# Patient Record
Sex: Female | Born: 1950
Health system: Southern US, Community
[De-identification: ages and names within clinical notes are randomized; demographics above are authoritative.]

## PROBLEM LIST (undated history)

## (undated) DIAGNOSIS — I509 Heart failure, unspecified: Secondary | ICD-10-CM

## (undated) DIAGNOSIS — R011 Cardiac murmur, unspecified: Secondary | ICD-10-CM

## (undated) DIAGNOSIS — R519 Headache, unspecified: Secondary | ICD-10-CM

## (undated) DIAGNOSIS — E1140 Type 2 diabetes mellitus with diabetic neuropathy, unspecified: Secondary | ICD-10-CM

## (undated) DIAGNOSIS — R51 Headache: Secondary | ICD-10-CM

## (undated) DIAGNOSIS — I739 Peripheral vascular disease, unspecified: Secondary | ICD-10-CM

## (undated) DIAGNOSIS — M797 Fibromyalgia: Secondary | ICD-10-CM

## (undated) DIAGNOSIS — IMO0001 Reserved for inherently not codable concepts without codable children: Secondary | ICD-10-CM

## (undated) DIAGNOSIS — K219 Gastro-esophageal reflux disease without esophagitis: Secondary | ICD-10-CM

## (undated) DIAGNOSIS — M6740 Ganglion, unspecified site: Secondary | ICD-10-CM

## (undated) DIAGNOSIS — J449 Chronic obstructive pulmonary disease, unspecified: Secondary | ICD-10-CM

## (undated) DIAGNOSIS — Z9641 Presence of insulin pump (external) (internal): Secondary | ICD-10-CM

## (undated) DIAGNOSIS — J9690 Respiratory failure, unspecified, unspecified whether with hypoxia or hypercapnia: Secondary | ICD-10-CM

## (undated) DIAGNOSIS — I10 Essential (primary) hypertension: Secondary | ICD-10-CM

## (undated) DIAGNOSIS — E119 Type 2 diabetes mellitus without complications: Secondary | ICD-10-CM

## (undated) HISTORY — PX: MULTIPLE TOOTH EXTRACTIONS: SHX2053

## (undated) HISTORY — PX: CARPAL TUNNEL RELEASE: SHX101

## (undated) HISTORY — PX: FEMORAL-FEMORAL BYPASS GRAFT: SHX936

---

## 2000-11-21 ENCOUNTER — Encounter: Admission: RE | Admit: 2000-11-21 | Discharge: 2000-11-21 | Payer: Self-pay | Admitting: Obstetrics & Gynecology

## 2001-04-05 ENCOUNTER — Encounter (INDEPENDENT_AMBULATORY_CARE_PROVIDER_SITE_OTHER): Payer: Self-pay | Admitting: *Deleted

## 2001-04-05 ENCOUNTER — Encounter: Admission: RE | Admit: 2001-04-05 | Discharge: 2001-04-05 | Payer: Self-pay | Admitting: *Deleted

## 2001-04-05 ENCOUNTER — Other Ambulatory Visit: Admission: RE | Admit: 2001-04-05 | Discharge: 2001-04-05 | Payer: Self-pay | Admitting: *Deleted

## 2001-04-06 ENCOUNTER — Encounter: Admission: RE | Admit: 2001-04-06 | Discharge: 2001-04-06 | Payer: Self-pay | Admitting: Internal Medicine

## 2001-04-16 ENCOUNTER — Ambulatory Visit (HOSPITAL_COMMUNITY): Admission: RE | Admit: 2001-04-16 | Discharge: 2001-04-16 | Payer: Self-pay | Admitting: *Deleted

## 2001-05-08 ENCOUNTER — Encounter: Admission: RE | Admit: 2001-05-08 | Discharge: 2001-05-08 | Payer: Self-pay | Admitting: *Deleted

## 2001-06-22 ENCOUNTER — Encounter: Payer: Self-pay | Admitting: Internal Medicine

## 2001-06-22 ENCOUNTER — Encounter: Admission: RE | Admit: 2001-06-22 | Discharge: 2001-06-22 | Payer: Self-pay | Admitting: Internal Medicine

## 2001-06-22 ENCOUNTER — Ambulatory Visit (HOSPITAL_COMMUNITY): Admission: RE | Admit: 2001-06-22 | Discharge: 2001-06-22 | Payer: Self-pay | Admitting: Internal Medicine

## 2001-08-07 ENCOUNTER — Encounter (INDEPENDENT_AMBULATORY_CARE_PROVIDER_SITE_OTHER): Payer: Self-pay | Admitting: *Deleted

## 2001-08-07 ENCOUNTER — Encounter (INDEPENDENT_AMBULATORY_CARE_PROVIDER_SITE_OTHER): Payer: Self-pay | Admitting: Internal Medicine

## 2001-08-07 ENCOUNTER — Encounter: Admission: RE | Admit: 2001-08-07 | Discharge: 2001-08-07 | Payer: Self-pay | Admitting: *Deleted

## 2001-09-05 ENCOUNTER — Encounter: Admission: RE | Admit: 2001-09-05 | Discharge: 2001-09-05 | Payer: Self-pay | Admitting: Internal Medicine

## 2001-10-03 ENCOUNTER — Inpatient Hospital Stay (HOSPITAL_COMMUNITY): Admission: RE | Admit: 2001-10-03 | Discharge: 2001-10-08 | Payer: Self-pay | Admitting: *Deleted

## 2001-10-03 ENCOUNTER — Encounter (INDEPENDENT_AMBULATORY_CARE_PROVIDER_SITE_OTHER): Payer: Self-pay

## 2001-10-12 ENCOUNTER — Inpatient Hospital Stay (HOSPITAL_COMMUNITY): Admission: AD | Admit: 2001-10-12 | Discharge: 2001-10-12 | Payer: Self-pay | Admitting: *Deleted

## 2001-10-18 ENCOUNTER — Encounter: Admission: RE | Admit: 2001-10-18 | Discharge: 2001-10-18 | Payer: Self-pay | Admitting: Internal Medicine

## 2001-10-22 ENCOUNTER — Inpatient Hospital Stay (HOSPITAL_COMMUNITY): Admission: AD | Admit: 2001-10-22 | Discharge: 2001-10-22 | Payer: Self-pay | Admitting: Obstetrics and Gynecology

## 2001-12-06 ENCOUNTER — Encounter: Admission: RE | Admit: 2001-12-06 | Discharge: 2002-03-06 | Payer: Self-pay

## 2002-01-18 ENCOUNTER — Encounter: Admission: RE | Admit: 2002-01-18 | Discharge: 2002-01-18 | Payer: Self-pay | Admitting: *Deleted

## 2002-04-10 ENCOUNTER — Encounter: Admission: RE | Admit: 2002-04-10 | Discharge: 2002-07-09 | Payer: Self-pay

## 2002-05-02 ENCOUNTER — Encounter: Admission: RE | Admit: 2002-05-02 | Discharge: 2002-05-02 | Payer: Self-pay | Admitting: Internal Medicine

## 2002-05-06 ENCOUNTER — Ambulatory Visit (HOSPITAL_COMMUNITY): Admission: RE | Admit: 2002-05-06 | Discharge: 2002-05-06 | Payer: Self-pay | Admitting: Internal Medicine

## 2002-05-09 ENCOUNTER — Ambulatory Visit (HOSPITAL_COMMUNITY): Admission: RE | Admit: 2002-05-09 | Discharge: 2002-05-09 | Payer: Self-pay | Admitting: Internal Medicine

## 2002-05-23 ENCOUNTER — Encounter: Admission: RE | Admit: 2002-05-23 | Discharge: 2002-05-23 | Payer: Self-pay | Admitting: Internal Medicine

## 2002-08-30 ENCOUNTER — Encounter: Admission: RE | Admit: 2002-08-30 | Discharge: 2002-08-30 | Payer: Self-pay | Admitting: Internal Medicine

## 2002-10-16 ENCOUNTER — Encounter: Admission: RE | Admit: 2002-10-16 | Discharge: 2003-01-14 | Payer: Self-pay

## 2002-11-07 ENCOUNTER — Encounter: Admission: RE | Admit: 2002-11-07 | Discharge: 2002-11-07 | Payer: Self-pay | Admitting: Internal Medicine

## 2002-11-26 ENCOUNTER — Encounter: Admission: RE | Admit: 2002-11-26 | Discharge: 2002-11-26 | Payer: Self-pay | Admitting: Internal Medicine

## 2002-11-26 ENCOUNTER — Encounter (INDEPENDENT_AMBULATORY_CARE_PROVIDER_SITE_OTHER): Payer: Self-pay | Admitting: Internal Medicine

## 2003-03-23 ENCOUNTER — Emergency Department (HOSPITAL_COMMUNITY): Admission: EM | Admit: 2003-03-23 | Discharge: 2003-03-23 | Payer: Self-pay | Admitting: Emergency Medicine

## 2003-05-05 ENCOUNTER — Encounter: Admission: RE | Admit: 2003-05-05 | Discharge: 2003-08-03 | Payer: Self-pay

## 2003-05-08 ENCOUNTER — Encounter: Admission: RE | Admit: 2003-05-08 | Discharge: 2003-05-08 | Payer: Self-pay | Admitting: Internal Medicine

## 2004-05-05 ENCOUNTER — Ambulatory Visit: Payer: Self-pay | Admitting: Internal Medicine

## 2004-05-21 ENCOUNTER — Ambulatory Visit: Payer: Self-pay | Admitting: Internal Medicine

## 2004-06-04 ENCOUNTER — Ambulatory Visit: Payer: Self-pay | Admitting: Internal Medicine

## 2004-06-08 ENCOUNTER — Encounter: Admission: RE | Admit: 2004-06-08 | Discharge: 2004-09-06 | Payer: Self-pay | Admitting: Internal Medicine

## 2004-07-29 ENCOUNTER — Inpatient Hospital Stay (HOSPITAL_COMMUNITY): Admission: EM | Admit: 2004-07-29 | Discharge: 2004-07-30 | Payer: Self-pay | Admitting: Emergency Medicine

## 2004-07-29 ENCOUNTER — Encounter: Payer: Self-pay | Admitting: Emergency Medicine

## 2004-08-02 ENCOUNTER — Inpatient Hospital Stay (HOSPITAL_COMMUNITY): Admission: AD | Admit: 2004-08-02 | Discharge: 2004-08-05 | Payer: Self-pay | Admitting: Vascular Surgery

## 2004-09-03 ENCOUNTER — Ambulatory Visit: Payer: Self-pay | Admitting: Internal Medicine

## 2005-04-28 ENCOUNTER — Encounter (INDEPENDENT_AMBULATORY_CARE_PROVIDER_SITE_OTHER): Payer: Self-pay | Admitting: Internal Medicine

## 2005-04-28 ENCOUNTER — Ambulatory Visit (HOSPITAL_COMMUNITY): Admission: RE | Admit: 2005-04-28 | Discharge: 2005-04-28 | Payer: Self-pay | Admitting: Internal Medicine

## 2005-04-28 ENCOUNTER — Ambulatory Visit: Payer: Self-pay | Admitting: Internal Medicine

## 2005-05-12 ENCOUNTER — Ambulatory Visit: Payer: Self-pay | Admitting: Internal Medicine

## 2005-05-17 ENCOUNTER — Ambulatory Visit (HOSPITAL_COMMUNITY): Admission: RE | Admit: 2005-05-17 | Discharge: 2005-05-17 | Payer: Self-pay | Admitting: Internal Medicine

## 2005-05-17 ENCOUNTER — Encounter (INDEPENDENT_AMBULATORY_CARE_PROVIDER_SITE_OTHER): Payer: Self-pay | Admitting: Internal Medicine

## 2005-05-25 ENCOUNTER — Ambulatory Visit: Payer: Self-pay

## 2005-05-26 ENCOUNTER — Ambulatory Visit: Payer: Self-pay | Admitting: Internal Medicine

## 2005-06-16 ENCOUNTER — Ambulatory Visit: Payer: Self-pay | Admitting: Internal Medicine

## 2005-06-28 ENCOUNTER — Ambulatory Visit: Payer: Self-pay | Admitting: Internal Medicine

## 2005-07-19 ENCOUNTER — Encounter (INDEPENDENT_AMBULATORY_CARE_PROVIDER_SITE_OTHER): Payer: Self-pay | Admitting: Specialist

## 2005-07-19 ENCOUNTER — Ambulatory Visit: Payer: Self-pay | Admitting: Internal Medicine

## 2005-10-03 ENCOUNTER — Ambulatory Visit: Payer: Self-pay | Admitting: Hospitalist

## 2005-10-03 ENCOUNTER — Ambulatory Visit (HOSPITAL_COMMUNITY): Admission: RE | Admit: 2005-10-03 | Discharge: 2005-10-03 | Payer: Self-pay | Admitting: Hospitalist

## 2005-10-04 ENCOUNTER — Ambulatory Visit: Payer: Self-pay | Admitting: Internal Medicine

## 2005-10-12 ENCOUNTER — Ambulatory Visit: Payer: Self-pay | Admitting: Internal Medicine

## 2005-11-25 ENCOUNTER — Encounter (INDEPENDENT_AMBULATORY_CARE_PROVIDER_SITE_OTHER): Payer: Self-pay | Admitting: Internal Medicine

## 2005-11-25 ENCOUNTER — Ambulatory Visit: Payer: Self-pay | Admitting: Internal Medicine

## 2005-11-25 LAB — CONVERTED CEMR LAB
Calcium: 10.6 mg/dL — ABNORMAL HIGH (ref 8.4–10.5)
Sodium: 139 meq/L (ref 135–145)

## 2005-11-30 ENCOUNTER — Encounter (INDEPENDENT_AMBULATORY_CARE_PROVIDER_SITE_OTHER): Payer: Self-pay | Admitting: Internal Medicine

## 2005-11-30 ENCOUNTER — Ambulatory Visit: Payer: Self-pay | Admitting: Internal Medicine

## 2005-11-30 LAB — CONVERTED CEMR LAB
Calcium: 10 mg/dL (ref 8.4–10.5)
Glucose, Bld: 126 mg/dL — ABNORMAL HIGH (ref 70–99)
Potassium: 3.3 meq/L — ABNORMAL LOW (ref 3.5–5.3)
Sodium: 138 meq/L (ref 135–145)

## 2005-12-21 DIAGNOSIS — M25559 Pain in unspecified hip: Secondary | ICD-10-CM

## 2005-12-21 DIAGNOSIS — E119 Type 2 diabetes mellitus without complications: Secondary | ICD-10-CM

## 2005-12-21 DIAGNOSIS — F17200 Nicotine dependence, unspecified, uncomplicated: Secondary | ICD-10-CM

## 2005-12-21 DIAGNOSIS — G2581 Restless legs syndrome: Secondary | ICD-10-CM | POA: Insufficient documentation

## 2005-12-21 DIAGNOSIS — K219 Gastro-esophageal reflux disease without esophagitis: Secondary | ICD-10-CM | POA: Insufficient documentation

## 2005-12-21 DIAGNOSIS — G5600 Carpal tunnel syndrome, unspecified upper limb: Secondary | ICD-10-CM

## 2005-12-21 DIAGNOSIS — I739 Peripheral vascular disease, unspecified: Secondary | ICD-10-CM

## 2005-12-21 DIAGNOSIS — M1990 Unspecified osteoarthritis, unspecified site: Secondary | ICD-10-CM | POA: Insufficient documentation

## 2005-12-21 DIAGNOSIS — E782 Mixed hyperlipidemia: Secondary | ICD-10-CM

## 2005-12-21 DIAGNOSIS — I10 Essential (primary) hypertension: Secondary | ICD-10-CM | POA: Insufficient documentation

## 2006-02-23 DIAGNOSIS — Z9189 Other specified personal risk factors, not elsewhere classified: Secondary | ICD-10-CM | POA: Insufficient documentation

## 2006-05-02 ENCOUNTER — Telehealth: Payer: Self-pay | Admitting: *Deleted

## 2006-05-25 ENCOUNTER — Encounter (INDEPENDENT_AMBULATORY_CARE_PROVIDER_SITE_OTHER): Payer: Self-pay | Admitting: Internal Medicine

## 2006-05-25 ENCOUNTER — Ambulatory Visit: Payer: Self-pay | Admitting: Internal Medicine

## 2006-05-25 DIAGNOSIS — M79609 Pain in unspecified limb: Secondary | ICD-10-CM | POA: Insufficient documentation

## 2006-05-25 LAB — CONVERTED CEMR LAB
Blood Glucose, Fingerstick: 191
Hgb A1c MFr Bld: 7.8 %

## 2006-05-26 LAB — CONVERTED CEMR LAB
ALT: 20 units/L (ref 0–35)
Alkaline Phosphatase: 76 units/L (ref 39–117)
Creatinine, Ser: 1.02 mg/dL (ref 0.40–1.20)
Glucose, Bld: 167 mg/dL — ABNORMAL HIGH (ref 70–99)
HDL: 48 mg/dL (ref >39)
LDL Cholesterol: 90 mg/dL (ref 0–99)
Sodium: 138 meq/L (ref 135–145)
Total Bilirubin: 0.6 mg/dL (ref 0.3–1.2)
Total CHOL/HDL Ratio: 3.6
Total CK: 470 units/L — ABNORMAL HIGH (ref 7–177)
Triglycerides: 164 mg/dL — ABNORMAL HIGH (ref <150)
VLDL: 33 mg/dL (ref 0–40)

## 2006-06-23 ENCOUNTER — Encounter (INDEPENDENT_AMBULATORY_CARE_PROVIDER_SITE_OTHER): Payer: Self-pay | Admitting: *Deleted

## 2006-08-30 ENCOUNTER — Telehealth: Payer: Self-pay | Admitting: *Deleted

## 2006-08-31 ENCOUNTER — Ambulatory Visit: Payer: Self-pay | Admitting: Internal Medicine

## 2006-08-31 ENCOUNTER — Encounter (INDEPENDENT_AMBULATORY_CARE_PROVIDER_SITE_OTHER): Payer: Self-pay | Admitting: Hospitalist

## 2006-08-31 LAB — CONVERTED CEMR LAB
BUN: 11 mg/dL (ref 6–23)
CO2: 21 meq/L (ref 19–32)
Calcium: 8.9 mg/dL (ref 8.4–10.5)
Chloride: 107 meq/L (ref 96–112)
Glucose, Bld: 148 mg/dL — ABNORMAL HIGH (ref 70–99)

## 2006-10-06 ENCOUNTER — Telehealth (INDEPENDENT_AMBULATORY_CARE_PROVIDER_SITE_OTHER): Payer: Self-pay | Admitting: Pharmacy Technician

## 2006-10-09 ENCOUNTER — Encounter (INDEPENDENT_AMBULATORY_CARE_PROVIDER_SITE_OTHER): Payer: Self-pay | Admitting: Internal Medicine

## 2006-10-09 ENCOUNTER — Ambulatory Visit: Payer: Self-pay | Admitting: Internal Medicine

## 2006-10-09 LAB — CONVERTED CEMR LAB
Blood Glucose, Fingerstick: 168
Potassium: 3.9 meq/L (ref 3.5–5.3)
Sed Rate: 58 mm/hr — ABNORMAL HIGH (ref 0–22)
Sodium: 141 meq/L (ref 135–145)

## 2006-10-12 ENCOUNTER — Emergency Department (HOSPITAL_COMMUNITY): Admission: EM | Admit: 2006-10-12 | Discharge: 2006-10-12 | Payer: Self-pay | Admitting: Emergency Medicine

## 2006-10-26 ENCOUNTER — Ambulatory Visit: Payer: Self-pay | Admitting: Internal Medicine

## 2006-10-26 ENCOUNTER — Encounter (INDEPENDENT_AMBULATORY_CARE_PROVIDER_SITE_OTHER): Payer: Self-pay | Admitting: *Deleted

## 2006-10-26 LAB — CONVERTED CEMR LAB: Blood Glucose, Fingerstick: 141

## 2006-11-14 ENCOUNTER — Telehealth (INDEPENDENT_AMBULATORY_CARE_PROVIDER_SITE_OTHER): Payer: Self-pay | Admitting: Pharmacy Technician

## 2006-12-25 ENCOUNTER — Telehealth (INDEPENDENT_AMBULATORY_CARE_PROVIDER_SITE_OTHER): Payer: Self-pay | Admitting: *Deleted

## 2007-01-30 ENCOUNTER — Telehealth (INDEPENDENT_AMBULATORY_CARE_PROVIDER_SITE_OTHER): Payer: Self-pay | Admitting: Internal Medicine

## 2007-02-27 ENCOUNTER — Telehealth (INDEPENDENT_AMBULATORY_CARE_PROVIDER_SITE_OTHER): Payer: Self-pay | Admitting: *Deleted

## 2007-03-01 ENCOUNTER — Encounter (INDEPENDENT_AMBULATORY_CARE_PROVIDER_SITE_OTHER): Payer: Self-pay | Admitting: Internal Medicine

## 2007-03-02 ENCOUNTER — Encounter (INDEPENDENT_AMBULATORY_CARE_PROVIDER_SITE_OTHER): Payer: Self-pay | Admitting: Internal Medicine

## 2007-03-05 ENCOUNTER — Encounter (INDEPENDENT_AMBULATORY_CARE_PROVIDER_SITE_OTHER): Payer: Self-pay | Admitting: Internal Medicine

## 2007-03-05 ENCOUNTER — Ambulatory Visit: Payer: Self-pay | Admitting: Internal Medicine

## 2007-03-07 ENCOUNTER — Telehealth (INDEPENDENT_AMBULATORY_CARE_PROVIDER_SITE_OTHER): Payer: Self-pay | Admitting: Internal Medicine

## 2007-03-13 ENCOUNTER — Telehealth (INDEPENDENT_AMBULATORY_CARE_PROVIDER_SITE_OTHER): Payer: Self-pay | Admitting: Internal Medicine

## 2007-03-21 ENCOUNTER — Ambulatory Visit: Payer: Self-pay | Admitting: Internal Medicine

## 2007-03-21 LAB — CONVERTED CEMR LAB: Insulin/Carbohydrate Ratio: 1

## 2007-03-29 ENCOUNTER — Encounter (INDEPENDENT_AMBULATORY_CARE_PROVIDER_SITE_OTHER): Payer: Self-pay | Admitting: Internal Medicine

## 2007-04-12 ENCOUNTER — Telehealth (INDEPENDENT_AMBULATORY_CARE_PROVIDER_SITE_OTHER): Payer: Self-pay | Admitting: *Deleted

## 2007-04-26 ENCOUNTER — Telehealth (INDEPENDENT_AMBULATORY_CARE_PROVIDER_SITE_OTHER): Payer: Self-pay | Admitting: Internal Medicine

## 2007-05-11 ENCOUNTER — Telehealth (INDEPENDENT_AMBULATORY_CARE_PROVIDER_SITE_OTHER): Payer: Self-pay | Admitting: *Deleted

## 2007-05-18 ENCOUNTER — Emergency Department (HOSPITAL_COMMUNITY): Admission: EM | Admit: 2007-05-18 | Discharge: 2007-05-18 | Payer: Self-pay | Admitting: Emergency Medicine

## 2007-05-21 ENCOUNTER — Telehealth (INDEPENDENT_AMBULATORY_CARE_PROVIDER_SITE_OTHER): Payer: Self-pay | Admitting: Internal Medicine

## 2007-05-30 ENCOUNTER — Telehealth: Payer: Self-pay | Admitting: *Deleted

## 2007-06-05 ENCOUNTER — Telehealth (INDEPENDENT_AMBULATORY_CARE_PROVIDER_SITE_OTHER): Payer: Self-pay | Admitting: *Deleted

## 2007-06-06 ENCOUNTER — Encounter (INDEPENDENT_AMBULATORY_CARE_PROVIDER_SITE_OTHER): Payer: Self-pay | Admitting: Internal Medicine

## 2007-06-08 ENCOUNTER — Encounter (INDEPENDENT_AMBULATORY_CARE_PROVIDER_SITE_OTHER): Payer: Self-pay | Admitting: Internal Medicine

## 2007-12-07 ENCOUNTER — Encounter (INDEPENDENT_AMBULATORY_CARE_PROVIDER_SITE_OTHER): Payer: Self-pay | Admitting: Internal Medicine

## 2009-03-09 ENCOUNTER — Emergency Department (HOSPITAL_COMMUNITY): Admission: EM | Admit: 2009-03-09 | Discharge: 2009-03-09 | Payer: Self-pay | Admitting: Emergency Medicine

## 2010-02-25 ENCOUNTER — Ambulatory Visit (HOSPITAL_COMMUNITY)
Admission: RE | Admit: 2010-02-25 | Discharge: 2010-02-25 | Payer: Self-pay | Source: Home / Self Care | Attending: Internal Medicine | Admitting: Internal Medicine

## 2010-03-07 ENCOUNTER — Encounter: Payer: Self-pay | Admitting: Internal Medicine

## 2010-07-02 NOTE — Discharge Summary (Signed)
NAMEMANDY, PEEKS NO.:  [id number]   MEDICAL RECORD NO.:  [id number]          PATIENT TYPE:  OIB   LOCATION:  2029                         FACILITY:  [facility]   PHYSICIAN:  Quita Skye. Hart Rochester, M.D.  DATE OF BIRTH:  Aug 18, 1950   DATE OF ADMISSION:  08/02/2004  DATE OF DISCHARGE:  08/05/2004                                 DISCHARGE SUMMARY   PRIMARY ADMITTING DIAGNOSIS:  Left leg pain and numbness.   DISCHARGE DIAGNOSES:  1.  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  2.  Peripheral vascular occlusive disease, status post previous femoral to      popliteal bypass in 1997.  3.  Insulin dependent diabetes mellitus type 2 on insulin pump.  4.  History of hypertension.  5.  Arthritis.  6.  Carpal tunnel syndrome.   PROCEDURES PERFORMED:  1.  Left femoral endarterectomy with Dacron patch angioplasty.  2.  Profundoplasty.  3.  Left femoral to above-knee popliteal bypass with 6 mm Gore-Tex graft.  4.  Intraoperative arteriogram.   HISTORY:  The patient is a 60 year old white female with history of  peripheral vascular occlusive disease who is status post previous femoral to  popliteal bypass in 1997. She presented to [facility] complaining  of two-day history of numbness, pain and temperature change in her left  lower extremity. There she was found to have what appeared to be embolic  ischemic left lower extremity. Because of this finding and her previous  history of peripheral revascularization, she was transferred to [facility] for a vascular surgery consult.   HOSPITAL COURSE:  The patient was transferred to [facility] and was  seen by Dr. Jerilee Field for CVTS. She underwent ankle-brachial indices  which were diminished on the left at 0.28. She had a cold ischemic-appearing  left lower extremity on physical exam. It was Dr. Candie Chroman opinion that she  should be taken to the operating room for exploration and  possible femoral  to popliteal bypass. She was able to be taken to the operating room where  she underwent a left femoral endarterectomy with Dacron patch angioplasty  and profundoplasty and left femoral to popliteal bypass as described in  detail above. She tolerated procedure well and was transferred from the  recovery room to the floor in stable condition.   Postoperatively, she has done well. Internal medicine consult was obtained  for help in managing her insulin pump. Teaching service saw the patient and  made adjustments in her insulin doses. Otherwise she has done well. She was  slowly mobilized and at the time of discharge was ambulating independently  without difficulty. She did maintain palpable 2+ dorsalis pedis and  posterior tibial pulses throughout her entire postoperative course. Her  surgical incision sites were healing well. Her foot was warm and well  perfused and motor and sensory function were intact. She remained afebrile  and all vital signs have been stable postoperatively.   Her labs on the date of discharge show hemoglobin 12.1, hematocrit 35.9,  white count 10.2, platelets  313,000. Sodium 133, potassium 3.7, BUN 12,  creatinine 1.0. Postoperative ankle-brachial indices were improved at 0.73  on the left. It was felt that since she was progressing well and had no  postoperative complications, she could be discharged home on Jul 05, 2004.   DISCHARGE MEDICATIONS:  1.  Tylox one to two q.4h. p.r.n. for pain.  2.  Insulin pump settings as directed by teaching service.  3.  Enalapril 20 milligrams daily.  4.  Triamterene/HCTZ 37.5/25 mg daily.  5.  Neurontin 600 milligrams daily.  6.  Wellbutrin XL 150 milligrams q.d.   DISCHARGE INSTRUCTIONS:  She was asked to refrain from driving, heavy  lifting or strenuous activity. She may continue ambulating daily and using  her incentive spirometer. She may shower daily and clean her incisions with  soap and water. She  will continue her same preoperative diet.   DISCHARGE FOLLOWUP:  She was asked to follow up with her primary care  physician in the next 1-2 weeks regarding her insulin pump. The CVTS office  will contact the patient with follow-up appointment to see Dr. Hart Rochester in  three weeks. She will have repeat ABIs at that visit. She was instructed to  call our office in the interim if she experiences any problems or has  questions.      Coral Ceo, P.A.    ______________________________  Quita Skye Hart Rochester, M.D.    GC/MEDQ  D:  10/07/2004  T:  10/08/2004  Job:  [id number]   cc:   [facility]

## 2010-07-02 NOTE — H&P (Signed)
NAMEBRONDA, Katherine Williams                 ACCOUNT NO.:  [id number]   MEDICAL RECORD NO.:  [id number]          PATIENT TYPE:  INP   LOCATION:  5735                         FACILITY:  [facility]   PHYSICIAN:  Larina Earthly, M.D.    DATE OF BIRTH:  1950-04-06   DATE OF ADMISSION:  07/29/2004  DATE OF DISCHARGE:                                HISTORY & PHYSICAL   ADMITTING DIAGNOSES:  Increased ischemia of left lower extremity.   HISTORY OF PRESENT ILLNESS:  Patient is a 60 year old black female with a  long history of peripheral vascular occlusive disease.  She presented to the  [facility] Emergency Room with increasing pain in her left foot which had  been present for two days.  She was able to walk and had motor and sensory  function intact.  There was also some concern of some chest tightness at  [facility] and therefore underwent CAT scan of her chest which  showed no evidence of pulmonary embolus.  She underwent noninvasive vascular  laboratory studies at [facility] with an __________ index of 0.48  on the right and 0.28 on the left.  Transferred for further evaluation to  [facility].  Patient had a prior ischemic right leg and underwent  right femoral to popliteal bypass in 1997.   ALLERGIES:  SULFA.   MEDICATIONS:  1.  Hydrochlorothiazide 25 mg combination of triamterene 37.5 mg two p.o.      daily.  2.  Neurontin 300 mg p.o. b.i.d.  3.  Wellbutrin 150 mg p.o. daily.  4.  Enalapril 20 mg p.o. daily.  5.  Patient is on an insulin pump Humulin NPH insulin.   SOCIAL HISTORY:  Smokes one pack of cigarettes per day.  Does not drink  alcohol.   REVIEW OF SYSTEMS:  Otherwise positive for arthritis.   PAST SURGICAL HISTORY:  Significant for hysterectomy in the past.   PHYSICAL EXAMINATION:  VITAL SIGNS:  Temperature 97.8, blood pressure  160/67, heart rate 93, respirations 18, saturations 98%.  GENERAL:  She is a well-developed, well-nourished black female  appearing  stated age of 69 in mild to moderate distress from her left foot pain.  She  is grossly intact neurologically.  CHEST:  Clear bilaterally.  HEART:  Regular rate and rhythm.  EXTREMITIES:  Radial pulses are 2+ bilaterally.  She had 2+ femoral pulses  bilaterally.  I do not palpate popliteal pulses bilaterally.  She had a  palpable 1+ right dorsalis pedis pulse and absent pedal pulses on the left.   LABORATORY DATA:  Creatinine 1.  PT/PTT normal.  White blood cell count 9.3,  hemoglobin 14, hematocrit 41.   IMPRESSION:  Worsening chronic left lower extremity arterial insufficiency.   PLAN:  The patient will be admitted for hydration, pain control, and  scheduled for arteriography for further evaluation in the radiology  department tomorrow morning.       TFE/MEDQ  D:  07/29/2004  T:  07/30/2004  Job:  [id number]

## 2010-07-02 NOTE — Op Note (Signed)
Katherine Williams, Katherine Williams                 ACCOUNT NO.:  [id number]   MEDICAL RECORD NO.:  [id number]          PATIENT TYPE:  INP   LOCATION:  5735                         FACILITY:  [facility]   PHYSICIAN:  Larina Earthly, M.D.    DATE OF BIRTH:  April 07, 1950   DATE OF PROCEDURE:  DATE OF DISCHARGE:                                 OPERATIVE REPORT   NO DICTATION.       TFE/MEDQ  D:  07/29/2004  T:  07/30/2004  Job:  [id number]

## 2010-07-02 NOTE — Op Note (Signed)
Katherine Williams, Katherine Williams                 ACCOUNT NO.:  [id number]   MEDICAL RECORD NO.:  [id number]          PATIENT TYPE:  OIB   LOCATION:  2899                         FACILITY:  [facility]   PHYSICIAN:  Quita Skye. Hart Rochester, M.D.  DATE OF BIRTH:  [birth year]   DATE OF PROCEDURE:  DATE OF DISCHARGE:                                 OPERATIVE REPORT   PREOPERATIVE DIAGNOSIS:  Ischemic left leg secondary to common femoral  occlusion and superficial femoral occlusion.   POSTOPERATIVE DIAGNOSIS:  Ischemic left leg secondary to common femoral  occlusion and superficial femoral occlusion.   OPERATION:  1.  Left common femoral endarterectomy with Dacron patch angioplasty and      profundoplasty.  2.  Left common femoral to popliteal (above-knee) bypass using a 6-mm Gore-      Tex graft.  3.  Intraoperative arteriogram.   SURGEON:  Dr. Hart Rochester.   FIRST ASSISTANT:  Nurse.   ANESTHESIA:  General endotracheal.   PROCEDURE:  The patient was taken to the operating room, placed in the  supine position at which time satisfactory general endotracheal anesthesia  was administered. Left leg was prepped Betadine scrub and solution and  draped in routine sterile manner. Longitudinal incision was made in the  inguinal region carried down subcutaneous tissue.  The common, superficial  and profunda femoris arteries dissected free. The common femoral was  pulseless but there was a pulse at the level the inguinal ligament the  external iliac artery was exposed beneath the inguinal ligament up about 5-6  cm proximally. Profunda was dissected free down passed the secondary  branches where it was a fairly soft vessel but pulseless. Superficial  femoral artery was diffusely diseased and known to be occluded in the  midthigh. The patient was then heparinized. The external iliac artery  occluded proximally and femoral vessels distally. Longitudinal opening made  in the common femoral artery with a 15 blade, extended with  Potts scissors  up into the distal external iliac artery. It also was extended distally into  the profunda for about 3-4 cm. There was localized plaque which was easily  endarterectomized but not into the deep plane and removed up to the clamp  and the external iliac artery and down into the proximal profunda. There was  excellent backbleeding from the profunda and three and a half dilator would  pass distally, although there was a posterior plaque extending distally.  Following this, a Dacron patch was sewn into place with a 5-0 Prolene. The  clamps released and there was a good pulse in the profunda femoris artery  and good Doppler flow. However, there was no audible Doppler flow in the  dorsalis pedis or posterior tibial artery at that time. It was decided to  proceed with femoral-popliteal graft.  The popliteal artery was exposed in  the above-knee position through a medial incision being careful to avoid  injury to the saphenous vein. Popliteal artery was dissected free proximally  and distally, was relatively free of disease at this point. A 6-mm Gore-Tex  graft was delivered through the tunnel. An additional  2000 units of heparin  given intravenously. Popliteal artery was occluded proximally and distally  with vessel loops, opened 15 blade, extended with Potts scissors, would  easily accept a 3.5 to 4 mm dilator. Gore-Tex was spatulated and anastomosed  end-to-side with 6-0 Prolene. Following this, the proximal end of the [facility] was anastomosed end-to-side to the patch with 6-0 Prolene.  Clamps  released. There was excellent pulse in the graft and good Doppler flow on  the foot. Intraoperative arteriogram revealed a  widely patent anastomosis with three-vessel runoff. Protamine given to  reverse the heparin. Following adequate hemostasis, the wound was irrigated  with saline, closed in layers with Vicryl in a subcuticular fashion. Sterile  dressing applied. The patient taken to  recovery in satisfactory condition.       JDL/MEDQ  D:  08/02/2004  T:  08/02/2004  Job:  [id number]

## 2010-07-02 NOTE — Discharge Summary (Signed)
NAME:  Katherine Williams, Katherine Williams                           ACCOUNT NO.:   MEDICAL RECORD NO.:  [id number]                   PATIENT TYPE:   LOCATION:                                       FACILITY:   PHYSICIAN:  Mary Sella. Orlene Erm, M.D.                 DATE OF BIRTH:  [birth year]   DATE OF ADMISSION:  10/03/2001  DATE OF DISCHARGE:  10/08/2001                                 DISCHARGE SUMMARY   REASON FOR ADMISSION:  Symptomatic uterine leiomyomata.   PRINCIPAL DIAGNOSES:  Symptomatic uterine leiomyomata.   ADDITIONAL DIAGNOSES:  1. Hypertension.  2. Diabetes mellitus.  3. Peripheral vascular disease.   HISTORY OF PRESENT ILLNESS:  The patient is a 60 year old black female with  a long history of multiple uterine leiomyomata.  She became symptomatic with  this with complaining of heavy vaginal bleeding and pain.  She, in addition,  had a long-standing history of diabetes, hypertension, and peripheral  vascular disease.  The patient was counseled prior to the surgery on the  risks of bleeding, infection, injury to internal organs, and the risk of  transfusion.  In addition, she was counseled extensively about her surgical  risks with her underlying medical diseases.   The patient was admitted October 03, 2001.  Underwent exploratory laparotomy,  total abdominal hysterectomy, bilateral salpingo-oophorectomy.  The patient  tolerated procedure well.  Estimated blood loss for surgery was 350 cc.  She  returned to the recovery room in stable condition.  On postoperative day  number one patient was comfortable and was without nausea.  She was  complaining of hunger.  Her urine output was marginal at 30 cc/hour.  Because of her other underlying medical conditions, she was placed in the  ICU for closer observation.  On postoperative day number two she complained  of copious amounts of serosanguineous fluid draining from her incision after  a coughing episode.  The patient's bandage was removed and  there was  evidence of fascial dehiscence with small bowel at the wound edge.  The  patient was counseled on the need to return to surgery.  She returned to  surgery and underwent closure of her fascial dehiscence.  She tolerated this  procedure well and returned to the recovery room in stable condition.  On  postoperative day number three the patient was slightly distended and had  decreased bowel sounds.  She had not passed gas at this point.  She had a  low grade fever.  She was continued on sips and chips.  On postoperative day  number four the patient began having increased bowel function with passing  flatus and she was afebrile.  She tolerated a regular diet by postoperative  day number five and was ambulating well.  The patient was discharged home on  postoperative day number five in good condition.   DISCHARGE MEDICATIONS:  Atenolol, Percocet.  The patient was to  resume her  home doses of insulin and blood pressure medication.   ACTIVITY:  No heavy lifting for six weeks.    WOUND CARE:  She is to keep her wound clean and dry.   FOLLOW UP:  She is to return the following Friday morning for staple  removal.                                               Mary Sella. Orlene Erm, M.D.    EMH/MEDQ  D:  11/06/2001  T:  11/06/2001  Job:  [id number]

## 2010-07-02 NOTE — Op Note (Signed)
NAME:  Katherine Williams, Katherine Williams                           ACCOUNT NO.:  [id number]   MEDICAL RECORD NO.:  [id number]                   PATIENT TYPE:   LOCATION:                                       FACILITY:  [facility]   PHYSICIAN:  Mary Sella. Orlene Erm, M.D.                 DATE OF BIRTH:  09-08-1950   DATE OF PROCEDURE:  10/05/2001  DATE OF DISCHARGE:                                 OPERATIVE REPORT   PREOPERATIVE DIAGNOSES:  A 60 year old black female status post total  abdominal hysterectomy with vaginal dehiscence.   POSTOPERATIVE DIAGNOSES:  A 60 year old black female status post total  abdominal hysterectomy with vaginal dehiscence.   PROCEDURE:  Wound closure with Smead Jones technique and tension bridges.   SURGEON:  Mary Sella. Orlene Erm, M.D.   ASSISTANT:  ________.   ANESTHESIA:  General endotracheal.   COMPLICATIONS:  None.   ESTIMATED BLOOD LOSS:  Minimal.   DISPOSITION:  Recovery room, stable.   INDICATIONS FOR PROCEDURE:  The patient is a 60 year old black female  postoperative day number two from a total abdominal hysterectomy and  bilateral salpingo-oophorectomy.  The patient complained of large  serosanguineous discharge the morning of postoperative day two after an  episode of heavy coughing.  The patient's dressing was removed and there  appeared to be fascial dehiscence with small bowel at the incision.  The  patient was counseled on risks of surgery including risk of bleeding,  infection, injury to internal organs.  The patient was taken back to the  operating room immediately for closure.   PROCEDURE:  The patient was taken to the operating room where she had  postoperative general endotracheal anesthesia.  She was prepped and draped  in a sterile fashion.  The staples were removed and the skin edges were  opened.  Examination of the fascia revealed a tear in the fascia at the  inferior aspect that extended approximately to mid portion of the incision.  The suture and fascia  were intact above the mid portion of the incision.  However, because the sutures were still loosening, the fascia  had come open  at that portion as well.  The abdomen was explored and irrigated with  copious amounts of saline and genitourinary irrigant.  The incision was made  to close the abdominal incision en masse with modified Smead Jones technique  using loop couple strand 0 PDS.  The fascia was closed from the top to the  mid portion of the vertical incision.  The fascia was dried off.  A second  loop PDS was used from the inferior aspect to mid portion.  Prior to closing  the abdominal incision, stay sutures were placed with wide bites through the  entire abdominal wall with number one Prolene.  The fascia was completely  closed and the  skin edges were reapproximated with staples.  Tension bridges were then  placed at the 4 Prolene sutures and tension was adjusted appropriately.  The  patient was awakened, taken to recovery room in stable condition.  Sponge,  instrument, and needle counts were correct at the end of the procedure.                                               Mary Sella. Orlene Erm, M.D.    EMH/MEDQ  D:  11/16/2001  T:  11/16/2001  Job:  [id number]

## 2010-07-02 NOTE — Op Note (Signed)
NAME:  Katherine Williams, Katherine Williams                           ACCOUNT NO.:  [id number]   MEDICAL RECORD NO.:  [id number]                   PATIENT TYPE:  INP   LOCATION:  9179                                 FACILITY:  [facility]   PHYSICIAN:  Enid Cutter, M.D.                  DATE OF BIRTH:  04-06-1950   DATE OF PROCEDURE:  10/03/2001  DATE OF DISCHARGE:                                 OPERATIVE REPORT   PREOPERATIVE DIAGNOSES:  A 60 year old black female with multiple uterine  leiomyomata and chronic pelvic pain.   POSTOPERATIVE DIAGNOSES:  A 60 year old black female with multiple uterine  leiomyomata and chronic pelvic pain.   PROCEDURE:  Total abdominal hysterectomy and bilateral salpingo-  oophorectomy.   SURGEON:  Enid Cutter, M.D.   ASSISTANTMichele Mcalpine D. Okey Dupre, M.D.   ANESTHESIA:  General endotracheal.   COMPLICATIONS:  None.   ESTIMATED BLOOD LOSS:  300 cc.   SPECIMENS:  Uterus, tubes, and ovaries.   FINDINGS:  Multiple uterine fibroids and normal-appearing tubes and ovaries  bilaterally.   DISPOSITION:  Recovery room stable.   INDICATIONS FOR PROCEDURE:  The patient is a 60 year old black female, who  has a longstanding history of hypertension, diabetes with previous vascular  disease and uterine leiomyomata that has been followed for several years  with known uterine leiomyomata.  She was first evaluated in the spring of  2003 and, at that time, her diabetes and hypertension were poorly  controlled.  She was referred to internal medicine, was able to get her  diabetes and hypertension under better control.  She requested hysterectomy  secondary to painful uterine leiomyomata.  The patient was counseled on the  risks of surgery prior to surgery including risk of bleeding, infection,  injury to internal organs.  The patient was counseled on the risk of  transfusion.  In addition, this patient was counseled on her risk of having  a vascular event as a result of surgery or in the  postoperative period as  well.  The patient strongly desired to proceed with surgery.   DESCRIPTION OF PROCEDURE:  The patient was taken to the operating room.  She  was placed under general endotracheal anesthesia.  A bimanual exam was  performed and revealed an approximately 19-20 weeks size uterus that is  irregular in shape.  There were no definite adnexal masses palpable.  The  patient was prepped and draped in a sterile fashion after being placed in  supine position.  A midline vertical incision was performed with a scalpel  and carried down to the underlying fascia with Bovie.  The fascia was then  entered with Bovie and dissected superiorly and inferiorly.  The rectus  muscle bellies were separated, and the underlying peritoneum was grasped  with a hemostat.  The peritoneum was dissected both superiorly and  inferiorly.  The Balfour retractor was placed in  the incision with moist lap  sponges at the wound edges.  The bowel packing was performed with multiple  moist lap sponges.  The patient was placed in Trendelenburg.  The  visualization of the pelvis was limited secondary to the patient's habitus  and the multiple uterine leiomyomata.  A single-tooth tenaculum was used to  grasp the fundus of the uterus.  The patient's left round ligament was  identified and ligated with a 0 Vicryl suture.  This was then transected  with Bovie, and the peritoneum was dissected along the infundibulopelvic  muscles.  The patient's left ureter was identified and had peristalsis.  Because of the massive uterus, the decision was  made to leave the ovary for  the time being.  The utero-ovarian pedicle was grasped with a Heaney clamp,  and this was transected and ligated with 0 Vicryl sutures.  The peritoneum  was dissected anteriorly along the uterus to create a bladder flap.  The  patient's right round ligament was identified in a similar fashion.  This  was ligated with 0 Vicryl sutures, and the  peritoneum was again dissected  laterally.  The utero-ovarian pedicles identified on this side, and this is  clamped.  This was then transected and ligated with 0 Vicryl sutures.  The  bladder flap was taken down further with greater dissection of the anterior  peritoneum.  The patient's left uterine arteries were identified, and Heaney  clamp was placed on the left vessels.  These pedicles were transected and  ligated with 0 Vicryl suture.  The same procedure was performed with a  Heaney clamp on the right uterine vessels.  These were transected and  ligated.  The bladder flap was taken down further.  A second suture was  placed at the level of the uterine arteries on the left after clamping and  cutting with a Heaney clamp.  After the uterine vessels were secured, the  decision was made to perform a supracervical hysterectomy and remove the  corpus of the uterus.  This allowed greater visualization of the operative  field.  The remaining cervix was grasped with a single-tooth tenaculum.  Alternating bites were taken along the uterine cervix with straight and  curved Heaney clamps.  Once we reached the level of the vagina, curved  Heaney clamps were used to clamp across the uterosacral ligaments.  These  were transected and ligated with 0 Vicryl sutures.  The cervix was  completely excised.  The remaining vaginal cuff was sutured with three  sutures of 0 Vicryl suture.  The pelvis was irrigated with copious amounts  of saline.  All of the pedicles were inspected and noted to be hemostatic.  Attention was then turned to the left ovary which identified and grasped  with a Babcock clamp.  The infundibulopelvic vessels were then clamped with  a Heaney clamp after assuring that the ureters were out of the operative  field.  A free tie was placed at the infundibulopelvic vessels after  excising the left ovary.  There was what appeared to be some small oozing from that pedicle, and it was  regrasped with the Heaney clamp.  A second  free tie was placed as well as a stitch at that pedicle.  No further  evidence of hematoma was identified.  The patient's right ovary was  identified, and the infundibulopelvic vessels were grasped with a Heaney  clamp.  The ovary was excised, and this pedicle was doubly ligated with 0  Vicryl  sutures.  The patient's right ureter was identified, though was not  seen to have peristalsis, was no hydroureter.  The pelvis was once again  irrigated with saline, and all pedicles were inspected and noted to be  hemostatic.  The fascia was then closed after the instruments and bowel  packs were removed.  The fascia was closed with 0 double-stranded PDS.  The  subcutaneous tissues were irrigated with copious amounts of saline, and the  skin was reapproximated with staples.  The patient was returned to the  recovery room in stable condition.  Sponge, needle, and instrument counts  were correct at the end of the procedure.                                               Enid Cutter, M.D.    EMH/MEDQ  D:  10/03/2001  T:  10/04/2001  Job:  [id number]

## 2010-07-13 ENCOUNTER — Encounter: Payer: Self-pay | Admitting: Internal Medicine

## 2011-11-07 ENCOUNTER — Encounter: Payer: Self-pay | Admitting: Internal Medicine

## 2014-02-27 DIAGNOSIS — E1165 Type 2 diabetes mellitus with hyperglycemia: Secondary | ICD-10-CM | POA: Diagnosis not present

## 2014-04-09 DIAGNOSIS — E109 Type 1 diabetes mellitus without complications: Secondary | ICD-10-CM | POA: Diagnosis not present

## 2014-04-16 DIAGNOSIS — E119 Type 2 diabetes mellitus without complications: Secondary | ICD-10-CM | POA: Diagnosis not present

## 2014-04-16 DIAGNOSIS — E782 Mixed hyperlipidemia: Secondary | ICD-10-CM | POA: Diagnosis not present

## 2014-04-18 ENCOUNTER — Other Ambulatory Visit (HOSPITAL_COMMUNITY): Payer: Self-pay | Admitting: Internal Medicine

## 2014-04-18 DIAGNOSIS — M8580 Other specified disorders of bone density and structure, unspecified site: Secondary | ICD-10-CM

## 2014-04-18 DIAGNOSIS — I10 Essential (primary) hypertension: Secondary | ICD-10-CM | POA: Diagnosis not present

## 2014-04-18 DIAGNOSIS — E785 Hyperlipidemia, unspecified: Secondary | ICD-10-CM | POA: Diagnosis not present

## 2014-04-18 DIAGNOSIS — E1165 Type 2 diabetes mellitus with hyperglycemia: Secondary | ICD-10-CM | POA: Diagnosis not present

## 2014-04-18 DIAGNOSIS — Z6837 Body mass index (BMI) 37.0-37.9, adult: Secondary | ICD-10-CM | POA: Diagnosis not present

## 2014-04-18 DIAGNOSIS — F17200 Nicotine dependence, unspecified, uncomplicated: Secondary | ICD-10-CM | POA: Diagnosis not present

## 2014-04-23 ENCOUNTER — Ambulatory Visit (HOSPITAL_COMMUNITY)
Admission: RE | Admit: 2014-04-23 | Discharge: 2014-04-23 | Disposition: A | Discharge status: Home / Self Care | Payer: Medicare Other | Source: Ambulatory Visit | Attending: Internal Medicine | Admitting: Internal Medicine

## 2014-04-23 ENCOUNTER — Other Ambulatory Visit (HOSPITAL_COMMUNITY): Payer: Self-pay

## 2014-04-23 DIAGNOSIS — M8580 Other specified disorders of bone density and structure, unspecified site: Secondary | ICD-10-CM

## 2014-04-23 DIAGNOSIS — M899 Disorder of bone, unspecified: Secondary | ICD-10-CM | POA: Insufficient documentation

## 2014-04-23 DIAGNOSIS — M85852 Other specified disorders of bone density and structure, left thigh: Secondary | ICD-10-CM | POA: Diagnosis not present

## 2014-04-30 DIAGNOSIS — E119 Type 2 diabetes mellitus without complications: Secondary | ICD-10-CM | POA: Diagnosis not present

## 2014-04-30 DIAGNOSIS — H2512 Age-related nuclear cataract, left eye: Secondary | ICD-10-CM | POA: Diagnosis not present

## 2014-04-30 DIAGNOSIS — H2511 Age-related nuclear cataract, right eye: Secondary | ICD-10-CM | POA: Diagnosis not present

## 2014-04-30 DIAGNOSIS — I10 Essential (primary) hypertension: Secondary | ICD-10-CM | POA: Diagnosis not present

## 2014-04-30 DIAGNOSIS — H25011 Cortical age-related cataract, right eye: Secondary | ICD-10-CM | POA: Diagnosis not present

## 2014-06-03 DIAGNOSIS — H2512 Age-related nuclear cataract, left eye: Secondary | ICD-10-CM | POA: Diagnosis not present

## 2014-06-04 DIAGNOSIS — H18411 Arcus senilis, right eye: Secondary | ICD-10-CM | POA: Diagnosis not present

## 2014-06-04 DIAGNOSIS — H25011 Cortical age-related cataract, right eye: Secondary | ICD-10-CM | POA: Diagnosis not present

## 2014-06-04 DIAGNOSIS — H2511 Age-related nuclear cataract, right eye: Secondary | ICD-10-CM | POA: Diagnosis not present

## 2014-06-10 DIAGNOSIS — H2511 Age-related nuclear cataract, right eye: Secondary | ICD-10-CM | POA: Diagnosis not present

## 2014-06-17 DIAGNOSIS — E1165 Type 2 diabetes mellitus with hyperglycemia: Secondary | ICD-10-CM | POA: Diagnosis not present

## 2014-07-02 DIAGNOSIS — Z961 Presence of intraocular lens: Secondary | ICD-10-CM | POA: Diagnosis not present

## 2014-08-20 DIAGNOSIS — E1165 Type 2 diabetes mellitus with hyperglycemia: Secondary | ICD-10-CM | POA: Diagnosis not present

## 2014-08-20 DIAGNOSIS — I10 Essential (primary) hypertension: Secondary | ICD-10-CM | POA: Diagnosis not present

## 2014-08-20 DIAGNOSIS — E782 Mixed hyperlipidemia: Secondary | ICD-10-CM | POA: Diagnosis not present

## 2014-08-22 DIAGNOSIS — I10 Essential (primary) hypertension: Secondary | ICD-10-CM | POA: Diagnosis not present

## 2014-08-22 DIAGNOSIS — E785 Hyperlipidemia, unspecified: Secondary | ICD-10-CM | POA: Diagnosis not present

## 2014-08-22 DIAGNOSIS — E119 Type 2 diabetes mellitus without complications: Secondary | ICD-10-CM | POA: Diagnosis not present

## 2014-09-19 DIAGNOSIS — I10 Essential (primary) hypertension: Secondary | ICD-10-CM | POA: Diagnosis not present

## 2014-09-26 DIAGNOSIS — E1165 Type 2 diabetes mellitus with hyperglycemia: Secondary | ICD-10-CM | POA: Diagnosis not present

## 2015-01-02 ENCOUNTER — Other Ambulatory Visit (HOSPITAL_COMMUNITY): Payer: Self-pay | Admitting: Internal Medicine

## 2015-01-02 DIAGNOSIS — Z1231 Encounter for screening mammogram for malignant neoplasm of breast: Secondary | ICD-10-CM

## 2015-01-06 DIAGNOSIS — E1165 Type 2 diabetes mellitus with hyperglycemia: Secondary | ICD-10-CM | POA: Diagnosis not present

## 2015-01-07 ENCOUNTER — Ambulatory Visit (HOSPITAL_COMMUNITY)
Admission: RE | Admit: 2015-01-07 | Discharge: 2015-01-07 | Disposition: A | Discharge status: Home / Self Care | Payer: Medicare Other | Source: Ambulatory Visit | Attending: Internal Medicine | Admitting: Internal Medicine

## 2015-01-07 DIAGNOSIS — Z1231 Encounter for screening mammogram for malignant neoplasm of breast: Secondary | ICD-10-CM | POA: Diagnosis not present

## 2015-02-04 DIAGNOSIS — E119 Type 2 diabetes mellitus without complications: Secondary | ICD-10-CM | POA: Diagnosis not present

## 2015-02-04 DIAGNOSIS — E782 Mixed hyperlipidemia: Secondary | ICD-10-CM | POA: Diagnosis not present

## 2015-02-04 DIAGNOSIS — I10 Essential (primary) hypertension: Secondary | ICD-10-CM | POA: Diagnosis not present

## 2015-02-17 DIAGNOSIS — E782 Mixed hyperlipidemia: Secondary | ICD-10-CM | POA: Diagnosis not present

## 2015-02-17 DIAGNOSIS — E119 Type 2 diabetes mellitus without complications: Secondary | ICD-10-CM | POA: Diagnosis not present

## 2015-02-18 DIAGNOSIS — E119 Type 2 diabetes mellitus without complications: Secondary | ICD-10-CM | POA: Diagnosis not present

## 2015-02-18 DIAGNOSIS — E782 Mixed hyperlipidemia: Secondary | ICD-10-CM | POA: Diagnosis not present

## 2015-02-18 DIAGNOSIS — I10 Essential (primary) hypertension: Secondary | ICD-10-CM | POA: Diagnosis not present

## 2015-02-23 ENCOUNTER — Inpatient Hospital Stay (HOSPITAL_COMMUNITY)
Admission: EM | Admit: 2015-02-23 | Discharge: 2015-02-26 | DRG: 190 | Disposition: A | Discharge status: Home / Self Care | Payer: Medicare Other | Attending: Internal Medicine | Admitting: Internal Medicine

## 2015-02-23 ENCOUNTER — Emergency Department (HOSPITAL_COMMUNITY): Payer: Medicare Other

## 2015-02-23 ENCOUNTER — Encounter (HOSPITAL_COMMUNITY): Payer: Self-pay

## 2015-02-23 DIAGNOSIS — E876 Hypokalemia: Secondary | ICD-10-CM | POA: Diagnosis not present

## 2015-02-23 DIAGNOSIS — Z599 Problem related to housing and economic circumstances, unspecified: Secondary | ICD-10-CM

## 2015-02-23 DIAGNOSIS — R0600 Dyspnea, unspecified: Secondary | ICD-10-CM | POA: Diagnosis not present

## 2015-02-23 DIAGNOSIS — M549 Dorsalgia, unspecified: Secondary | ICD-10-CM | POA: Diagnosis present

## 2015-02-23 DIAGNOSIS — F17210 Nicotine dependence, cigarettes, uncomplicated: Secondary | ICD-10-CM | POA: Diagnosis present

## 2015-02-23 DIAGNOSIS — E1165 Type 2 diabetes mellitus with hyperglycemia: Secondary | ICD-10-CM | POA: Diagnosis present

## 2015-02-23 DIAGNOSIS — E1159 Type 2 diabetes mellitus with other circulatory complications: Secondary | ICD-10-CM | POA: Insufficient documentation

## 2015-02-23 DIAGNOSIS — R0602 Shortness of breath: Secondary | ICD-10-CM | POA: Diagnosis not present

## 2015-02-23 DIAGNOSIS — J9601 Acute respiratory failure with hypoxia: Secondary | ICD-10-CM | POA: Diagnosis present

## 2015-02-23 DIAGNOSIS — Z7984 Long term (current) use of oral hypoglycemic drugs: Secondary | ICD-10-CM | POA: Diagnosis not present

## 2015-02-23 DIAGNOSIS — I10 Essential (primary) hypertension: Secondary | ICD-10-CM | POA: Diagnosis not present

## 2015-02-23 DIAGNOSIS — R079 Chest pain, unspecified: Secondary | ICD-10-CM

## 2015-02-23 DIAGNOSIS — Z9641 Presence of insulin pump (external) (internal): Secondary | ICD-10-CM | POA: Diagnosis present

## 2015-02-23 DIAGNOSIS — I160 Hypertensive urgency: Secondary | ICD-10-CM | POA: Diagnosis not present

## 2015-02-23 DIAGNOSIS — R7989 Other specified abnormal findings of blood chemistry: Secondary | ICD-10-CM

## 2015-02-23 DIAGNOSIS — J441 Chronic obstructive pulmonary disease with (acute) exacerbation: Secondary | ICD-10-CM | POA: Diagnosis not present

## 2015-02-23 DIAGNOSIS — E119 Type 2 diabetes mellitus without complications: Secondary | ICD-10-CM

## 2015-02-23 DIAGNOSIS — R011 Cardiac murmur, unspecified: Secondary | ICD-10-CM | POA: Diagnosis present

## 2015-02-23 DIAGNOSIS — Z79899 Other long term (current) drug therapy: Secondary | ICD-10-CM | POA: Diagnosis not present

## 2015-02-23 DIAGNOSIS — Z882 Allergy status to sulfonamides status: Secondary | ICD-10-CM | POA: Diagnosis not present

## 2015-02-23 DIAGNOSIS — T380X5A Adverse effect of glucocorticoids and synthetic analogues, initial encounter: Secondary | ICD-10-CM | POA: Diagnosis present

## 2015-02-23 DIAGNOSIS — K219 Gastro-esophageal reflux disease without esophagitis: Secondary | ICD-10-CM | POA: Diagnosis present

## 2015-02-23 DIAGNOSIS — E1151 Type 2 diabetes mellitus with diabetic peripheral angiopathy without gangrene: Secondary | ICD-10-CM | POA: Diagnosis not present

## 2015-02-23 DIAGNOSIS — G8929 Other chronic pain: Secondary | ICD-10-CM | POA: Diagnosis not present

## 2015-02-23 DIAGNOSIS — J45901 Unspecified asthma with (acute) exacerbation: Secondary | ICD-10-CM | POA: Diagnosis not present

## 2015-02-23 DIAGNOSIS — R069 Unspecified abnormalities of breathing: Secondary | ICD-10-CM | POA: Diagnosis not present

## 2015-02-23 DIAGNOSIS — I5033 Acute on chronic diastolic (congestive) heart failure: Secondary | ICD-10-CM | POA: Diagnosis not present

## 2015-02-23 DIAGNOSIS — I739 Peripheral vascular disease, unspecified: Secondary | ICD-10-CM | POA: Diagnosis present

## 2015-02-23 DIAGNOSIS — I248 Other forms of acute ischemic heart disease: Secondary | ICD-10-CM | POA: Diagnosis not present

## 2015-02-23 DIAGNOSIS — F17200 Nicotine dependence, unspecified, uncomplicated: Secondary | ICD-10-CM | POA: Diagnosis present

## 2015-02-23 DIAGNOSIS — R778 Other specified abnormalities of plasma proteins: Secondary | ICD-10-CM

## 2015-02-23 DIAGNOSIS — R0789 Other chest pain: Secondary | ICD-10-CM | POA: Diagnosis not present

## 2015-02-23 HISTORY — DX: Headache, unspecified: R51.9

## 2015-02-23 HISTORY — DX: Type 2 diabetes mellitus with diabetic neuropathy, unspecified: E11.40

## 2015-02-23 HISTORY — DX: Type 2 diabetes mellitus without complications: E11.9

## 2015-02-23 HISTORY — DX: Heart failure, unspecified: I50.9

## 2015-02-23 HISTORY — DX: Reserved for inherently not codable concepts without codable children: IMO0001

## 2015-02-23 HISTORY — DX: Respiratory failure, unspecified, unspecified whether with hypoxia or hypercapnia: J96.90

## 2015-02-23 HISTORY — DX: Cardiac murmur, unspecified: R01.1

## 2015-02-23 HISTORY — DX: Ganglion, unspecified site: M67.40

## 2015-02-23 HISTORY — DX: Presence of insulin pump (external) (internal): Z96.41

## 2015-02-23 HISTORY — DX: Headache: R51

## 2015-02-23 HISTORY — DX: Chronic obstructive pulmonary disease, unspecified: J44.9

## 2015-02-23 HISTORY — DX: Peripheral vascular disease, unspecified: I73.9

## 2015-02-23 HISTORY — DX: Essential (primary) hypertension: I10

## 2015-02-23 LAB — COMPREHENSIVE METABOLIC PANEL
ALT: 56 U/L — AB (ref 14–54)
AST: 100 U/L — AB (ref 15–41)
Albumin: 3.7 g/dL (ref 3.5–5.0)
Alkaline Phosphatase: 78 U/L (ref 38–126)
Anion gap: 13 (ref 5–15)
BUN: 6 mg/dL (ref 6–20)
CHLORIDE: 96 mmol/L — AB (ref 101–111)
CO2: 26 mmol/L (ref 22–32)
CREATININE: 0.8 mg/dL (ref 0.44–1.00)
Calcium: 9.6 mg/dL (ref 8.9–10.3)
GFR calc Af Amer: >60 mL/min (ref >60)
Glucose, Bld: 160 mg/dL — ABNORMAL HIGH (ref 65–99)
Potassium: 3.2 mmol/L — ABNORMAL LOW (ref 3.5–5.1)
Sodium: 135 mmol/L (ref 135–145)
Total Bilirubin: 0.6 mg/dL (ref 0.3–1.2)
Total Protein: 7.8 g/dL (ref 6.5–8.1)

## 2015-02-23 LAB — CBC WITH DIFFERENTIAL/PLATELET
Basophils Absolute: 0 10*3/uL (ref 0.0–0.1)
Basophils Relative: 1 %
EOS ABS: 0.2 10*3/uL (ref 0.0–0.7)
EOS PCT: 3 %
HCT: 46.5 % — ABNORMAL HIGH (ref 36.0–46.0)
Hemoglobin: 15 g/dL (ref 12.0–15.0)
LYMPHS ABS: 0.8 10*3/uL (ref 0.7–4.0)
Lymphocytes Relative: 10 %
MCH: 25.6 pg — AB (ref 26.0–34.0)
MCHC: 32.3 g/dL (ref 30.0–36.0)
MCV: 79.5 fL (ref 78.0–100.0)
MONOS PCT: 10 %
Monocytes Absolute: 0.8 10*3/uL (ref 0.1–1.0)
Neutro Abs: 6.5 10*3/uL (ref 1.7–7.7)
Neutrophils Relative %: 76 %
PLATELETS: 289 10*3/uL (ref 150–400)
RBC: 5.85 MIL/uL — AB (ref 3.87–5.11)
RDW: 14.5 % (ref 11.5–15.5)
WBC: 8.5 10*3/uL (ref 4.0–10.5)

## 2015-02-23 LAB — I-STAT CG4 LACTIC ACID, ED: Lactic Acid, Venous: 1 mmol/L (ref 0.5–2.0)

## 2015-02-23 LAB — BRAIN NATRIURETIC PEPTIDE: B Natriuretic Peptide: 258.5 pg/mL — ABNORMAL HIGH (ref 0.0–100.0)

## 2015-02-23 LAB — TROPONIN I: TROPONIN I: 0.21 ng/mL — AB (ref <0.031)

## 2015-02-23 MED ORDER — POTASSIUM CHLORIDE 20 MEQ/15ML (10%) PO SOLN
40.0000 meq | Freq: Once | ORAL | Status: AC
  Administered 2015-02-24: 40 meq via ORAL
  Filled 2015-02-23: qty 30

## 2015-02-23 MED ORDER — MAGNESIUM SULFATE 2 GM/50ML IV SOLN
2.0000 g | Freq: Once | INTRAVENOUS | Status: AC
  Administered 2015-02-24: 2 g via INTRAVENOUS
  Filled 2015-02-23: qty 50

## 2015-02-23 MED ORDER — IPRATROPIUM BROMIDE 0.02 % IN SOLN
0.5000 mg | Freq: Once | RESPIRATORY_TRACT | Status: AC
  Administered 2015-02-23: 0.5 mg via RESPIRATORY_TRACT

## 2015-02-23 MED ORDER — ALBUTEROL (5 MG/ML) CONTINUOUS INHALATION SOLN
15.0000 mg/h | INHALATION_SOLUTION | Freq: Once | RESPIRATORY_TRACT | Status: AC
  Administered 2015-02-23: 15 mg/h via RESPIRATORY_TRACT

## 2015-02-23 MED ORDER — METHYLPREDNISOLONE SODIUM SUCC 125 MG IJ SOLR
125.0000 mg | Freq: Once | INTRAMUSCULAR | Status: AC
  Administered 2015-02-23: 125 mg via INTRAVENOUS
  Filled 2015-02-23: qty 2

## 2015-02-23 MED ORDER — ASPIRIN 81 MG PO CHEW
324.0000 mg | CHEWABLE_TABLET | Freq: Once | ORAL | Status: AC
  Administered 2015-02-23: 324 mg via ORAL
  Filled 2015-02-23: qty 4

## 2015-02-23 NOTE — ED Provider Notes (Signed)
CSN: [id number]     Arrival date & time 02/23/15  2107 History   First MD Initiated Contact with Patient 02/23/15 2110     Chief Complaint  Patient presents with  . Shortness of Breath     (Consider location/radiation/quality/duration/timing/severity/associated sxs/prior Treatment) HPI  65 year old female presents with worsening shortest of breath over the last 3 days. Has been having cough and congestion. Patient states she has a history of asthma but denies COPD or CHF. Patient is a current smoker. Tried albuterol with no relief. Patient states she has a history of hypertension but has not taken her medicines today because she has felt bad. No chest pain. Cough has some yellow sputum. Has had some ankle and foot swelling starting today.  Past Medical History  Diagnosis Date  . Diabetes mellitus without complication (HCC)   . Hypertension   . Ganglion cyst   . Heart murmur    Past Surgical History  Procedure Laterality Date  . Femoral-femoral bypass graft      right and left legs  . Carpal tunnel release     No family history on file. Social History  Substance Use Topics  . Smoking status: Current Every Day Smoker -- 1.00 packs/day    Types: Cigarettes  . Smokeless tobacco: Never Used  . Alcohol Use: Yes     Comment: occasionally   OB History    No data available     Review of Systems  Constitutional: Negative for fever.  HENT: Positive for congestion.   Respiratory: Positive for cough and shortness of breath.   Cardiovascular: Positive for leg swelling. Negative for chest pain.  Gastrointestinal: Negative for vomiting.  All other systems reviewed and are negative.     Allergies  Sulfonamide derivatives  Home Medications   Prior to Admission medications   Not on File   BP 215/78 mmHg  Pulse 109  Temp(Src) 98.6 F (37 C) (Oral)  Resp 29  Ht 5\' 4"  (1.626 m)  Wt 229 lb (103.874 kg)  BMI 39.29 kg/m2  SpO2 96% Physical Exam  Constitutional: She is  oriented to person, place, and time. She appears well-developed and well-nourished.  HENT:  Head: Normocephalic and atraumatic.  Right Ear: External ear normal.  Left Ear: External ear normal.  Nose: Nose normal.  Eyes: Right eye exhibits no discharge. Left eye exhibits no discharge.  Cardiovascular: Regular rhythm and normal heart sounds.  Tachycardia present.   Pulmonary/Chest: Tachypnea noted. She has decreased breath sounds (diffuse decreased breath sounds).  Abdominal: Soft. There is no tenderness.  Musculoskeletal: She exhibits edema (trace non pitting edema to bilateral ankles).  Neurological: She is alert and oriented to person, place, and time.  Skin: Skin is warm and dry.  Nursing note and vitals reviewed.   ED Course  Procedures (including critical care time) Labs Review Labs Reviewed  COMPREHENSIVE METABOLIC PANEL - Abnormal; Notable for the following:    Potassium 3.2 (*)    Chloride 96 (*)    Glucose, Bld 160 (*)    AST 100 (*)    ALT 56 (*)    All other components within normal limits  CBC WITH DIFFERENTIAL/PLATELET - Abnormal; Notable for the following:    RBC 5.85 (*)    HCT 46.5 (*)    MCH 25.6 (*)    All other components within normal limits  TROPONIN I - Abnormal; Notable for the following:    Troponin I 0.21 (*)    All other components within normal  limits  BRAIN NATRIURETIC PEPTIDE - Abnormal; Notable for the following:    B Natriuretic Peptide 258.5 (*)    All other components within normal limits  MAGNESIUM  PHOSPHORUS  I-STAT CG4 LACTIC ACID, ED    Imaging Review Dg Chest Port 1 View  02/23/2015  CLINICAL DATA:  65 year old female with shortness of breath EXAM: PORTABLE CHEST 1 VIEW COMPARISON:  Chest CT dated 07/29/2004 FINDINGS: Single-view of the chest does not demonstrate a focal consolidation. There is no pleural effusion or pneumothorax. There is mild cardiomegaly. The osseous structures are grossly unremarkable. IMPRESSION: No active  disease. Cardiomegaly. Electronically Signed   By: Elgie CollardArash  Radparvar M.D.   On: 02/23/2015 22:22   I have personally reviewed and evaluated these images and lab results as part of my medical decision-making.   EKG Interpretation   Date/Time:  Monday February 23 2015 21:16:38 EST Ventricular Rate:  109 PR Interval:  171 QRS Duration: 88 QT Interval:  318 QTC Calculation: 428 R Axis:   60 Text Interpretation:  Sinus tachycardia LVH with secondary repolarization  abnormality Anterior infarct, old No significant change since 2007  Confirmed by Jeris Roser  MD, Carlisia Geno ([id number]) on 02/23/2015 9:47:21 PM      MDM   Final diagnoses:  Asthma exacerbation  Elevated troponin    Patient with acute risk for distress secondary to bronchospasm. She denies a prior history of COPD, this appears to be asthma related is having increased work of breathing with significant decreased breath sounds. She did open up after continuous albuterol.  Now has prominent wheezing but is breathing much easier. She has not and does not currently have chest pain. Her EKG shows ST/T-wave changes but these appear most consistent with LVH and has not changed since 2007. Does have a small troponin elevation that is likely secondary to respiratory distress and hypertension. Cards will be consulted but I doubt ACS. Not c/w PE. Patient to be admitted to hospitalist.   Pricilla LovelessScott Lenay Lovejoy, MD 02/23/15 2348

## 2015-02-23 NOTE — ED Notes (Signed)
Admitting at bedside 

## 2015-02-23 NOTE — ED Notes (Signed)
Message sent to pharmacy about Potassium

## 2015-02-23 NOTE — ED Notes (Signed)
MD at bedside. 

## 2015-02-23 NOTE — ED Notes (Signed)
Per [facility]: For the past 2 days pt has had shortness of breath, has had a cold for the last couple of weeks. Some expiratory wheezing noted, pt refused treatments in route. Pt has had anxiety, pt has had pressure in her stomach.

## 2015-02-24 ENCOUNTER — Encounter (HOSPITAL_COMMUNITY): Payer: Self-pay | Admitting: General Practice

## 2015-02-24 DIAGNOSIS — R7989 Other specified abnormal findings of blood chemistry: Secondary | ICD-10-CM

## 2015-02-24 DIAGNOSIS — E876 Hypokalemia: Secondary | ICD-10-CM | POA: Diagnosis present

## 2015-02-24 DIAGNOSIS — I10 Essential (primary) hypertension: Secondary | ICD-10-CM

## 2015-02-24 DIAGNOSIS — I160 Hypertensive urgency: Secondary | ICD-10-CM | POA: Diagnosis present

## 2015-02-24 DIAGNOSIS — R778 Other specified abnormalities of plasma proteins: Secondary | ICD-10-CM | POA: Diagnosis present

## 2015-02-24 LAB — CBC
HCT: 43.6 % (ref 36.0–46.0)
Hemoglobin: 14 g/dL (ref 12.0–15.0)
MCH: 25.8 pg — ABNORMAL LOW (ref 26.0–34.0)
MCHC: 32.1 g/dL (ref 30.0–36.0)
MCV: 80.3 fL (ref 78.0–100.0)
PLATELETS: 262 10*3/uL (ref 150–400)
RBC: 5.43 MIL/uL — ABNORMAL HIGH (ref 3.87–5.11)
RDW: 14.6 % (ref 11.5–15.5)
WBC: 9.7 10*3/uL (ref 4.0–10.5)

## 2015-02-24 LAB — CREATININE, SERUM: CREATININE: 0.94 mg/dL (ref 0.44–1.00)

## 2015-02-24 LAB — GLUCOSE, CAPILLARY
GLUCOSE-CAPILLARY: 237 mg/dL — AB (ref 65–99)
GLUCOSE-CAPILLARY: 212 mg/dL — AB (ref 65–99)
Glucose-Capillary: 310 mg/dL — ABNORMAL HIGH (ref 65–99)

## 2015-02-24 LAB — CBG MONITORING, ED: Glucose-Capillary: 200 mg/dL — ABNORMAL HIGH (ref 65–99)

## 2015-02-24 LAB — TROPONIN I
Troponin I: 0.25 ng/mL — ABNORMAL HIGH (ref <0.031)
Troponin I: 0.24 ng/mL — ABNORMAL HIGH (ref <0.031)

## 2015-02-24 LAB — MRSA PCR SCREENING: MRSA BY PCR: NEGATIVE

## 2015-02-24 LAB — PHOSPHORUS: PHOSPHORUS: 3.1 mg/dL (ref 2.5–4.6)

## 2015-02-24 LAB — MAGNESIUM: Magnesium: 2.7 mg/dL — ABNORMAL HIGH (ref 1.7–2.4)

## 2015-02-24 MED ORDER — LOSARTAN POTASSIUM 50 MG PO TABS
50.0000 mg | ORAL_TABLET | Freq: Every day | ORAL | Status: DC
  Administered 2015-02-24 – 2015-02-26 (×3): 50 mg via ORAL
  Filled 2015-02-24 (×4): qty 1

## 2015-02-24 MED ORDER — HYDRALAZINE HCL 20 MG/ML IJ SOLN
10.0000 mg | Freq: Four times a day (QID) | INTRAMUSCULAR | Status: DC | PRN
  Administered 2015-02-24 – 2015-02-25 (×3): 10 mg via INTRAVENOUS
  Filled 2015-02-24 (×3): qty 1

## 2015-02-24 MED ORDER — LEVOFLOXACIN 750 MG PO TABS
750.0000 mg | ORAL_TABLET | Freq: Every day | ORAL | Status: DC
  Administered 2015-02-24 – 2015-02-26 (×3): 750 mg via ORAL
  Filled 2015-02-24 (×3): qty 1

## 2015-02-24 MED ORDER — NICOTINE 14 MG/24HR TD PT24
14.0000 mg | MEDICATED_PATCH | Freq: Every day | TRANSDERMAL | Status: DC | PRN
  Filled 2015-02-24: qty 1

## 2015-02-24 MED ORDER — PANTOPRAZOLE SODIUM 40 MG PO TBEC
40.0000 mg | DELAYED_RELEASE_TABLET | Freq: Every day | ORAL | Status: DC
  Administered 2015-02-25 – 2015-02-26 (×2): 40 mg via ORAL
  Filled 2015-02-24 (×2): qty 1

## 2015-02-24 MED ORDER — INSULIN PUMP
Freq: Three times a day (TID) | SUBCUTANEOUS | Status: DC
  Administered 2015-02-24: 22:00:00 via SUBCUTANEOUS
  Administered 2015-02-24: 1 via SUBCUTANEOUS
  Administered 2015-02-25 (×2): via SUBCUTANEOUS
  Administered 2015-02-26: 1 via SUBCUTANEOUS
  Filled 2015-02-24: qty 1

## 2015-02-24 MED ORDER — INSULIN ASPART 100 UNIT/ML ~~LOC~~ SOLN: 0.0000 [IU] | Freq: Three times a day (TID) | SUBCUTANEOUS | Status: DC

## 2015-02-24 MED ORDER — METHYLPREDNISOLONE SODIUM SUCC 125 MG IJ SOLR
60.0000 mg | Freq: Four times a day (QID) | INTRAMUSCULAR | Status: DC
  Administered 2015-02-24 – 2015-02-25 (×6): 60 mg via INTRAVENOUS
  Filled 2015-02-24 (×6): qty 2

## 2015-02-24 MED ORDER — ALBUTEROL SULFATE (2.5 MG/3ML) 0.083% IN NEBU
2.5000 mg | INHALATION_SOLUTION | RESPIRATORY_TRACT | Status: DC | PRN
  Administered 2015-02-24: 2.5 mg via RESPIRATORY_TRACT
  Filled 2015-02-24: qty 3

## 2015-02-24 MED ORDER — AMLODIPINE BESYLATE 10 MG PO TABS
10.0000 mg | ORAL_TABLET | Freq: Every day | ORAL | Status: DC
  Administered 2015-02-24 – 2015-02-26 (×3): 10 mg via ORAL
  Filled 2015-02-24 (×3): qty 1

## 2015-02-24 MED ORDER — CARVEDILOL 3.125 MG PO TABS
3.1250 mg | ORAL_TABLET | Freq: Two times a day (BID) | ORAL | Status: DC
  Administered 2015-02-24 – 2015-02-25 (×2): 3.125 mg via ORAL
  Filled 2015-02-24 (×5): qty 1

## 2015-02-24 MED ORDER — IPRATROPIUM-ALBUTEROL 0.5-2.5 (3) MG/3ML IN SOLN
3.0000 mL | Freq: Four times a day (QID) | RESPIRATORY_TRACT | Status: DC
  Administered 2015-02-24 – 2015-02-25 (×6): 3 mL via RESPIRATORY_TRACT
  Filled 2015-02-24 (×6): qty 3

## 2015-02-24 MED ORDER — GLIPIZIDE ER 10 MG PO TB24
10.0000 mg | ORAL_TABLET | Freq: Every day | ORAL | Status: DC
  Administered 2015-02-25 – 2015-02-26 (×2): 10 mg via ORAL
  Filled 2015-02-24 (×4): qty 1

## 2015-02-24 MED ORDER — FUROSEMIDE 10 MG/ML IJ SOLN
40.0000 mg | Freq: Once | INTRAMUSCULAR | Status: AC
  Administered 2015-02-24: 40 mg via INTRAVENOUS
  Filled 2015-02-24: qty 4

## 2015-02-24 MED ORDER — ENOXAPARIN SODIUM 40 MG/0.4ML ~~LOC~~ SOLN
40.0000 mg | SUBCUTANEOUS | Status: DC
  Administered 2015-02-24 – 2015-02-25 (×2): 40 mg via SUBCUTANEOUS
  Filled 2015-02-24 (×3): qty 0.4

## 2015-02-24 MED ORDER — ASPIRIN 81 MG PO CHEW
324.0000 mg | CHEWABLE_TABLET | Freq: Every day | ORAL | Status: DC
Start: 2015-02-24 — End: 2015-02-26
  Administered 2015-02-24 – 2015-02-26 (×3): 324 mg via ORAL
  Filled 2015-02-24 (×3): qty 4

## 2015-02-24 NOTE — H&P (Signed)
[facility] History and Physical  Katherine Williams RUE:[id number] DOB: 1950-11-19 DOA: 02/23/2015  Referring physician: Pricilla LovelessScott Goldston, M.D. PCP: Dwana MelenaZack Hall, MD   Chief Complaint: Shortness of breath.  HPI: Katherine GinsbergKathy M Ask is a 65 y.o. female  with a past medical history of type 2 diabetes, asthma, tobacco use disorder, hypertension, ganglion cyst, heart murmur who comes to the ED via EMS due to worsening of shortness of breath.  Per patient, since Friday evening, wheezing, productive cough of whitish sputum (occasional yellowish sputum since today), fatigue and malaise which proceeded to cold like symptoms since late December. She smokes about a pack of cigarettes a day, but has not smoked since Friday due to worsening of her symptoms. She states that she try her bronchodilators inhalers at home, without any relief. She denies fever, but complains of chills, fatigue and malaise. She denies chest pain, palpitations, dizziness, diaphoresis, orthopnea, but complains of mild lower extremity edema since earlier today.  In the ER, the patient was noticed to have a systolic blood pressure over 200 mmHg, but states that she had not taken her antihypertensive medication earlier in the day. Her dyspnea improved significantly, after she received bronchodilators, supplemental oxygen and methylprednisolone IVP. When seen, patient was mildly anxious, but states that she felt and was breathing a lot better after treatment.   Review of Systems:  Constitutional:  Positive chills, fatigue. No weight loss, night sweats, Fevers,   HEENT:  Mild earaches bilaterally, no headache,  no blurred vision, no sore throat, no rhinorrhea. Cardio-vascular:  As above mentioned. GI:  Frequent heartburn, indigestion. abdominal pain, nausea, vomiting, diarrhea, change in bowel habits, loss of appetite  Resp:  As above mentioned.  Skin:  no rash or lesions.  GU:  no dysuria, change in color of urine, no urgency or  frequency. No flank pain.  Musculoskeletal:  No joint pain or swelling. No decreased range of motion. No back pain.  Psych:  No change in mood or affect. No depression or anxiety. No memory loss.   Past Medical History  Diagnosis Date  . Diabetes mellitus without complication (HCC)   . Hypertension   . Ganglion cyst   . Heart murmur    Past Surgical History  Procedure Laterality Date  . Femoral-femoral bypass graft      right and left legs  . Carpal tunnel release     Social History:  reports that she has been smoking Cigarettes.  She has been smoking about 1.00 pack per day. She has never used smokeless tobacco. She reports that she drinks alcohol. She reports that she uses illicit drugs (Marijuana).  Allergies  Allergen Reactions  . Lisinopril-Hydrochlorothiazide Hives and Itching  . Sulfonamide Derivatives Other (See Comments)    Hives    History reviewed. No pertinent family history.    Prior to Admission medications   Medication Sig Start Date End Date Taking? Authorizing Provider  albuterol (PROVENTIL HFA;VENTOLIN HFA) 108 (90 Base) MCG/ACT inhaler Inhale 2 puffs into the lungs every 6 (six) hours as needed for wheezing or shortness of breath.   Yes Historical Provider, MD  carvedilol (COREG) 3.125 MG tablet Take 3.125 mg by mouth 2 (two) times daily with a meal.   Yes Historical Provider, MD  CINNAMON PO Take 1 tablet by mouth daily.   Yes Historical Provider, MD  GARLIC PO Take 1 tablet by mouth daily.   Yes Historical Provider, MD  glipiZIDE (GLUCOTROL XL) 10 MG 24 hr tablet Take 10 mg  by mouth daily with breakfast.   Yes Historical Provider, MD  losartan (COZAAR) 50 MG tablet Take 50 mg by mouth daily.   Yes Historical Provider, MD  Multiple Vitamin (MULTIVITAMIN WITH MINERALS) TABS tablet Take 1 tablet by mouth daily.   Yes Historical Provider, MD  Red Yeast Rice Extract (RED YEAST RICE PO) Take 1 tablet by mouth daily.   Yes Historical Provider, MD   Physical  Exam: Filed Vitals:   02/23/15 2300 02/23/15 2315 02/23/15 2330 02/23/15 2345  BP: 177/78 197/82 209/80 160/140  Pulse: 110 117 112 106  Temp:      TempSrc:      Resp: 26 23 24 30   Height:      Weight:      SpO2: 95% 92% 92% 93%    Wt Readings from Last 3 Encounters:  02/23/15 103.874 kg (229 lb)  10/26/06 98.748 kg (217 lb 11.2 oz)  10/09/06 101.696 kg (224 lb 3.2 oz)    General:  Appears mildly anxious. Eyes: PERRL, normal lids, irises & conjunctiva ENT: grossly normal hearing, lips & tongue Neck: no LAD, masses or thyromegaly Cardiovascular: Tachycardic at 108 bpm, no m/r/g. Trace LE edema. Telemetry: Sinus tachycardia at 106 bpm.  Respiratory: Decreased breath sounds bilaterally, bilateral wheezing, no accessory muscle use was noticed during physical exam. Abdomen: soft, ntnd Skin: no rash or induration seen on limited exam Musculoskeletal: grossly normal tone BUE/BLE Psychiatric: grossly normal mood and affect, speech mildly pressured, but appropriate. Neurologic: Awake, alert, oriented 3, grossly non-focal.          Labs on Admission:  Basic Metabolic Panel:  Recent Labs Lab 02/23/15 2135  NA 135  K 3.2*  CL 96*  CO2 26  GLUCOSE 160*  BUN 6  CREATININE 0.80  CALCIUM 9.6   Liver Function Tests:  Recent Labs Lab 02/23/15 2135  AST 100*  ALT 56*  ALKPHOS 78  BILITOT 0.6  PROT 7.8  ALBUMIN 3.7   CBC:  Recent Labs Lab 02/23/15 2135  WBC 8.5  NEUTROABS 6.5  HGB 15.0  HCT 46.5*  MCV 79.5  PLT 289   Cardiac Enzymes:  Recent Labs Lab 02/23/15 2135  TROPONINI 0.21*    BNP (last 3 results)  Recent Labs  02/23/15 2135  BNP 258.5*    Radiological Exams on Admission: Dg Chest Port 1 View  02/23/2015  CLINICAL DATA:  65 year old female with shortness of breath EXAM: PORTABLE CHEST 1 VIEW COMPARISON:  Chest CT dated 07/29/2004 FINDINGS: Single-view of the chest does not demonstrate a focal consolidation. There is no pleural effusion or  pneumothorax. There is mild cardiomegaly. The osseous structures are grossly unremarkable. IMPRESSION: No active disease. Cardiomegaly. Electronically Signed   By: Elgie Collard M.D.   On: 02/23/2015 22:22    EKG: Independently reviewed. Vent. rate 109 BPM PR interval 171 ms QRS duration 88 ms QT/QTc 318/428 ms P-R-T axes 80 60 146 Sinus tachycardia LVH with secondary repolarization abnormality Anterior infarct, old No significant change since previous.  Assessment/Plan Principal Problem:   COPD exacerbation (HCC)   Asthma exacerbation Admit to a stepdown. Continue supplemental oxygen. Continue bronchodilators. Continue Solu-Medrol with CBG monitoring with regular insulin sliding scale. Check magnesium and phosphorus levels. Magnesium sulfate 2 g IVPB. Levaquin 750 mg by mouth daily.  Active Problems:   Elevated troponin level. The patient does not report chest pain. EKG does not show significant changes, when compared to previous. Likely due to respiratory distress, hypertensive urgency and tachycardia. Consult  cardiology if levels are stranding up Check echocardiogram.    Hypertensive urgency   Essential hypertension The patient had not taken her medications today. She subsequently took them in the emergency department. Continue blood pressure monitoring.    Diabetes mellitus, type 2 (HCC) I will hydrate her modified diet. Continue glipizide. Start CBG monitoring with regular insulin sliding scale while the patient is on glucocorticoids.    TOBACCO ABUSE Per patient, she has not smoked since Friday. Agreed to use a nicotine patch as needed.      GERD Start pantoprazole 40 mg by mouth daily.      Code Status: Full code. DVT Prophylaxis: Lovenox SQ. Family Communication:  Disposition Plan: Admit to step down for COPD/Asthma exacerbation and hypertensive urgency.    Time spent: Over 70 minutes were used t=during the process of this admission.   Bobette Mo [facility] Pager [phone].

## 2015-02-24 NOTE — ED Notes (Signed)
Admitting at bedside. Updated on BP and pt medication adherence

## 2015-02-24 NOTE — ED Notes (Signed)
Message sent to pharmacy requesting lovenox be verified and sent to [facility], this is the second request.

## 2015-02-24 NOTE — ED Notes (Signed)
Message sent to pharmacy about lovenox. 

## 2015-02-24 NOTE — Progress Notes (Deleted)
RFConsult: positive troponin in setting of uncontrolled hypertension and asthma exacerbation.  HPI:  65-year-old female with hx of HTN, DM, PAD s/p fem fem bypass, active smoking, presents with worsening shortest of breath over the last 3 days, found to be in hypertensive emergency and asthma exacerbation.  Believes her asthma trigger is the recent cold. Has been having cough and congestion. Denies COPD or CHF. She has been using albuterol at home without relief.   Per ER "Patient states she has a history of hypertension but has not taken her medicines today because she has felt bad. No chest pain. Cough has some yellow sputum. Has had some ankle and foot swelling starting today."   Patient denies any chest pain today.  She states she can walk up and down a flight of stairs without chest pressure or chest pain.  Her ECG shows sinus rhythm with LVH with repolarization abnormality.  12 point ROS otherwise notable for chronic back pain  PMHx Past Medical History  Diagnosis Date  . Diabetes mellitus without complication (HCC)   . Hypertension   . Ganglion cyst   . Heart murmur    Meds Scheduled Meds:  Continuous Infusions: . magnesium sulfate 1 - 4 g bolus IVPB 2 g (02/24/15 0004)   PRN Meds:. FHx: History reviewed. No pertinent family history.  Soc Hx Social History   Social History  . Marital Status: Widowed    Spouse Name: N/A  . Number of Children: N/A  . Years of Education: N/A   Occupational History  . Not on file.   Social History Main Topics  . Smoking status: Current Every Day Smoker -- 1.00 packs/day    Types: Cigarettes  . Smokeless tobacco: Never Used  . Alcohol Use: Yes     Comment: occasionally  . Drug Use: Yes    Special: Marijuana  . Sexual Activity: Not on file   Other Topics Concern  . Not on file   Social History Narrative  . No narrative on file   Allergy:  Allergies  Allergen Reactions  . Lisinopril-Hydrochlorothiazide Hives and  Itching  . 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  She is chest pain free now and previously.  She does not need acute treatment for acute coronary syndrome.  She can follow up with cardiology as an outpatient for further risk assessment.  Would consider titratable nitro gtt for treatment of her BP to achieve a pressure less than 160/100.  Estie Sproule, MD [employer] 

## 2015-02-24 NOTE — Progress Notes (Signed)
[facility] PROGRESS NOTE  Katherine GinsbergKathy M Williams ZOX:[id number] DOB: Aug 27, 1950 DOA: 02/23/2015 PCP: Katherine MelenaZack Hall, MD  Assessment/Plan: 1. Acute Hypoxic resp failure -improving -COPD exacerbation and CHF/Hypertensive urgency -Solumedrol, nebs, levaquin -CXR clear -IV lasix x1 now  2. Elevated troponin -likely demand ischemia due to #1, has multiple cardiac risk factors too -trend troponin, check ECHO -ASA/Coreg, Cardiology consulted -denies chest pain  3. Accelerated HTN -improving, add amlodipine, continue coreg/cozaar -hydralazine PRN  4. DM -Resume Insulin pump -DM coordinator consult  5. Tobacco abuse -counseled, nicotine patch  6. GERD -PPI  DVT proph: lovenox  Code Status: Full Code Family Communication: none at bedside Disposition Plan: Home when improved   HPI/Subjective: Breathing better  Objective: Filed Vitals:   02/24/15 1216 02/24/15 1305  BP: 199/70 190/79  Pulse: 67   Temp: 98.6 F (37 C)   Resp: 20    No intake or output data in the 24 hours ending 02/24/15 1320 Filed Weights   02/23/15 2117 02/24/15 1216  Weight: 103.874 kg (229 lb) 103.3 kg (227 lb 11.8 oz)    Exam:   General: AAOx3  Cardiovascular: S1S2/RRR  Respiratory: scattered ronchi  Abdomen: soft, NT, BS present  Musculoskeletal: no edema c/c   Data Reviewed: Basic Metabolic Panel:  Recent Labs Lab 02/23/15 2135 02/24/15 0313  NA 135  --   K 3.2*  --   CL 96*  --   CO2 26  --   GLUCOSE 160*  --   BUN 6  --   CREATININE 0.80 0.94  CALCIUM 9.6  --   MG  --  2.7*  PHOS  --  3.1   Liver Function Tests:  Recent Labs Lab 02/23/15 2135  AST 100*  ALT 56*  ALKPHOS 78  BILITOT 0.6  PROT 7.8  ALBUMIN 3.7   No results for input(s): LIPASE, AMYLASE in the last 168 hours. No results for input(s): AMMONIA in the last 168 hours. CBC:  Recent Labs Lab 02/23/15 2135 02/24/15 0313  WBC 8.5 9.7  NEUTROABS 6.5  --   HGB 15.0 14.0  HCT 46.5* 43.6  MCV 79.5  80.3  PLT 289 262   Cardiac Enzymes:  Recent Labs Lab 02/23/15 2135 02/24/15 0313 02/24/15 0959  TROPONINI 0.21* 0.25* 0.24*   BNP (last 3 results)  Recent Labs  02/23/15 2135  BNP 258.5*    ProBNP (last 3 results) No results for input(s): PROBNP in the last 8760 hours.  CBG:  Recent Labs Lab 02/24/15 0822 02/24/15 1304  GLUCAP 200* 237*    No results found for this or any previous visit (from the past 240 hour(s)).   Studies: Dg Chest Port 1 View  02/23/2015  CLINICAL DATA:  65 year old female with shortness of breath EXAM: PORTABLE CHEST 1 VIEW COMPARISON:  Chest CT dated 07/29/2004 FINDINGS: Single-view of the chest does not demonstrate a focal consolidation. There is no pleural effusion or pneumothorax. There is mild cardiomegaly. The osseous structures are grossly unremarkable. IMPRESSION: No active disease. Cardiomegaly. Electronically Signed   By: Elgie CollardArash  Radparvar M.D.   On: 02/23/2015 22:22    Scheduled Meds: . carvedilol  3.125 mg Oral BID WC  . enoxaparin (LOVENOX) injection  40 mg Subcutaneous Q24H  . glipiZIDE  10 mg Oral Q breakfast  . insulin pump   Subcutaneous TID AC, HS, 0200  . ipratropium-albuterol  3 mL Nebulization Q6H  . levofloxacin  750 mg Oral Daily  . losartan  50 mg Oral Daily  . methylPREDNISolone (  SOLU-MEDROL) injection  60 mg Intravenous Q6H  . pantoprazole  40 mg Oral Daily   Continuous Infusions:  Antibiotics Given (last 72 hours)    Date/Time Action Medication Dose   02/24/15 0805 Given   levofloxacin (LEVAQUIN) tablet 750 mg 750 mg      Principal Problem:   COPD exacerbation (HCC) Active Problems:   Diabetes mellitus, type 2 (HCC)   TOBACCO ABUSE   Essential hypertension   GERD   Asthma exacerbation   Hypertensive urgency   Hypokalemia   Elevated troponin    Time spent:    [facility]  [facility] Pager [phone]. If 7PM-7AM, please contact night-coverage at [email or web address], password  [facility] 02/24/2015, 1:20 PM  LOS: 1 day

## 2015-02-24 NOTE — Consult Note (Signed)
RFConsult: positive troponin in setting of uncontrolled hypertension and asthma exacerbation.  HPI:  65 year old female with hx of HTN, DM, PAD s/p fem fem bypass, active smoking, presents with worsening shortest of breath over the last 3 days, found to be in hypertensive emergency and asthma exacerbation.  Believes her asthma trigger is the recent cold. Has been having cough and congestion. Denies COPD or CHF. She has been using albuterol at home without relief.   Per ER "Patient states she has a history of hypertension but has not taken her medicines today because she has felt bad. No chest pain. Cough has some yellow sputum. Has had some ankle and foot swelling starting today."   Patient denies any chest pain today.  She states she can walk up and down a flight of stairs without chest pressure or chest pain.  Her ECG shows sinus rhythm with LVH with repolarization abnormality.  12 point ROS otherwise notable for chronic back pain  PMHx Past Medical History  Diagnosis Date  . Diabetes mellitus without complication (HCC)   . Hypertension   . Ganglion cyst   . Heart murmur    Meds Scheduled Meds:  Continuous Infusions: . magnesium sulfate 1 - 4 g bolus IVPB 2 g (02/24/15 0004)   PRN Meds:. FHx: History reviewed. No pertinent family history.  Soc Hx Social History   Social History  . Marital Status: Widowed    Spouse Name: N/A  . Number of Children: N/A  . Years of Education: N/A   Occupational History  . Not on file.   Social History Main Topics  . Smoking status: Current Every Day Smoker -- 1.00 packs/day    Types: Cigarettes  . Smokeless tobacco: Never Used  . Alcohol Use: Yes     Comment: occasionally  . Drug Use: Yes    Special: Marijuana  . Sexual Activity: Not on file   Other Topics Concern  . Not on file   Social History Narrative  . No narrative on file   Allergy:  Allergies  Allergen Reactions  . Lisinopril-Hydrochlorothiazide Hives and  Itching  . Sulfonamide Derivatives Other (See Comments)    Hives   Exam BP 160/140 mmHg  Pulse 106  Temp(Src) 98.6 F (37 C) (Oral)  Resp 30  Ht 5\' 4"  (1.626 m)  Wt 103.874 kg (229 lb)  BMI 39.29 kg/m2  SpO2 93% 64yoF on oxygen in NAD JVP normal +expiratory wheeze Tachycardic Soft nt AAO without focal deficit  ECG as above Echo: none on file  Impression 64yoF with HTN, smoking, PAD admitted with hypertensive emergency and asthma exacerbation found to have type II - demand mediated MI.  She is chest pain free now and previously.  She does not need acute treatment for acute coronary syndrome.  She can follow up with cardiology as an outpatient for further risk assessment.  Would consider titratable nitro gtt for treatment of her BP to achieve a pressure less than 160/100.  Dossie ArbourEdward Vianka Ertel, MD [employer]

## 2015-02-24 NOTE — Care Management (Signed)
Utilization review completed. Samar Venneman, RN Case Manager [phone]. 

## 2015-02-24 NOTE — ED Notes (Signed)
POCT CBG resulted 200; Amy, RN notified

## 2015-02-24 NOTE — Progress Notes (Addendum)
Inpatient Diabetes Program Recommendations  AACE/ADA: New Consensus Statement on Inpatient Glycemic Control (2015)  Target Ranges:  Prepandial:   less than 140 mg/dL      Peak postprandial:   less than 180 mg/dL (1-2 hours)      Critically ill patients:  140 - 180 mg/dL   Review of Glycemic Control  Results for Katherine GinsbergBROWN, [name] (MRN [id number]) as of 02/24/2015 14:44  Ref. Range 02/24/2015 08:22 02/24/2015 13:04  Glucose-Capillary Latest Ref Range: 65-99 mg/dL 829200 (H) 562237 (H)   Spoke with patient regarding diabetes and home regimen for diabetes management.  Patient states that she has been on an insulin pump for 15 years and is followed by Dr. Dwana MelenaZack Hall for diabetes management. Patient uses a [employer]tronic insulin pump.  Patient is alert and oriented and would prefer to use her pump while inpatient. Spoke with patient about the insulin pump contract and at anytime she is unable to operate her pump we would take her off. Patient agrees.  Current insulin pump settings are as follows:  Total daily basal insulin: 66.4 units/24 hours  Patient covers meals with 5 units of insulin in addition to correction scale  NURSING: Once insulin pump order set is ordered please print off the Patient insulin pump contract and flow sheet. The insulin pump contract should be signed by the patient and then placed in the chart. The patient insulin pump flow sheet will be completed by the patient at the bedside and the RN caring for the patient will use the patient's flow sheet to [facility]. RN will need to complete the Nursing Insulin Pump Flowsheet at least once a shift. Patient will need to keep extra insulin pump supplies at the bedside at all times.    Patient placed on steroids this admission. Patient unable to tell me the name of the medtronic rep that helps her with her pump. Patient reports knowing how to adjust insulin settings. While inpatient, patient may need to increase her total basal rate during the  course of her steroids.   Thanks,  Christena DeemShannon Efrata Brunner RN, MSN, [facility] Inpatient Diabetes Coordinator Team Pager [phone] (8a-5p)

## 2015-02-25 ENCOUNTER — Inpatient Hospital Stay (HOSPITAL_COMMUNITY): Payer: Medicare Other

## 2015-02-25 DIAGNOSIS — J441 Chronic obstructive pulmonary disease with (acute) exacerbation: Principal | ICD-10-CM

## 2015-02-25 DIAGNOSIS — I739 Peripheral vascular disease, unspecified: Secondary | ICD-10-CM

## 2015-02-25 DIAGNOSIS — R0600 Dyspnea, unspecified: Secondary | ICD-10-CM

## 2015-02-25 DIAGNOSIS — F17200 Nicotine dependence, unspecified, uncomplicated: Secondary | ICD-10-CM

## 2015-02-25 DIAGNOSIS — I10 Essential (primary) hypertension: Secondary | ICD-10-CM

## 2015-02-25 DIAGNOSIS — I160 Hypertensive urgency: Secondary | ICD-10-CM

## 2015-02-25 DIAGNOSIS — R7989 Other specified abnormal findings of blood chemistry: Secondary | ICD-10-CM

## 2015-02-25 DIAGNOSIS — E1151 Type 2 diabetes mellitus with diabetic peripheral angiopathy without gangrene: Secondary | ICD-10-CM

## 2015-02-25 DIAGNOSIS — E119 Type 2 diabetes mellitus without complications: Secondary | ICD-10-CM

## 2015-02-25 DIAGNOSIS — Z794 Long term (current) use of insulin: Secondary | ICD-10-CM

## 2015-02-25 DIAGNOSIS — E1159 Type 2 diabetes mellitus with other circulatory complications: Secondary | ICD-10-CM | POA: Insufficient documentation

## 2015-02-25 DIAGNOSIS — R0789 Other chest pain: Secondary | ICD-10-CM

## 2015-02-25 DIAGNOSIS — R079 Chest pain, unspecified: Secondary | ICD-10-CM | POA: Insufficient documentation

## 2015-02-25 LAB — BASIC METABOLIC PANEL
Anion gap: 10 (ref 5–15)
BUN: 18 mg/dL (ref 6–20)
CALCIUM: 9.4 mg/dL (ref 8.9–10.3)
CO2: 28 mmol/L (ref 22–32)
CREATININE: 0.96 mg/dL (ref 0.44–1.00)
Chloride: 96 mmol/L — ABNORMAL LOW (ref 101–111)
GFR calc non Af Amer: >60 mL/min (ref >60)
Glucose, Bld: 304 mg/dL — ABNORMAL HIGH (ref 65–99)
Potassium: 3.8 mmol/L (ref 3.5–5.1)
SODIUM: 134 mmol/L — AB (ref 135–145)

## 2015-02-25 LAB — INFLUENZA PANEL BY PCR (TYPE A & B)
H1N1FLUPCR: NOT DETECTED
INFLAPCR: NEGATIVE
Influenza B By PCR: NEGATIVE

## 2015-02-25 LAB — CBC
HCT: 43.5 % (ref 36.0–46.0)
Hemoglobin: 14.1 g/dL (ref 12.0–15.0)
MCH: 25.5 pg — ABNORMAL LOW (ref 26.0–34.0)
MCHC: 32.4 g/dL (ref 30.0–36.0)
MCV: 78.7 fL (ref 78.0–100.0)
PLATELETS: 304 10*3/uL (ref 150–400)
RBC: 5.53 MIL/uL — AB (ref 3.87–5.11)
RDW: 14.6 % (ref 11.5–15.5)
WBC: 13.2 10*3/uL — AB (ref 4.0–10.5)

## 2015-02-25 LAB — GLUCOSE, CAPILLARY
GLUCOSE-CAPILLARY: 376 mg/dL — AB (ref 65–99)
GLUCOSE-CAPILLARY: 192 mg/dL — AB (ref 65–99)
Glucose-Capillary: 276 mg/dL — ABNORMAL HIGH (ref 65–99)
Glucose-Capillary: 246 mg/dL — ABNORMAL HIGH (ref 65–99)

## 2015-02-25 MED ORDER — CARVEDILOL 6.25 MG PO TABS
6.2500 mg | ORAL_TABLET | Freq: Two times a day (BID) | ORAL | Status: DC
  Administered 2015-02-25: 6.25 mg via ORAL
  Filled 2015-02-25: qty 1

## 2015-02-25 MED ORDER — CARVEDILOL 12.5 MG PO TABS
12.5000 mg | ORAL_TABLET | Freq: Two times a day (BID) | ORAL | Status: DC
  Administered 2015-02-26 (×2): 12.5 mg via ORAL
  Filled 2015-02-25 (×2): qty 1

## 2015-02-25 MED ORDER — IPRATROPIUM-ALBUTEROL 0.5-2.5 (3) MG/3ML IN SOLN
3.0000 mL | Freq: Four times a day (QID) | RESPIRATORY_TRACT | Status: DC | PRN
  Administered 2015-02-26: 3 mL via RESPIRATORY_TRACT
  Filled 2015-02-25: qty 3

## 2015-02-25 MED ORDER — PREDNISONE 20 MG PO TABS
40.0000 mg | ORAL_TABLET | Freq: Every day | ORAL | Status: DC
  Administered 2015-02-26: 40 mg via ORAL
  Filled 2015-02-25: qty 2

## 2015-02-25 MED ORDER — HYDRALAZINE HCL 25 MG PO TABS
25.0000 mg | ORAL_TABLET | Freq: Three times a day (TID) | ORAL | Status: DC
  Administered 2015-02-25 – 2015-02-26 (×2): 25 mg via ORAL
  Filled 2015-02-25 (×3): qty 1

## 2015-02-25 NOTE — Progress Notes (Signed)
Subjective: Can' really say how she feels.    Objective: Vital signs in last 24 hours: Temp:  [97.6 F (36.4 C)-98.6 F (37 C)] 98 F (36.7 C) (01/11 0744) Pulse Rate:  [57-97] 97 (01/11 0744) Resp:  [16-23] 20 (01/11 0744) BP: (168-209)/(56-98) 189/98 mmHg (01/11 0744) SpO2:  [96 %-100 %] 96 % (01/11 0752) Weight:  [227 lb 4.8 oz (103.103 kg)-227 lb 11.8 oz (103.3 kg)] 227 lb 4.8 oz (103.103 kg) (01/11 0410) Last BM Date: 02/23/15  Intake/Output from previous day:   Intake/Output this shift:    Medications Scheduled Meds: . amLODipine  10 mg Oral Daily  . aspirin  324 mg Oral Daily  . carvedilol  3.125 mg Oral BID WC  . enoxaparin (LOVENOX) injection  40 mg Subcutaneous Q24H  . glipiZIDE  10 mg Oral Q breakfast  . insulin pump   Subcutaneous TID AC, HS, 0200  . ipratropium-albuterol  3 mL Nebulization Q6H  . levofloxacin  750 mg Oral Daily  . losartan  50 mg Oral Daily  . methylPREDNISolone (SOLU-MEDROL) injection  60 mg Intravenous Q6H  . pantoprazole  40 mg Oral Daily   Continuous Infusions:  PRN Meds:.albuterol, hydrALAZINE, nicotine  PE: General appearance: alert, cooperative, no distress and Sitting on the edge of the bed talking on the phone Neck: no JVD Lungs: clear to auscultation bilaterally Heart: regular rate and rhythm, S1, S2 normal, no murmur, click, rub or gallop Abdomen: +BS, nontender Extremities: No LEE Pulses: 2+ radials, 0 PTs and DPs.  1+ popliteals Skin: Warm and dry.  Feet are warm Neurologic: Grossly normal  Lab Results:   Recent Labs  02/23/15 2135 02/24/15 0313 02/25/15 0406  WBC 8.5 9.7 13.2*  HGB 15.0 14.0 14.1  HCT 46.5* 43.6 43.5  PLT 289 262 304   BMET  Recent Labs  02/23/15 2135 02/24/15 0313 02/25/15 0406  NA 135  --  134*  K 3.2*  --  3.8  CL 96*  --  96*  CO2 26  --  28  GLUCOSE 160*  --  304*  BUN 6  --  18  CREATININE 0.80 0.94 0.96  CALCIUM 9.6  --  9.4   Cardiac Panel (last 3 results)  Recent  Labs  02/23/15 2135 02/24/15 0313 02/24/15 0959  TROPONINI 0.21* 0.25* 0.24*     Assessment/Plan 65 year old female with hx of HTN, DM, PAD s/p fem fem bypass, actively smoking, presented with worsening shortest of breath over the last 3 days, found to be in hypertensive emergency and asthma exacerbation.    COPD exacerbation (HCC)   Diabetes mellitus, type 2 (HCC)   TOBACCO ABUSE   Essential hypertension: home meds were-lisinopril/HCTZ only  Now on: amlodipine 10, coreg 3.125 BID, losartan 50, PRN IV hydralazine. Continue to titrate.    GERD   Asthma exacerbation   Hypertensive urgency   Hypokalemia: resolved.    Elevated troponin Trend flat(0.21, 0.25, 0.24), EKG: LVH, ST depression V456.  Likely from HTN emergency, however, she does report chest tightness when walking up the stairs and it resolves with rest.  She certainly has risk factors for CAD: tobacco, HTN, PAD, DM.  I think we should do a Lexiscan stress test tomorrow.   Echo pending.   PAD SP fem-fem bypass and stenting in ~2004-2006.  She was followed by Dr. Allyson Sabal in the past who did the stent.  She may be having claudication.  She said it is hard to tell with her back  problems.  She certainly has poor distal pulses and will need ABIs in the office.    LOS: 2 days    HAGER, BRYAN PA-C 02/25/2015 10:00 AM  I have seen and examined the patient along with HAGER, BRYAN PA-C.  I have reviewed the chart, notes and new data.  I agree with PA''s note.  Key new complaints: she seems to describe exertional angina pectoris, discomfort in left shoulder blade area after climbing steps, relieved by rest; NYHA class 2-3 CHF symptoms; 150 feet intermittent claudication, R>L. Also has lumbar spine disease, but it sounds like she is describing arterial claudication Key examination changes: no S3, no JVD, no rales, displaced apical impulse, absent pedal pulses, but warm toes, no cyanosis/pallor Key new findings / data: "plateau"  elevation in troponin suggests CHF rather than true acute coronary sd.  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  Has symptoms of acute on chronic heart failure (EF unknown at this point). Hard to distinguish relative contribution of COPD and CHF, suspect both are operating. May have ischemic cardiomyopathy and/or hypertensive heart disease with heart failure. Has symptoms of lower extremity arterial insufficiency, but no signs of threatened limb. Multiple risk factors for vascular disease are not yet adequately addressed.  Echo. Lexiscan Myoview. Cath if abnormal. Smoking cessation. Better glycemic control. Target LDL<70. Prefer carvedilol to selective B-blocker. She reports poor compliance with f/u and meds due to financial issues and difficulty affording insurance copays/deductibles. May benefit from social worker/business office assistance and payment plan options.  Thurmon FairMihai Anmol Paschen, MD, [facility] Center[facility] [phone] 02/25/2015, 11:42 AM

## 2015-02-25 NOTE — Progress Notes (Signed)
[facility] PROGRESS NOTE  Katherine Williams:[id number] DOB: [birth year] DOA: 02/23/2015 PCP: Dwana Melena, MD  Assessment/Plan: 1. Acute Hypoxic resp failure: On admission  -significantly improved. Good oxygen saturation on room air  -process appears to be secondary to COPD exacerbation and CHF/Hypertensive urgency - will continue therapy with steroids (tapering now as patient no wheezing), nebulizer treatment and by mouth Levaquin  -CXR clear -No JVD, no crackles and no lower extremity swellings  -Will hold on further  Lasix   2. Elevated troponin -likely demand ischemia due to #1, has multiple cardiac risk factors. Heart score 4-5 -Echo: Without any regional wall motion abnormalities and preserved ejection fraction  - patient is on ASA/Coreg already  -On before and planning to perform a stress test on 1/12. Pending results she might not need a left heart cath during this admission. -Patient is chest pain-free currently  3. Accelerated HTN -Slowly improving, but is sealed out of control -Will continue amlodipine, Cozaar and Coreg (last one increased to 12.5 twice a day for better control). -Will also add hydralazine 25 mg 3 times a day. add amlodipine, continue coreg/cozaar  4. DM -Continue Insulin pump and glipizide  -Patient with elevated CBGs secondary to steroids use -Given improvement in her breathing we are going to obtain a fast steroids tapering -will follow clinical response and if needed increase patient basal insulin coverage through her pump (she is able to manipulate settings)  5. Tobacco abuse -Cessation counseling provided -Continue nicotine patch  6. GERD -Will continue PPI  DVT proph: On lovenox  Code Status: Full Code Family Communication: no family at bedside Disposition Plan: Home when improved   Procedures:  2-D echo - Left ventricle: The cavity size was normal. Wall thickness was increased in a pattern of moderate LVH. Systolic function  was normal. Wall motion was normal; there were no regional wall motion abnormalities. Doppler parameters are consistent with elevated ventricular end-diastolic filling pressure. - Mitral valve: Eccentric MR directed anteriorly but no obvious prolapse. There was mild regurgitation. - Left atrium: The atrium was mildly dilated. - Atrial septum: No defect or patent foramen ovale was identified. - Pulmonary arteries: PA peak pressure: 43 mm Hg (S).  Consultations: Cardiology service  HPI/Subjective: Afebrile, no chest pain, significant improvement in her shortness of breath. Currently without any nausea, vomiting, abdominal pain, or any other acute complaints. Blood pressure continue to be uncontrolled.  Objective: Filed Vitals:   02/25/15 1149 02/25/15 1540  BP: 181/75 197/67  Pulse: 98 98  Temp: 98.6 F (37 C) 97.5 F (36.4 C)  Resp: 20 18    Intake/Output Summary (Last 24 hours) at 02/25/15 1830 Last data filed at 02/25/15 1300  Gross per 24 hour  Intake   1080 ml  Output      0 ml  Net   1080 ml   Filed Weights   02/23/15 2117 02/24/15 1216 02/25/15 0410  Weight: 103.874 kg (229 lb) 103.3 kg (227 lb 11.8 oz) 103.103 kg (227 lb 4.8 oz)    Exam:   General: AAOx3, denies chest pain, nausea, vomiting, headaches or any other acute complaints. Patient reports improvement in her breathing. She is afebrile.  Cardiovascular: S1S2/RRR, no murmurs, no gallops, no rubs   Respiratory: scattered rhonchi, no wheezing, improved air movement   Abdomen: soft, NT, BS present  Musculoskeletal: no edema c/c   Data Reviewed: Basic Metabolic Panel:  Recent Labs Lab 02/23/15 2135 02/24/15 0313 02/25/15 0406  NA 135  --  134*  K 3.2*  --  3.8  CL 96*  --  96*  CO2 26  --  28  GLUCOSE 160*  --  304*  BUN 6  --  18  CREATININE 0.80 0.94 0.96  CALCIUM 9.6  --  9.4  MG  --  2.7*  --   PHOS  --  3.1  --    Liver Function Tests:  Recent Labs Lab 02/23/15 2135  AST  100*  ALT 56*  ALKPHOS 78  BILITOT 0.6  PROT 7.8  ALBUMIN 3.7   CBC:  Recent Labs Lab 02/23/15 2135 02/24/15 0313 02/25/15 0406  WBC 8.5 9.7 13.2*  NEUTROABS 6.5  --   --   HGB 15.0 14.0 14.1  HCT 46.5* 43.6 43.5  MCV 79.5 80.3 78.7  PLT 289 262 304   Cardiac Enzymes:  Recent Labs Lab 02/23/15 2135 02/24/15 0313 02/24/15 0959  TROPONINI 0.21* 0.25* 0.24*   BNP (last 3 results)  Recent Labs  02/23/15 2135  BNP 258.5*   CBG:  Recent Labs Lab 02/24/15 1615 02/24/15 2123 02/25/15 0741 02/25/15 1147 02/25/15 1656  GLUCAP 212* 310* 376* 276* 246*    Recent Results (from the past 240 hour(s))  MRSA PCR Screening     Status: None   Collection Time: 02/24/15 12:53 PM  Result Value Ref Range Status   MRSA by PCR NEGATIVE NEGATIVE Final    Comment:        The GeneXpert MRSA Assay (FDA approved for NASAL specimens only), is one component of a comprehensive MRSA colonization surveillance program. It is not intended to diagnose MRSA infection nor to guide or monitor treatment for MRSA infections.      Studies: Dg Chest Port 1 View  02/23/2015  CLINICAL DATA:  65 year old female with shortness of breath EXAM: PORTABLE CHEST 1 VIEW COMPARISON:  Chest CT dated 07/29/2004 FINDINGS: Single-view of the chest does not demonstrate a focal consolidation. There is no pleural effusion or pneumothorax. There is mild cardiomegaly. The osseous structures are grossly unremarkable. IMPRESSION: No active disease. Cardiomegaly. Electronically Signed   By: Elgie CollardArash  Radparvar M.D.   On: 02/23/2015 22:22    Scheduled Meds: . amLODipine  10 mg Oral Daily  . aspirin  324 mg Oral Daily  . carvedilol  6.25 mg Oral BID WC  . enoxaparin (LOVENOX) injection  40 mg Subcutaneous Q24H  . glipiZIDE  10 mg Oral Q breakfast  . insulin pump   Subcutaneous TID AC, HS, 0200  . levofloxacin  750 mg Oral Daily  . losartan  50 mg Oral Daily  . pantoprazole  40 mg Oral Daily  . [START ON  02/26/2015] predniSONE  40 mg Oral Q breakfast   Continuous Infusions:  Antibiotics Given (last 72 hours)    Date/Time Action Medication Dose   02/24/15 0805 Given   levofloxacin (LEVAQUIN) tablet 750 mg 750 mg   02/25/15 0857 Given   levofloxacin (LEVAQUIN) tablet 750 mg 750 mg      Principal Problem:   COPD exacerbation (HCC) Active Problems:   Diabetes mellitus, type 2 (HCC)   TOBACCO ABUSE   Essential hypertension   GERD   Asthma exacerbation   Hypertensive urgency   Hypokalemia   Elevated troponin    Time spent: 25 min    Vassie LollMadera, [facility]  [facility] Pager [phone]. If 7PM-7AM, please contact night-coverage at [email or web address], password [email or web address] 02/25/2015, 6:30 PM  LOS: 2 days

## 2015-02-25 NOTE — Progress Notes (Signed)
  Echocardiogram 2D Echocardiogram has been performed.  Katherine SavoyCasey N Aubryanna Williams 02/25/2015, 2:05 PM

## 2015-02-26 ENCOUNTER — Inpatient Hospital Stay (HOSPITAL_COMMUNITY): Payer: Medicare Other

## 2015-02-26 ENCOUNTER — Other Ambulatory Visit: Payer: Self-pay | Admitting: Physician Assistant

## 2015-02-26 DIAGNOSIS — R079 Chest pain, unspecified: Secondary | ICD-10-CM | POA: Insufficient documentation

## 2015-02-26 DIAGNOSIS — E876 Hypokalemia: Secondary | ICD-10-CM

## 2015-02-26 DIAGNOSIS — I739 Peripheral vascular disease, unspecified: Secondary | ICD-10-CM

## 2015-02-26 LAB — NM MYOCAR MULTI W/SPECT W/WALL MOTION / EF
CHL CUP NUCLEAR SRS: 7
CHL CUP RESTING HR STRESS: 68 {beats}/min
CSEPEDS: 18 s
CSEPEW: 1 METS
CSEPPHR: 103 {beats}/min
Exercise duration (min): 5 min
LVDIAVOL: 150 mL
LVSYSVOL: 85 mL
NUC STRESS TID: 1.17
RATE: 0.09
SDS: 6
SSS: 13

## 2015-02-26 LAB — GLUCOSE, CAPILLARY
Glucose-Capillary: 111 mg/dL — ABNORMAL HIGH (ref 65–99)
Glucose-Capillary: 244 mg/dL — ABNORMAL HIGH (ref 65–99)
Glucose-Capillary: 123 mg/dL — ABNORMAL HIGH (ref 65–99)

## 2015-02-26 MED ORDER — FLUTICASONE-SALMETEROL 250-50 MCG/DOSE IN AEPB
1.0000 | INHALATION_SPRAY | Freq: Two times a day (BID) | RESPIRATORY_TRACT | Status: DC
Start: 2015-02-26 — End: 2015-04-22

## 2015-02-26 MED ORDER — PANTOPRAZOLE SODIUM 40 MG PO TBEC: 40.0000 mg | DELAYED_RELEASE_TABLET | Freq: Every day | ORAL | Status: DC

## 2015-02-26 MED ORDER — TECHNETIUM TC 99M SESTAMIBI GENERIC - CARDIOLITE
30.0000 | Freq: Once | INTRAVENOUS | Status: AC | PRN
  Administered 2015-02-26: 30 via INTRAVENOUS

## 2015-02-26 MED ORDER — CARVEDILOL 12.5 MG PO TABS: 12.5000 mg | ORAL_TABLET | Freq: Two times a day (BID) | ORAL | Status: DC

## 2015-02-26 MED ORDER — AMLODIPINE BESYLATE 10 MG PO TABS: 10.0000 mg | ORAL_TABLET | Freq: Every day | ORAL | Status: AC

## 2015-02-26 MED ORDER — REGADENOSON 0.4 MG/5ML IV SOLN
0.4000 mg | Freq: Once | INTRAVENOUS | Status: AC
  Administered 2015-02-26: 0.4 mg via INTRAVENOUS
  Filled 2015-02-26: qty 5

## 2015-02-26 MED ORDER — LOSARTAN POTASSIUM 100 MG PO TABS: 100.0000 mg | ORAL_TABLET | Freq: Every day | ORAL | Status: AC

## 2015-02-26 MED ORDER — REGADENOSON 0.4 MG/5ML IV SOLN
INTRAVENOUS | Status: AC
  Filled 2015-02-26: qty 5

## 2015-02-26 MED ORDER — FLUTICASONE PROPIONATE 50 MCG/ACT NA SUSP
2.0000 | Freq: Every day | NASAL | Status: DC
  Administered 2015-02-26: 2 via NASAL
  Filled 2015-02-26: qty 16

## 2015-02-26 MED ORDER — LEVOFLOXACIN 750 MG PO TABS: 750.0000 mg | ORAL_TABLET | Freq: Every day | ORAL | Status: DC

## 2015-02-26 MED ORDER — FLUTICASONE PROPIONATE 50 MCG/ACT NA SUSP: 2.0000 | Freq: Every day | NASAL | Status: DC

## 2015-02-26 MED ORDER — PREDNISONE 20 MG PO TABS: ORAL_TABLET | ORAL | Status: DC

## 2015-02-26 MED ORDER — HYDRALAZINE HCL 25 MG PO TABS: 25.0000 mg | ORAL_TABLET | Freq: Three times a day (TID) | ORAL | Status: AC

## 2015-02-26 MED ORDER — TECHNETIUM TC 99M SESTAMIBI GENERIC - CARDIOLITE
10.0000 | Freq: Once | INTRAVENOUS | Status: AC | PRN
  Administered 2015-02-26: 10 via INTRAVENOUS

## 2015-02-26 MED ORDER — LOSARTAN POTASSIUM 50 MG PO TABS: 100.0000 mg | ORAL_TABLET | Freq: Every day | ORAL | Status: DC

## 2015-02-26 MED ORDER — NICOTINE 14 MG/24HR TD PT24: 14.0000 mg | MEDICATED_PATCH | Freq: Every day | TRANSDERMAL | Status: DC | PRN

## 2015-02-26 NOTE — Discharge Summary (Signed)
Physician Discharge Summary  Katherine Williams ZOX:[id number] DOB: 1950/07/15 DOA: 02/23/2015  PCP: Dwana Melena, MD  Admit date: 02/23/2015 Discharge date: 02/26/2015  Time spent: 35 minutes  Recommendations for Outpatient Follow-up:  1-repeat BMET to follow electrolytes and renal function 2-reassess BP and adjust antihypertensive regimen as needed  3-reassess volume status and if indicated start loop diuretic 4-referral to pulmonary service for PFT's and further adjustments/assistance on maintenance therapy indicated.  Discharge Diagnoses:  Principal Problem:   COPD exacerbation (HCC) Active Problems:   Diabetes mellitus, type 2 (HCC)   TOBACCO ABUSE   Essential hypertension   GERD   Asthma exacerbation   Hypertensive urgency   Hypokalemia   Elevated troponin   Chest pain   Controlled type 2 diabetes mellitus without complication, with long-term current use of insulin (HCC)   Discharge Condition: stable and improved. Will follow up with PCP in 10 days. Medication adjusted and taken from 4 dollars menu (to help with compliance). Will follow up with cardiology as an outpatient.  Diet recommendation: heart healthy/low sodium diet  Filed Weights   02/24/15 1216 02/25/15 0410 02/26/15 0602  Weight: 103.3 kg (227 lb 11.8 oz) 103.103 kg (227 lb 4.8 oz) 102.785 kg (226 lb 9.6 oz)    History of present illness:  64 y.o. female with a past medical history of type 2 diabetes, asthma, tobacco use disorder, hypertension, ganglion cyst, heart murmur who comes to the ED via EMS due to worsening of shortness of breath.  Per patient, since Friday evening, wheezing, productive cough of whitish sputum (occasional yellowish sputum since today), fatigue and malaise which proceeded to cold like symptoms since late December. She smokes about a pack of cigarettes a day, but has not smoked since Friday due to worsening of her symptoms. She states that she try her bronchodilators inhalers at home, without any  relief. She denies fever, but complains of chills, fatigue and malaise. She denies chest pain, palpitations, dizziness, diaphoresis, orthopnea, but complains of mild lower extremity edema since earlier today.  In the ER, the patient was noticed to have a systolic blood pressure over 200 mmHg, but states that she had not taken her antihypertensive medication earlier in the day. Her dyspnea improved significantly, after she received bronchodilators, supplemental oxygen and methylprednisolone IVP.  Hospital Course:  1. Acute Hypoxic resp failure: On admission  -significantly improved/resolved at discharge. Good oxygen saturation on room air  -process appears to be secondary to COPD exacerbation and Hypertensive urgency - will continue therapy with steroids (tapering now as patient no wheezing), inhaler treatment and by mouth Levaquin  -CXR clear -No JVD, no crackles and no lower extremity swellings at discharge -Will hold on furtherLasix treatment for now -reevaluate BP and volume status during follow up visit -advise to quit smoking   2. Elevated troponin/atypical chest pain -likely demand ischemia due to #1, has multiple cardiac risk factors. (Heart score 4-5) -Echo: Without any regional wall motion abnormalities and preserved ejection fraction  -patient will continue ASA, cozaar and Coreg; also started on hydralazine   -Cardiology consulted and recommended [facility], which demonstrated to be low risk -patient to continue medical management and medication compliance -advise to quit smoking -Patient was chest pain-free at discharge  3. Accelerated HTN -much improved at discharge -SBP in 150's-160's range -Will continue amlodipine, Cozaar and Coreg (last one increased to 12.5 twice a day for better control). -Also added hydralazine 25 mg 3 times a day. -advise to be compliant with medications, to quit  smoking and to follow low sodium diet  -further adjustments to be done as an  outpatient depending BP fluctuation   4. DM -Continue Insulin pump and glipizide as previously taken -Patient with elevated CBGs secondary to steroids use -advise to follow low carb diet -further adjustment to her hypoglycemic regimen to be done as an outpatient as needed   5. Tobacco abuse -Cessation counseling provided -advise to Continue nicotine patch (prescription given)  6. GERD -Will continue PPI  7. PAd:  -will follow up with Dr. Allyson SabalBerry as an outpatient for arterial dopplers and further treatment -advise to quit smoking (imperative) -and will control BP better.   Procedures:   2-D echo: - Left ventricle: The cavity size was normal. Wall thickness was increased in a pattern of moderate LVH. Systolic function was normal. Wall motion was normal; there were no regional wall motion abnormalities. Doppler parameters are consistent with elevated ventricular end-diastolic filling pressure. - Mitral valve: Eccentric MR directed anteriorly but no obvious prolapse. There was mild regurgitation. - Left atrium: The atrium was mildly dilated. - Atrial septum: No defect or patent foramen ovale was identified. - Pulmonary arteries: PA peak pressure: 43 mm Hg (S).   Myoview: low risk myoview    Consultations:  Cardiology service   Discharge Exam: Filed Vitals:   02/26/15 0933 02/26/15 1420  BP: 141/83 166/68  Pulse: 81 59  Temp:  97.6 F (36.4 C)  Resp:  18    General: AAOx3, denies palpitations, nausea, vomiting, headaches or any other acute complaints. Patient reports no SOB and is currently CP free. She is afebrile.  Cardiovascular: S1S2/RRR, no murmurs, no gallops, no rubs   Respiratory: scattered rhonchi, no wheezing, improved air movement   Abdomen: soft, NT, BS present  Musculoskeletal: no edema c/c   Discharge Instructions   Discharge Instructions    Diet - low sodium heart healthy    Complete by:  As directed      Discharge  instructions    Complete by:  As directed   Follow low sodium diet and low carbohydrates Stop smoking  Maintain adequate hydration Take medications as prescribed Follow up with PCP in 10 days Follow up with cardiology service as instructed          Current Discharge Medication List    START taking these medications   Details  amLODipine (NORVASC) 10 MG tablet Take 1 tablet (10 mg total) by mouth daily. Qty: 30 tablet, Refills: 1    fluticasone (FLONASE) 50 MCG/ACT nasal spray Place 2 sprays into both nostrils daily. Qty: 16 g, Refills: 2    Fluticasone-Salmeterol (ADVAIR DISKUS) 250-50 MCG/DOSE AEPB Inhale 1 puff into the lungs every 12 (twelve) hours. Qty: 60 each, Refills: 1    hydrALAZINE (APRESOLINE) 25 MG tablet Take 1 tablet (25 mg total) by mouth every 8 (eight) hours. Qty: 90 tablet, Refills: 1    levofloxacin (LEVAQUIN) 750 MG tablet Take 1 tablet (750 mg total) by mouth daily. Qty: 3 tablet, Refills: 0    nicotine (NICODERM CQ - DOSED IN MG/24 HOURS) 14 mg/24hr patch Place 1 patch (14 mg total) onto the skin daily as needed (For nicotine withdrawal symptoms). Qty: 28 patch, Refills: 0    pantoprazole (PROTONIX) 40 MG tablet Take 1 tablet (40 mg total) by mouth daily. Qty: 30 tablet, Refills: 1    predniSONE (DELTASONE) 20 MG tablet Take 2 tablets by mouth daily x 2 days; then 1 tablet by mouth daily x 2 days; then 1/2  tablet by mouth daily X 3 days and stop prednisone. Qty: 10 tablet, Refills: 0      CONTINUE these medications which have CHANGED   Details  carvedilol (COREG) 12.5 MG tablet Take 1 tablet (12.5 mg total) by mouth 2 (two) times daily with a meal. Qty: 60 tablet, Refills: 1    losartan (COZAAR) 100 MG tablet Take 1 tablet (100 mg total) by mouth daily. Qty: 30 tablet, Refills: 1      CONTINUE these medications which have NOT CHANGED   Details  albuterol (PROVENTIL HFA;VENTOLIN HFA) 108 (90 Base) MCG/ACT inhaler Inhale 2 puffs into the lungs  every 6 (six) hours as needed for wheezing or shortness of breath.    CINNAMON PO Take 1 tablet by mouth daily.    GARLIC PO Take 1 tablet by mouth daily.    glipiZIDE (GLUCOTROL XL) 10 MG 24 hr tablet Take 10 mg by mouth daily with breakfast.    Multiple Vitamin (MULTIVITAMIN WITH MINERALS) TABS tablet Take 1 tablet by mouth daily.    Red Yeast Rice Extract (RED YEAST RICE PO) Take 1 tablet by mouth daily.       Allergies  Allergen Reactions  . Lisinopril-Hydrochlorothiazide Hives and Itching  . Sulfonamide Derivatives Other (See Comments)    Hives   Follow-up Information    Follow up with [facility]. Go on 03/11/2015.   Specialty:  Cardiology   Why:  @8 :00am for LE arterial doppler   Contact information:   [address] [place] [address] [phone]      Follow up with Nanetta Batty, MD. Go on 03/18/2015.   Specialties:  Cardiology, Radiology   Why:  @10 :00am for PAD evaluation   Contact information:   [address] [place] [address] [phone]       Follow up with Dwana Melena, MD. Schedule an appointment as soon as possible for a visit in 10 days.   Specialty:  Internal Medicine   Contact information:   [address] [place] [address] [phone]       The results of significant diagnostics from this hospitalization (including imaging, microbiology, ancillary and laboratory) are listed below for reference.    Significant Diagnostic Studies: Nm Myocar Multi W/spect W/wall Motion / Ef  02/26/2015   There was no ST segment deviation noted during stress.  No T wave inversion was noted during stress.  Nuclear stress EF: 43%.  This is a low risk study.  The left ventricular ejection fraction is moderately decreased (30-44%).  Low risk stress nuclear study with a small, moderate intensity, fixed inferior defect consistent with prior infarct; no ischemia; EF 43 but visually appears better;  suggest echo to further assess; mild LVE; inferior hypokinesis.   Dg Chest Port 1 View  02/23/2015  CLINICAL DATA:  65 year old female with shortness of breath EXAM: PORTABLE CHEST 1 VIEW COMPARISON:  Chest CT dated 07/29/2004 FINDINGS: Single-view of the chest does not demonstrate a focal consolidation. There is no pleural effusion or pneumothorax. There is mild cardiomegaly. The osseous structures are grossly unremarkable. IMPRESSION: No active disease. Cardiomegaly. Electronically Signed   By: Elgie Collard M.D.   On: 02/23/2015 22:22    Microbiology: Recent Results (from the past 240 hour(s))  MRSA PCR Screening     Status: None   Collection Time: 02/24/15 12:53 PM  Result Value Ref Range Status   MRSA by PCR NEGATIVE NEGATIVE Final    Comment:  The GeneXpert MRSA Assay (FDA approved for NASAL specimens only), is one component of a comprehensive MRSA colonization surveillance program. It is not intended to diagnose MRSA infection nor to guide or monitor treatment for MRSA infections.      Labs: Basic Metabolic Panel:  Recent Labs Lab 02/23/15 2135 02/24/15 0313 02/25/15 0406  NA 135  --  134*  K 3.2*  --  3.8  CL 96*  --  96*  CO2 26  --  28  GLUCOSE 160*  --  304*  BUN 6  --  18  CREATININE 0.80 0.94 0.96  CALCIUM 9.6  --  9.4  MG  --  2.7*  --   PHOS  --  3.1  --    Liver Function Tests:  Recent Labs Lab 02/23/15 2135  AST 100*  ALT 56*  ALKPHOS 78  BILITOT 0.6  PROT 7.8  ALBUMIN 3.7   CBC:  Recent Labs Lab 02/23/15 2135 02/24/15 0313 02/25/15 0406  WBC 8.5 9.7 13.2*  NEUTROABS 6.5  --   --   HGB 15.0 14.0 14.1  HCT 46.5* 43.6 43.5  MCV 79.5 80.3 78.7  PLT 289 262 304   Cardiac Enzymes:  Recent Labs Lab 02/23/15 2135 02/24/15 0313 02/24/15 0959  TROPONINI 0.21* 0.25* 0.24*   BNP: BNP (last 3 results)  Recent Labs  02/23/15 2135  BNP 258.5*   CBG:  Recent Labs Lab 02/25/15 1147 02/25/15 1656 02/25/15 2139  02/26/15 0735 02/26/15 1128  GLUCAP 276* 246* 192* 111* 244*    Signed:  Vassie Loll MD.  [facility] 02/26/2015, 4:16 PM

## 2015-02-26 NOTE — Progress Notes (Addendum)
Patient presented for Lexiscan. Tolerated procedure well. Result to follow. One day study.   Juliauna Stueve, PAC 

## 2015-02-26 NOTE — Care Management Important Message (Signed)
Important Message  Patient Details  Name: Katherine GinsbergKathy M Williams MRN: [id number] Date of Birth: 17-Aug-1950   Medicare Important Message Given:  Yes    Katherine BalzarineShealy, Rei Medlen Abena 02/26/2015, 12:18 PM

## 2015-02-26 NOTE — Progress Notes (Signed)
This note also relates to the following rows which could not be included: ECG Heart Rate - Cannot attach notes to unvalidated device data   Pt states she is short of breath with exertion, just returned from bathroom. Pt states she is scared that she will not be able to breath once Lexiscan is injected. 2LNC 98% O2 saturation

## 2015-02-26 NOTE — Progress Notes (Signed)
Patient Name: Katherine Williams Date of Encounter: 02/26/2015   SUBJECTIVE  Movement brings chest discomfort and sob. Seen in nuc med  CURRENT MEDS . amLODipine  10 mg Oral Daily  . aspirin  324 mg Oral Daily  . carvedilol  12.5 mg Oral BID WC  . enoxaparin (LOVENOX) injection  40 mg Subcutaneous Q24H  . glipiZIDE  10 mg Oral Q breakfast  . hydrALAZINE  25 mg Oral 3 times per day  . insulin pump   Subcutaneous TID AC, HS, 0200  . levofloxacin  750 mg Oral Daily  . losartan  50 mg Oral Daily  . pantoprazole  40 mg Oral Daily  . predniSONE  40 mg Oral Q breakfast  . regadenoson        OBJECTIVE  Filed Vitals:   02/26/15 0908 02/26/15 0929 02/26/15 0931 02/26/15 0933  BP: 170/81 145/92 144/89 141/83  Pulse: 66 101 84 81  Temp:      TempSrc:      Resp:      Height:      Weight:      SpO2: 98%       Intake/Output Summary (Last 24 hours) at 02/26/15 0934 Last data filed at 02/25/15 1300  Gross per 24 hour  Intake    720 ml  Output      0 ml  Net    720 ml   Filed Weights   02/24/15 1216 02/25/15 0410 02/26/15 0602  Weight: 227 lb 11.8 oz (103.3 kg) 227 lb 4.8 oz (103.103 kg) 226 lb 9.6 oz (102.785 kg)    PHYSICAL EXAM  General: Pleasant, NAD. Neuro: Alert and oriented X 3. Moves all extremities spontaneously. Psych: Normal affect. HEENT:  Normal  Neck: Supple without bruits or JVD. Lungs:  Resp regular and unlabored, CTA. Heart: RRR no s3, s4, or murmurs. Abdomen: Soft, non-tender, non-distended, BS + x 4.  Extremities: No clubbing, cyanosis or edema. DP/PT/Radials 2+ and equal bilaterally.  Accessory Clinical Findings  CBC  Recent Labs  02/23/15 2135 02/24/15 0313 02/25/15 0406  WBC 8.5 9.7 13.2*  NEUTROABS 6.5  --   --   HGB 15.0 14.0 14.1  HCT 46.5* 43.6 43.5  MCV 79.5 80.3 78.7  PLT 289 262 304   Basic Metabolic Panel  Recent Labs  02/23/15 2135 02/24/15 0313 02/25/15 0406  NA 135  --  134*  K 3.2*  --  3.8  CL 96*  --  96*  CO2 26   --  28  GLUCOSE 160*  --  304*  BUN 6  --  18  CREATININE 0.80 0.94 0.96  CALCIUM 9.6  --  9.4  MG  --  2.7*  --   PHOS  --  3.1  --    Liver Function Tests  Recent Labs  02/23/15 2135  AST 100*  ALT 56*  ALKPHOS 78  BILITOT 0.6  PROT 7.8  ALBUMIN 3.7   No results for input(s): LIPASE, AMYLASE in the last 72 hours. Cardiac Enzymes  Recent Labs  02/23/15 2135 02/24/15 0313 02/24/15 0959  TROPONINI 0.21* 0.25* 0.24*    TELE   Unable to review as patient seen in nuc med.   Radiology/Studies  Dg Chest Port 1 View  02/23/2015  CLINICAL DATA:  65 year old female with shortness of breath EXAM: PORTABLE CHEST 1 VIEW COMPARISON:  Chest CT dated 07/29/2004 FINDINGS: Single-view of the chest does not demonstrate a focal consolidation. There is no pleural effusion  or pneumothorax. There is mild cardiomegaly. The osseous structures are grossly unremarkable. IMPRESSION: No active disease. Cardiomegaly. Electronically Signed   By: Elgie Collard M.D.   On: 02/23/2015 22:22    ASSESSMENT AND PLAN Principal Problem:   COPD exacerbation (HCC) Active Problems:   Diabetes mellitus, type 2 (HCC)   TOBACCO ABUSE   Essential hypertension   GERD   Asthma exacerbation   Hypertensive urgency   Hypokalemia   Elevated troponin   Chest pain   Controlled type 2 diabetes mellitus without complication, with long-term current use of insulin (HCC)   Plan: flat troponin trend. BP elevated prior to test 170/81, however improved during test to 141/80.   Pending myoview. If abnormal plan for cath.   Signed, Bhagat,Bhavinkumar PA-C Pager [phone]  I have seen and examined the patient along with Bhagat,Bhavinkumar PA-C.  I have reviewed the chart, notes and new data.  I agree with PA's note.  No angina, but still with exercise related dyspnea. BP control improving, not yet optimal. So far tolerating carvedilol without wheezing. Echo findings are consistent with acute on chronic diastolic  HF and there are no regional wall motion abnormalities.  ECHO 02/25/15 - Left ventricle: The cavity size was normal. Wall thickness wasincreased in a pattern of moderate LVH. Systolic function was normal. Wall motion was normal; there were no regional wallmotion abnormalities. Doppler parameters are consistent with elevated ventricular end-diastolic filling pressure. - Mitral valve: Eccentric MR directed anteriorly but no obviousprolapse. There was mild regurgitation. - Left atrium: The atrium was mildly dilated. - Atrial septum: No defect or patent foramen ovale was identified. - Pulmonary arteries: PA peak pressure: 43 mm Hg (S).  PLAN: If nuclear study is normal, no plan for coronary angio and possible DC later today. Focus on optimal control of BP and other cardiac risk factors, keeping in mind that she has had limited ability to comply with meds due to financial limitations. Try to stick to generic inexpensive agents. Will need outpatient LE arterial Dopplers for right leg claudication, history of PAD and previous revascularization. Increase ARB to 100 mg daily. Will probably need low dose daily loop diuretic. Stop smoking - critically important.  If she has ischemia on nuclear study, plan cardiac cath in AM (and hold ARB and diuretic until after cath).  Thurmon Fair, MD, [facility] [phone] 02/26/2015, 11:24 AM

## 2015-02-26 NOTE — Progress Notes (Signed)
Low risk Myoview. Ok to discharge. F/u has been schedule for PAD evaluation with Dr. Allyson SabalBerry. Referred by Dr. Royann Shiversroitoru (primary cardiologist - [place]) See progress note for further recommendation.   Carime Dinkel, PAC

## 2015-02-26 NOTE — Care Management Note (Signed)
Case Management Note  Patient Details  Name: Katherine Williams MRN: [id number] Date of Birth: 28-Feb-1950  Subjective/Objective:                  Pt admitted for COPD Exacerbation. Pt is from home with family support. Plan for d/c home today. Pt is with insurance and is having difficulty affording medications.   Action/Plan:CM did speak with pt in regards to medications. CM did make her aware since pt has insurance, CM will not be able to assist. CM did make pt aware of the [facility] $4.00 list and [facility] for diabetic medications even though the cost has increased. No further needs from CM at this time.   Expected Discharge Date:                  Expected Discharge Plan:  Home/Self Care  In-House Referral:  NA  Discharge planning Services  CM Consult, Medication Assistance  Post Acute Care Choice:  NA Choice offered to:  NA  DME Arranged:  N/A DME Agency:  NA  HH Arranged:  NA HH Agency:  NA  Status of Service:  Completed, signed off  Medicare Important Message Given:  Yes Date Medicare IM Given:    Medicare IM give by:    Date Additional Medicare IM Given:    Additional Medicare Important Message give by:     If discussed at Long Length of Stay Meetings, dates discussed:    Additional Comments:  Katherine Williams, [name], RN 02/26/2015, 3:38 PM

## 2015-02-28 DIAGNOSIS — J45998 Other asthma: Secondary | ICD-10-CM | POA: Diagnosis not present

## 2015-03-03 ENCOUNTER — Other Ambulatory Visit: Payer: Self-pay | Admitting: Physician Assistant

## 2015-03-03 DIAGNOSIS — I739 Peripheral vascular disease, unspecified: Secondary | ICD-10-CM

## 2015-03-03 DIAGNOSIS — I779 Disorder of arteries and arterioles, unspecified: Secondary | ICD-10-CM

## 2015-03-11 ENCOUNTER — Inpatient Hospital Stay (HOSPITAL_COMMUNITY): Admit: 2015-03-11 | Payer: Self-pay

## 2015-03-17 ENCOUNTER — Ambulatory Visit (INDEPENDENT_AMBULATORY_CARE_PROVIDER_SITE_OTHER): Payer: Medicare Other | Admitting: Cardiovascular Disease

## 2015-03-17 ENCOUNTER — Encounter: Payer: Self-pay | Admitting: Cardiovascular Disease

## 2015-03-17 VITALS — BP 124/82 | HR 88 | Ht 64.0 in | Wt 230.0 lb

## 2015-03-17 DIAGNOSIS — I739 Peripheral vascular disease, unspecified: Secondary | ICD-10-CM

## 2015-03-17 NOTE — Assessment & Plan Note (Signed)
History of peripheral arterial disease status post stenting of her right leg as well as right femoropopliteal bypass grafting over a decade ago. She does complain of right to the left lower extremity claudication. She has diminished pedal pulses and scheduled for a lower extremity arterial Doppler study this coming Friday.

## 2015-03-17 NOTE — Progress Notes (Signed)
03/17/2015 Katherine Williams   1950/10/03  [id number]  Primary Physician Katherine Melena, MD Primary Cardiologist: Katherine Gess MD Katherine Williams   HPI:  Katherine Williams is a 65 year old moderately overweight widowed African-American female mother of 6 children, grandmother of 17 grandchildren who is referred by Dr. Royann Williams for peripheral vascular evaluation. Her primary care doctor is Dr. Dwana Williams in [place]. Risk factors include possibly 100-pack-year history of tobacco abuse currently smoking 5 cigarettes a day and trying to quit. She has a history of hypertension, hyperlipidemia and diabetes as well as COPD. She has never had a heart attack or stroke. She does have peripheral vascular disease status post stenting remotely and right femoropopliteal bypass graft. She does complain of lifestyle limiting claudication right greater than left at less than 100 feet. She was recently hospitalized several weeks ago for shortness of breath and COPD flare. Enzymes are mildly elevated. A Myoview stress test was low risk and 2-D echo showed normal LV function.   Current Outpatient Prescriptions  Medication Sig Dispense Refill  . albuterol (PROVENTIL HFA;VENTOLIN HFA) 108 (90 Base) MCG/ACT inhaler Inhale 2 puffs into the lungs every 6 (six) hours as needed for wheezing or shortness of breath.    Marland Kitchen amLODipine (NORVASC) 10 MG tablet Take 1 tablet (10 mg total) by mouth daily. 30 tablet 1  . carvedilol (COREG) 12.5 MG tablet Take 1 tablet (12.5 mg total) by mouth 2 (two) times daily with a meal. 60 tablet 1  . CINNAMON PO Take 1 tablet by mouth daily.    . fluticasone (FLONASE) 50 MCG/ACT nasal spray Place 2 sprays into both nostrils daily. 16 g 2  . Fluticasone-Salmeterol (ADVAIR DISKUS) 250-50 MCG/DOSE AEPB Inhale 1 puff into the lungs every 12 (twelve) hours. 60 each 1  . GARLIC PO Take 1 tablet by mouth daily.    Marland Kitchen glipiZIDE (GLUCOTROL XL) 10 MG 24 hr tablet Take 10 mg by mouth daily with  breakfast.    . hydrALAZINE (APRESOLINE) 25 MG tablet Take 1 tablet (25 mg total) by mouth every 8 (eight) hours. 90 tablet 1  . levofloxacin (LEVAQUIN) 750 MG tablet Take 1 tablet (750 mg total) by mouth daily. 3 tablet 0  . losartan (COZAAR) 100 MG tablet Take 1 tablet (100 mg total) by mouth daily. 30 tablet 1  . Multiple Vitamin (MULTIVITAMIN WITH MINERALS) TABS tablet Take 1 tablet by mouth daily.    . nicotine (NICODERM CQ - DOSED IN MG/24 HOURS) 14 mg/24hr patch Place 1 patch (14 mg total) onto the skin daily as needed (For nicotine withdrawal symptoms). 28 patch 0  . pantoprazole (PROTONIX) 40 MG tablet Take 1 tablet (40 mg total) by mouth daily. 30 tablet 1  . predniSONE (DELTASONE) 20 MG tablet Take 2 tablets by mouth daily x 2 days; then 1 tablet by mouth daily x 2 days; then 1/2 tablet by mouth daily X 3 days and stop prednisone. 10 tablet 0  . Red Yeast Rice Extract (RED YEAST RICE PO) Take 1 tablet by mouth daily.     No current facility-administered medications for this visit.    Allergies  Allergen Reactions  . Lisinopril-Hydrochlorothiazide Hives and Itching  . Sulfonamide Derivatives Other (See Comments)    Hives    Social History   Social History  . Marital Status: Widowed    Spouse Name: N/A  . Number of Children: N/A  . Years of Education: N/A   Occupational History  . Not on  file.   Social History Main Topics  . Smoking status: Current Every Day Smoker -- 1.00 packs/day for 45 years    Types: Cigarettes  . Smokeless tobacco: Never Used  . Alcohol Use: Yes     Comment: occasionally  . Drug Use: Yes    Special: Marijuana  . Sexual Activity: Not on file   Other Topics Concern  . Not on file   Social History Narrative     Review of Systems: General: negative for chills, fever, night sweats or weight changes.  Cardiovascular: negative for chest pain, dyspnea on exertion, edema, orthopnea, palpitations, paroxysmal nocturnal dyspnea or shortness of  breath Dermatological: negative for rash Respiratory: negative for cough or wheezing Urologic: negative for hematuria Abdominal: negative for nausea, vomiting, diarrhea, bright red blood per rectum, Williams, or hematemesis Neurologic: negative for visual changes, syncope, or dizziness All other systems reviewed and are otherwise negative except as noted above.    Blood pressure 124/82, pulse 88, height  (1.626 m), weight 230 lb (104.327 kg).  General appearance: alert and no distress Neck: no adenopathy, no carotid bruit, no JVD, supple, symmetrical, trachea midline and thyroid not enlarged, symmetric, no tenderness/mass/nodules Lungs: clear to auscultation bilaterally Heart: regular rate and rhythm, S1, S2 normal, no murmur, click, rub or gallop Extremities: extremities normal, atraumatic, no cyanosis or edema and diminished pedal pulses  EKG not performed today  ASSESSMENT AND PLAN:   PERIPHERAL VASCULAR DISEASE History of peripheral arterial disease status post stenting of her right leg as well as right femoropopliteal bypass grafting over a decade ago. She does complain of right to the left lower extremity claudication. She has diminished pedal pulses and scheduled for a lower extremity arterial Doppler study this coming Friday.      Katherine Gess MD FACP,FACC,FAHA, [facility] 03/17/2015 10:14 AM

## 2015-03-17 NOTE — Patient Instructions (Addendum)
Medication Instructions:  Your physician recommends that you continue on your current medications as directed. Please refer to the Current Medication list given to you today.   Labwork: none  Testing/Procedures: Keep scheduled appointment for Lower Extremity Dopplers on Friday, 03/20/2015  Follow-Up: Your physician recommends that you schedule a follow-up appointment in: 3 months with Dr. Salena Saner   Follow up with Dr. Allyson Sabal as needed/pending results of dopplers.   Any Other Special Instructions Will Be Listed Below (If Applicable).     If you need a refill on your cardiac medications before your next appointment, please call your pharmacy.

## 2015-03-18 ENCOUNTER — Ambulatory Visit: Payer: Self-pay | Admitting: Cardiovascular Disease

## 2015-03-19 DIAGNOSIS — I10 Essential (primary) hypertension: Secondary | ICD-10-CM | POA: Diagnosis not present

## 2015-03-19 DIAGNOSIS — E876 Hypokalemia: Secondary | ICD-10-CM | POA: Diagnosis not present

## 2015-03-19 DIAGNOSIS — E119 Type 2 diabetes mellitus without complications: Secondary | ICD-10-CM | POA: Diagnosis not present

## 2015-03-19 DIAGNOSIS — J449 Chronic obstructive pulmonary disease, unspecified: Secondary | ICD-10-CM | POA: Diagnosis not present

## 2015-03-19 DIAGNOSIS — R945 Abnormal results of liver function studies: Secondary | ICD-10-CM | POA: Diagnosis not present

## 2015-03-20 ENCOUNTER — Other Ambulatory Visit: Payer: Self-pay | Admitting: Physician Assistant

## 2015-03-20 ENCOUNTER — Ambulatory Visit (HOSPITAL_COMMUNITY)
Admission: RE | Admit: 2015-03-20 | Discharge: 2015-03-20 | Disposition: A | Discharge status: Home / Self Care | Payer: Medicare Other | Source: Ambulatory Visit | Attending: Cardiovascular Disease | Admitting: Cardiovascular Disease

## 2015-03-20 DIAGNOSIS — I739 Peripheral vascular disease, unspecified: Secondary | ICD-10-CM | POA: Insufficient documentation

## 2015-03-20 DIAGNOSIS — E119 Type 2 diabetes mellitus without complications: Secondary | ICD-10-CM | POA: Insufficient documentation

## 2015-03-20 DIAGNOSIS — I779 Disorder of arteries and arterioles, unspecified: Secondary | ICD-10-CM

## 2015-03-20 DIAGNOSIS — I10 Essential (primary) hypertension: Secondary | ICD-10-CM | POA: Insufficient documentation

## 2015-03-20 DIAGNOSIS — E785 Hyperlipidemia, unspecified: Secondary | ICD-10-CM | POA: Diagnosis not present

## 2015-03-20 DIAGNOSIS — Z95828 Presence of other vascular implants and grafts: Secondary | ICD-10-CM | POA: Diagnosis not present

## 2015-03-31 DIAGNOSIS — J45998 Other asthma: Secondary | ICD-10-CM | POA: Diagnosis not present

## 2015-04-09 DIAGNOSIS — I10 Essential (primary) hypertension: Secondary | ICD-10-CM | POA: Diagnosis not present

## 2015-04-09 DIAGNOSIS — E876 Hypokalemia: Secondary | ICD-10-CM | POA: Diagnosis not present

## 2015-04-09 DIAGNOSIS — J449 Chronic obstructive pulmonary disease, unspecified: Secondary | ICD-10-CM | POA: Diagnosis not present

## 2015-04-17 DIAGNOSIS — E1165 Type 2 diabetes mellitus with hyperglycemia: Secondary | ICD-10-CM | POA: Diagnosis not present

## 2015-04-22 ENCOUNTER — Ambulatory Visit (INDEPENDENT_AMBULATORY_CARE_PROVIDER_SITE_OTHER): Payer: Medicare Other | Admitting: Cardiovascular Disease

## 2015-04-22 ENCOUNTER — Encounter: Payer: Self-pay | Admitting: Cardiovascular Disease

## 2015-04-22 ENCOUNTER — Ambulatory Visit (HOSPITAL_COMMUNITY)
Admission: RE | Admit: 2015-04-22 | Discharge: 2015-04-22 | Disposition: A | Discharge status: Home / Self Care | Payer: Medicare Other | Source: Ambulatory Visit | Attending: Cardiovascular Disease | Admitting: Cardiovascular Disease

## 2015-04-22 VITALS — BP 140/82 | HR 72 | Ht 64.0 in | Wt 230.0 lb

## 2015-04-22 DIAGNOSIS — I739 Peripheral vascular disease, unspecified: Secondary | ICD-10-CM | POA: Insufficient documentation

## 2015-04-22 DIAGNOSIS — I771 Stricture of artery: Secondary | ICD-10-CM | POA: Diagnosis not present

## 2015-04-22 DIAGNOSIS — I70203 Unspecified atherosclerosis of native arteries of extremities, bilateral legs: Secondary | ICD-10-CM | POA: Diagnosis not present

## 2015-04-22 DIAGNOSIS — I110 Hypertensive heart disease with heart failure: Secondary | ICD-10-CM | POA: Insufficient documentation

## 2015-04-22 DIAGNOSIS — I509 Heart failure, unspecified: Secondary | ICD-10-CM | POA: Insufficient documentation

## 2015-04-22 DIAGNOSIS — E119 Type 2 diabetes mellitus without complications: Secondary | ICD-10-CM | POA: Diagnosis not present

## 2015-04-22 NOTE — Patient Instructions (Addendum)
Medication Instructions:  Your physician recommends that you continue on your current medications as directed. Please refer to the Current Medication list given to you today.   Labwork: Your physician recommends that you return for lab work in: 7 days before procedure The lab can be found on the FIRST FLOOR of out building in [address]   Testing/Procedures: Dr. Allyson SabalBerry has ordered a peripheral angi[facility] to be [facility].  This procedure is going to look at the bloodflow in your lower extremities.  If Dr. Allyson SabalBerry is able to open up the arteries, you will have to spend one night in the hospital.  If he is not able to open the arteries, you will be able to go home that same day.  SCHEDULE ON 3/23  After the procedure, you will not be allowed to drive for 3 days or push, pull, or lift anything greater than 10 lbs for one week.    You will be required to have the following tests prior to the procedure:  1. Blood work-the blood work can be done no more than 7 days prior to the procedure.  It can be done at any [facility].  There is one downstairs on the first floor of this building and one in the [facility] building [phone]([address])  2. Chest Xray-the chest xray order has already been placed at the [facility] Building.     *REPS: NONE  Puncture site : RIGHT       Any Other Special Instructions Will Be Listed Below (If Applicable).     If you need a refill on your cardiac medications before your next appointment, please call your pharmacy.

## 2015-04-22 NOTE — Progress Notes (Signed)
04/22/2015 [name]   08/03/1950  [id number]  Primary Physician Dwana Melena, MD Primary Cardiologist: Runell Gess MD [name]   HPI:  Mrs. Calles is a 65 year old moderately overweight widowed African-American female mother of 6 children, grandmother of 62 grandchildren who is referred by Dr. Royann Shivers for peripheral vascular evaluation. Her primary care doctor is Dr. Dwana Melena in [place]. I last saw her in the office 03/17/15.Risk factors include possibly 100-pack-year history of tobacco abuse currently smoking 5 cigarettes a day and trying to quit. She has a history of hypertension, hyperlipidemia and diabetes as well as COPD. She has never had a heart attack or stroke. She does have peripheral vascular disease status post stenting remotely and right femoropopliteal bypass graft. She does complain of lifestyle limiting claudication right greater than left at less than 100 feet. She was recently hospitalized several weeks ago for shortness of breath and COPD flare. Enzymes are mildly elevated. A Myoview stress test was low risk and 2-D echo showed normal LV function. She had Dopplers done 03/20/15 revealed a right ABI 0.35 and a left 0.47. 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   Current Outpatient Prescriptions  Medication Sig Dispense Refill  . amLODipine (NORVASC) 10 MG tablet Take 1 tablet (10 mg total) by mouth daily. 30 tablet 1  . carvedilol (COREG) 12.5 MG tablet Take 1 tablet (12.5 mg total) by mouth 2 (two) times daily with a meal. 60 tablet 1  . CINNAMON PO Take 1 tablet by mouth daily.    . fluticasone (FLONASE) 50 MCG/ACT nasal spray Place 2 sprays into both nostrils daily. 16 g 2  . GARLIC PO Take 1 tablet by mouth daily.    Marland Kitchen glipiZIDE (GLUCOTROL XL) 10 MG 24 hr tablet Take 10 mg by mouth daily with breakfast.    . hydrALAZINE  (APRESOLINE) 25 MG tablet Take 1 tablet (25 mg total) by mouth every 8 (eight) hours. 90 tablet 1  . levofloxacin (LEVAQUIN) 750 MG tablet Take 1 tablet (750 mg total) by mouth daily. 3 tablet 0  . losartan (COZAAR) 100 MG tablet Take 1 tablet (100 mg total) by mouth daily. 30 tablet 1  . Multiple Vitamin (MULTIVITAMIN WITH MINERALS) TABS tablet Take 1 tablet by mouth daily.    . pantoprazole (PROTONIX) 40 MG tablet Take 1 tablet (40 mg total) by mouth daily. 30 tablet 1  . Red Yeast Rice Extract (RED YEAST RICE PO) Take 1 tablet by mouth daily.     No current facility-administered medications for this visit.    Allergies  Allergen Reactions  . Lisinopril-Hydrochlorothiazide Hives and Itching  . Sulfonamide Derivatives Other (See Comments)    Hives    Social History   Social History  . Marital Status: Widowed    Spouse Name: N/A  . Number of Children: N/A  . Years of Education: N/A   Occupational History  . Not on file.   Social History Main Topics  . Smoking status: Current Every Day Smoker -- 1.00 packs/day for 45 years    Types: Cigarettes  . Smokeless tobacco: Never Used  . Alcohol Use: Yes     Comment: occasionally  . Drug Use: Yes    Special: Marijuana  . Sexual Activity: Not on file   Other Topics Concern  . Not on file   Social History Narrative  Review of Systems: General: negative for chills, fever, night sweats or weight changes.  Cardiovascular: negative for chest pain, dyspnea on exertion, edema, orthopnea, palpitations, paroxysmal nocturnal dyspnea or shortness of breath Dermatological: negative for rash Respiratory: negative for cough or wheezing Urologic: negative for hematuria Abdominal: negative for nausea, vomiting, diarrhea, bright red blood per rectum, melena, or hematemesis Neurologic: negative for visual changes, syncope, or dizziness All other systems reviewed and are otherwise negative except as noted above.    Blood pressure 140/82,  pulse 72, height 5\' 4"  (1.626 m), weight 230 lb (104.327 kg).  General appearance: alert and no distress Neck: no adenopathy, no carotid bruit, no JVD, supple, symmetrical, trachea midline and thyroid not enlarged, symmetric, no tenderness/mass/nodules Lungs: clear to auscultation bilaterally Heart: regular rate and rhythm, S1, S2 normal, no murmur, click, rub or gallop Extremities: extremities normal, atraumatic, no cyanosis or edema  EKG not performed today  ASSESSMENT AND PLAN:   PERIPHERAL VASCULAR DISEASE This Manson PasseyBrown returns today for follow-up of her Doppler studies. These were performed 03/20/15 revealed a right ABI 0.35 and a left 0.47. She has known occluded SFAs bilaterally. 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 This may be limiting inflow to her profunda vessels. We talked about options and decide to proceed with outpatient diagnostic peripheral vascular angiography and potential intervention for lifestyle limiting claudication.      Runell GessJonathan J. Shyla Gayheart MD FACP,FACC,FAHA, [facility] 04/22/2015 3:43 PM

## 2015-04-22 NOTE — Assessment & Plan Note (Signed)
This [name] returns today for follow-up of her Doppler studies. These were performed 03/20/15 revealed a right ABI 0.35 and a left 0.47. She has known occluded SFAs bilaterally. 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 This may be limiting inflow to her profunda vessels. We talked about options and decide to proceed with outpatient diagnostic peripheral vascular angiography and potential intervention for lifestyle limiting claudication.

## 2015-04-28 DIAGNOSIS — J45998 Other asthma: Secondary | ICD-10-CM | POA: Diagnosis not present

## 2015-04-30 ENCOUNTER — Other Ambulatory Visit: Payer: Self-pay | Admitting: *Deleted

## 2015-04-30 ENCOUNTER — Ambulatory Visit
Admission: RE | Admit: 2015-04-30 | Discharge: 2015-04-30 | Disposition: A | Discharge status: Home / Self Care | Payer: Medicare Other | Source: Ambulatory Visit | Attending: Cardiovascular Disease | Admitting: Cardiovascular Disease

## 2015-04-30 DIAGNOSIS — I10 Essential (primary) hypertension: Secondary | ICD-10-CM | POA: Diagnosis not present

## 2015-04-30 DIAGNOSIS — R072 Precordial pain: Secondary | ICD-10-CM | POA: Diagnosis not present

## 2015-04-30 DIAGNOSIS — I739 Peripheral vascular disease, unspecified: Secondary | ICD-10-CM | POA: Diagnosis not present

## 2015-04-30 DIAGNOSIS — E785 Hyperlipidemia, unspecified: Secondary | ICD-10-CM | POA: Diagnosis not present

## 2015-04-30 DIAGNOSIS — E786 Lipoprotein deficiency: Secondary | ICD-10-CM | POA: Diagnosis not present

## 2015-04-30 DIAGNOSIS — M79609 Pain in unspecified limb: Secondary | ICD-10-CM | POA: Diagnosis not present

## 2015-05-01 LAB — CBC WITH DIFFERENTIAL/PLATELET
BASOS ABS: 0.1 10*3/uL (ref 0.0–0.1)
BASOS PCT: 1 % (ref 0–1)
EOS ABS: 0.3 10*3/uL (ref 0.0–0.7)
EOS PCT: 3 % (ref 0–5)
HEMATOCRIT: 42.3 % (ref 36.0–46.0)
Hemoglobin: 13.6 g/dL (ref 12.0–15.0)
LYMPHS PCT: 17 % (ref 12–46)
Lymphs Abs: 1.4 10*3/uL (ref 0.7–4.0)
MCH: 25.2 pg — ABNORMAL LOW (ref 26.0–34.0)
MCHC: 32.2 g/dL (ref 30.0–36.0)
MCV: 78.3 fL (ref 78.0–100.0)
MONO ABS: 0.5 10*3/uL (ref 0.1–1.0)
MPV: 10 fL (ref 8.6–12.4)
Monocytes Relative: 6 % (ref 3–12)
Neutro Abs: 6.1 10*3/uL (ref 1.7–7.7)
Neutrophils Relative %: 73 % (ref 43–77)
PLATELETS: 379 10*3/uL (ref 150–400)
RBC: 5.4 MIL/uL — AB (ref 3.87–5.11)
RDW: 14.5 % (ref 11.5–15.5)
WBC: 8.4 10*3/uL (ref 4.0–10.5)

## 2015-05-01 LAB — BASIC METABOLIC PANEL
BUN: 13 mg/dL (ref 7–25)
CALCIUM: 9.2 mg/dL (ref 8.6–10.4)
CHLORIDE: 103 mmol/L (ref 98–110)
CO2: 29 mmol/L (ref 20–31)
Creat: 0.71 mg/dL (ref 0.50–0.99)
Glucose, Bld: 213 mg/dL — ABNORMAL HIGH (ref 65–99)
POTASSIUM: 3.8 mmol/L (ref 3.5–5.3)
Sodium: 139 mmol/L (ref 135–146)

## 2015-05-01 LAB — PROTIME-INR
INR: 1 (ref <1.50)
Prothrombin Time: 13.3 seconds (ref 11.6–15.2)

## 2015-05-01 LAB — TSH: TSH: 1.39 mIU/L

## 2015-05-01 LAB — APTT: aPTT: 33 seconds (ref 24–37)

## 2015-05-04 ENCOUNTER — Other Ambulatory Visit: Payer: Self-pay

## 2015-05-04 DIAGNOSIS — I739 Peripheral vascular disease, unspecified: Secondary | ICD-10-CM

## 2015-05-07 ENCOUNTER — Encounter (HOSPITAL_COMMUNITY)
Admission: RE | Disposition: A | Discharge status: Home / Self Care | Payer: Self-pay | Source: Ambulatory Visit | Attending: Cardiovascular Disease

## 2015-05-07 ENCOUNTER — Ambulatory Visit (HOSPITAL_COMMUNITY)
Admission: RE | Admit: 2015-05-07 | Discharge: 2015-05-08 | Disposition: A | Discharge status: Home / Self Care | Payer: Medicare Other | Source: Ambulatory Visit | Attending: Cardiovascular Disease | Admitting: Cardiovascular Disease

## 2015-05-07 ENCOUNTER — Encounter (HOSPITAL_COMMUNITY): Payer: Self-pay | Admitting: General Practice

## 2015-05-07 DIAGNOSIS — Z6839 Body mass index (BMI) 39.0-39.9, adult: Secondary | ICD-10-CM | POA: Insufficient documentation

## 2015-05-07 DIAGNOSIS — E663 Overweight: Secondary | ICD-10-CM | POA: Diagnosis not present

## 2015-05-07 DIAGNOSIS — I701 Atherosclerosis of renal artery: Secondary | ICD-10-CM | POA: Diagnosis not present

## 2015-05-07 DIAGNOSIS — F1290 Cannabis use, unspecified, uncomplicated: Secondary | ICD-10-CM | POA: Insufficient documentation

## 2015-05-07 DIAGNOSIS — I10 Essential (primary) hypertension: Secondary | ICD-10-CM | POA: Diagnosis not present

## 2015-05-07 DIAGNOSIS — J441 Chronic obstructive pulmonary disease with (acute) exacerbation: Secondary | ICD-10-CM | POA: Insufficient documentation

## 2015-05-07 DIAGNOSIS — R072 Precordial pain: Secondary | ICD-10-CM | POA: Insufficient documentation

## 2015-05-07 DIAGNOSIS — E1151 Type 2 diabetes mellitus with diabetic peripheral angiopathy without gangrene: Secondary | ICD-10-CM | POA: Insufficient documentation

## 2015-05-07 DIAGNOSIS — F17210 Nicotine dependence, cigarettes, uncomplicated: Secondary | ICD-10-CM | POA: Diagnosis not present

## 2015-05-07 DIAGNOSIS — E785 Hyperlipidemia, unspecified: Secondary | ICD-10-CM | POA: Insufficient documentation

## 2015-05-07 DIAGNOSIS — Z882 Allergy status to sulfonamides status: Secondary | ICD-10-CM | POA: Insufficient documentation

## 2015-05-07 DIAGNOSIS — I739 Peripheral vascular disease, unspecified: Secondary | ICD-10-CM | POA: Diagnosis present

## 2015-05-07 DIAGNOSIS — I70211 Atherosclerosis of native arteries of extremities with intermittent claudication, right leg: Secondary | ICD-10-CM | POA: Diagnosis not present

## 2015-05-07 DIAGNOSIS — I70213 Atherosclerosis of native arteries of extremities with intermittent claudication, bilateral legs: Secondary | ICD-10-CM | POA: Diagnosis not present

## 2015-05-07 DIAGNOSIS — R079 Chest pain, unspecified: Secondary | ICD-10-CM | POA: Diagnosis present

## 2015-05-07 HISTORY — PX: ILIAC VEIN ANGIOPLASTY / STENTING: SHX1788

## 2015-05-07 HISTORY — PX: PERIPHERAL VASCULAR CATHETERIZATION: SHX172C

## 2015-05-07 LAB — POCT ACTIVATED CLOTTING TIME
ACTIVATED CLOTTING TIME: 250 s
ACTIVATED CLOTTING TIME: 229 s
ACTIVATED CLOTTING TIME: 152 s

## 2015-05-07 LAB — GLUCOSE, CAPILLARY
GLUCOSE-CAPILLARY: 127 mg/dL — AB (ref 65–99)
GLUCOSE-CAPILLARY: 87 mg/dL (ref 65–99)
Glucose-Capillary: 72 mg/dL (ref 65–99)
Glucose-Capillary: 45 mg/dL — ABNORMAL LOW (ref 65–99)
Glucose-Capillary: 167 mg/dL — ABNORMAL HIGH (ref 65–99)
Glucose-Capillary: 79 mg/dL (ref 65–99)

## 2015-05-07 SURGERY — ABDOMINAL AORTOGRAM
Laterality: Right

## 2015-05-07 MED ORDER — CLOPIDOGREL BISULFATE 300 MG PO TABS
ORAL_TABLET | ORAL | Status: AC
  Filled 2015-05-07: qty 1

## 2015-05-07 MED ORDER — HEPARIN (PORCINE) IN NACL 2-0.9 UNIT/ML-% IJ SOLN
INTRAMUSCULAR | Status: AC
  Filled 2015-05-07: qty 500

## 2015-05-07 MED ORDER — NITROGLYCERIN 1 MG/10 ML FOR IR/CATH LAB
INTRA_ARTERIAL | Status: DC | PRN
  Administered 2015-05-07: 200 ug

## 2015-05-07 MED ORDER — CARVEDILOL 12.5 MG PO TABS
12.5000 mg | ORAL_TABLET | Freq: Two times a day (BID) | ORAL | Status: DC
  Administered 2015-05-07 – 2015-05-08 (×2): 12.5 mg via ORAL
  Filled 2015-05-07 (×2): qty 1

## 2015-05-07 MED ORDER — MIDAZOLAM HCL 2 MG/2ML IJ SOLN
INTRAMUSCULAR | Status: DC | PRN
Start: 2015-05-07 — End: 2015-05-07
  Administered 2015-05-07: 1 mg via INTRAVENOUS

## 2015-05-07 MED ORDER — LABETALOL HCL 5 MG/ML IV SOLN
INTRAVENOUS | Status: AC
  Administered 2015-05-07: 20 mg
  Filled 2015-05-07: qty 4

## 2015-05-07 MED ORDER — ASPIRIN EC 325 MG PO TBEC: 325.0000 mg | DELAYED_RELEASE_TABLET | Freq: Every day | ORAL | Status: DC

## 2015-05-07 MED ORDER — INSULIN PUMP
Freq: Three times a day (TID) | SUBCUTANEOUS | Status: DC
  Filled 2015-05-07: qty 1

## 2015-05-07 MED ORDER — INSULIN ASPART 100 UNIT/ML ~~LOC~~ SOLN: 0.0000 [IU] | Freq: Three times a day (TID) | SUBCUTANEOUS | Status: DC

## 2015-05-07 MED ORDER — HEPARIN (PORCINE) IN NACL 2-0.9 UNIT/ML-% IJ SOLN
INTRAMUSCULAR | Status: DC | PRN
  Administered 2015-05-07: 1000 mL

## 2015-05-07 MED ORDER — LEVOFLOXACIN 750 MG PO TABS
750.0000 mg | ORAL_TABLET | Freq: Every day | ORAL | Status: DC
  Administered 2015-05-07 – 2015-05-08 (×2): 750 mg via ORAL
  Filled 2015-05-07 (×3): qty 1

## 2015-05-07 MED ORDER — LABETALOL HCL 5 MG/ML IV SOLN
20.0000 mg | INTRAVENOUS | Status: DC | PRN
  Administered 2015-05-07 (×2): 20 mg via INTRAVENOUS
  Filled 2015-05-07: qty 4

## 2015-05-07 MED ORDER — HEPARIN SODIUM (PORCINE) 1000 UNIT/ML IJ SOLN
INTRAMUSCULAR | Status: AC
Start: 2015-05-07 — End: 2015-05-07
  Filled 2015-05-07: qty 1

## 2015-05-07 MED ORDER — INSULIN ASPART 100 UNIT/ML ~~LOC~~ SOLN: 0.0000 [IU] | Freq: Every day | SUBCUTANEOUS | Status: DC

## 2015-05-07 MED ORDER — AMLODIPINE BESYLATE 10 MG PO TABS
10.0000 mg | ORAL_TABLET | Freq: Every day | ORAL | Status: DC
  Administered 2015-05-07 – 2015-05-08 (×2): 10 mg via ORAL
  Filled 2015-05-07 (×2): qty 1

## 2015-05-07 MED ORDER — SODIUM CHLORIDE 0.9 % WEIGHT BASED INFUSION
3.0000 mL/kg/h | INTRAVENOUS | Status: DC
  Administered 2015-05-07: 3 mL/kg/h via INTRAVENOUS

## 2015-05-07 MED ORDER — HYDRALAZINE HCL 20 MG/ML IJ SOLN
INTRAMUSCULAR | Status: DC | PRN
  Administered 2015-05-07 (×2): 10 mg via INTRAVENOUS

## 2015-05-07 MED ORDER — LIDOCAINE HCL (PF) 1 % IJ SOLN
INTRAMUSCULAR | Status: AC
  Filled 2015-05-07: qty 30

## 2015-05-07 MED ORDER — DEXTROSE 50 % IV SOLN
INTRAVENOUS | Status: AC
  Administered 2015-05-07: 19:00:00 via INTRAMUSCULAR
  Filled 2015-05-07: qty 50

## 2015-05-07 MED ORDER — TRAMADOL HCL 50 MG PO TABS: 50.0000 mg | ORAL_TABLET | Freq: Four times a day (QID) | ORAL | Status: DC | PRN

## 2015-05-07 MED ORDER — NITROGLYCERIN IN D5W 200-5 MCG/ML-% IV SOLN
5.0000 ug/min | INTRAVENOUS | Status: DC
  Administered 2015-05-07: 18:00:00 5 ug/min via INTRAVENOUS
  Filled 2015-05-07: qty 250

## 2015-05-07 MED ORDER — HYDRALAZINE HCL 20 MG/ML IJ SOLN
10.0000 mg | INTRAMUSCULAR | Status: DC | PRN
  Administered 2015-05-07 (×2): 10 mg via INTRAVENOUS
  Filled 2015-05-07 (×2): qty 1

## 2015-05-07 MED ORDER — DEXTROSE 50 % IV SOLN
1.0000 | Freq: Once | INTRAVENOUS | Status: AC
  Administered 2015-05-07: 50 mL via INTRAVENOUS

## 2015-05-07 MED ORDER — HYDRALAZINE HCL 25 MG PO TABS
25.0000 mg | ORAL_TABLET | Freq: Three times a day (TID) | ORAL | Status: DC
  Administered 2015-05-07 – 2015-05-08 (×3): 25 mg via ORAL
  Filled 2015-05-07 (×3): qty 1

## 2015-05-07 MED ORDER — FLUTICASONE PROPIONATE 50 MCG/ACT NA SUSP
2.0000 | Freq: Every day | NASAL | Status: DC
  Administered 2015-05-08: 10:00:00 2 via NASAL
  Filled 2015-05-07: qty 16

## 2015-05-07 MED ORDER — FENTANYL CITRATE (PF) 100 MCG/2ML IJ SOLN
INTRAMUSCULAR | Status: DC | PRN
  Administered 2015-05-07: 25 ug via INTRAVENOUS

## 2015-05-07 MED ORDER — LIDOCAINE HCL (PF) 1 % IJ SOLN
INTRAMUSCULAR | Status: DC | PRN
  Administered 2015-05-07: 25 mL

## 2015-05-07 MED ORDER — ONDANSETRON HCL 4 MG/2ML IJ SOLN
4.0000 mg | Freq: Four times a day (QID) | INTRAMUSCULAR | Status: DC | PRN
  Administered 2015-05-07: 12:00:00 4 mg via INTRAVENOUS
  Filled 2015-05-07: qty 2

## 2015-05-07 MED ORDER — FENTANYL CITRATE (PF) 100 MCG/2ML IJ SOLN
INTRAMUSCULAR | Status: AC
  Filled 2015-05-07: qty 2

## 2015-05-07 MED ORDER — LOSARTAN POTASSIUM 50 MG PO TABS
100.0000 mg | ORAL_TABLET | Freq: Every day | ORAL | Status: DC
  Administered 2015-05-07 – 2015-05-08 (×2): 100 mg via ORAL
  Filled 2015-05-07 (×2): qty 2

## 2015-05-07 MED ORDER — ACETAMINOPHEN 325 MG PO TABS: 650.0000 mg | ORAL_TABLET | ORAL | Status: DC | PRN

## 2015-05-07 MED ORDER — SODIUM CHLORIDE 0.9 % WEIGHT BASED INFUSION: 1.0000 mL/kg/h | INTRAVENOUS | Status: DC

## 2015-05-07 MED ORDER — SODIUM CHLORIDE 0.9 % IV SOLN
INTRAVENOUS | Status: AC
  Administered 2015-05-07 (×2): via INTRAVENOUS

## 2015-05-07 MED ORDER — ASPIRIN 81 MG PO CHEW
CHEWABLE_TABLET | ORAL | Status: AC
  Filled 2015-05-07: qty 1

## 2015-05-07 MED ORDER — IODIXANOL 320 MG/ML IV SOLN
INTRAVENOUS | Status: DC | PRN
  Administered 2015-05-07: 200 mL via INTRAVENOUS

## 2015-05-07 MED ORDER — PANTOPRAZOLE SODIUM 40 MG PO TBEC
40.0000 mg | DELAYED_RELEASE_TABLET | Freq: Every day | ORAL | Status: DC
  Administered 2015-05-07 – 2015-05-08 (×2): 40 mg via ORAL
  Filled 2015-05-07 (×2): qty 1

## 2015-05-07 MED ORDER — MIDAZOLAM HCL 2 MG/2ML IJ SOLN
INTRAMUSCULAR | Status: AC
  Filled 2015-05-07: qty 2

## 2015-05-07 MED ORDER — ZOLPIDEM TARTRATE 5 MG PO TABS: 5.0000 mg | ORAL_TABLET | Freq: Every evening | ORAL | Status: DC | PRN

## 2015-05-07 MED ORDER — ASPIRIN 81 MG PO CHEW
81.0000 mg | CHEWABLE_TABLET | ORAL | Status: AC
Start: 2015-05-08 — End: 2015-05-07
  Administered 2015-05-07: 81 mg via ORAL

## 2015-05-07 MED ORDER — MORPHINE SULFATE (PF) 2 MG/ML IV SOLN
2.0000 mg | INTRAVENOUS | Status: DC | PRN
  Filled 2015-05-07 (×2): qty 1

## 2015-05-07 MED ORDER — SODIUM CHLORIDE 0.9% FLUSH: 3.0000 mL | INTRAVENOUS | Status: DC | PRN

## 2015-05-07 MED ORDER — ATROPINE SULFATE 0.1 MG/ML IJ SOLN
INTRAMUSCULAR | Status: AC
  Filled 2015-05-07: qty 10

## 2015-05-07 MED ORDER — CLOPIDOGREL BISULFATE 75 MG PO TABS
75.0000 mg | ORAL_TABLET | Freq: Every day | ORAL | Status: DC
  Administered 2015-05-08: 10:00:00 75 mg via ORAL
  Filled 2015-05-07: qty 1

## 2015-05-07 MED ORDER — HYDRALAZINE HCL 20 MG/ML IJ SOLN
INTRAMUSCULAR | Status: AC
  Filled 2015-05-07: qty 1

## 2015-05-07 MED ORDER — CLOPIDOGREL BISULFATE 300 MG PO TABS
ORAL_TABLET | ORAL | Status: DC | PRN
  Administered 2015-05-07: 300 mg via ORAL

## 2015-05-07 MED ORDER — HEPARIN SODIUM (PORCINE) 1000 UNIT/ML IJ SOLN
INTRAMUSCULAR | Status: DC | PRN
  Administered 2015-05-07: 7000 [IU] via INTRAVENOUS

## 2015-05-07 MED ORDER — ALPRAZOLAM 0.25 MG PO TABS
0.2500 mg | ORAL_TABLET | Freq: Two times a day (BID) | ORAL | Status: DC | PRN
  Administered 2015-05-07: 16:00:00 0.25 mg via ORAL
  Filled 2015-05-07: qty 1

## 2015-05-07 MED ORDER — HYDRALAZINE HCL 20 MG/ML IJ SOLN
10.0000 mg | Freq: Once | INTRAMUSCULAR | Status: AC
  Administered 2015-05-07: 10 mg via INTRAVENOUS
  Filled 2015-05-07: qty 1

## 2015-05-07 MED ORDER — NITROGLYCERIN 1 MG/10 ML FOR IR/CATH LAB
INTRA_ARTERIAL | Status: AC
  Filled 2015-05-07: qty 10

## 2015-05-07 SURGICAL SUPPLY — 21 items
BALLN MUSTANG 6.0X40 75 (BALLOONS) ×4
BALLN MUSTANG 6.0X40 75CM (BALLOONS) ×1
BALLOON MUSTANG 4X20X75 (BALLOONS) ×2 IMPLANT
BALLOON MUSTANG 6.0X40 75 (BALLOONS) IMPLANT
CATH ANGIO 5F PIGTAIL 65CM (CATHETERS) ×2 IMPLANT
CATH CROSS OVER TEMPO 5F (CATHETERS) ×2 IMPLANT
CATH STRAIGHT 5FR 65CM (CATHETERS) ×2 IMPLANT
DEVICE CONTINUOUS FLUSH (MISCELLANEOUS) ×2 IMPLANT
KIT ENCORE 26 ADVANTAGE (KITS) ×2 IMPLANT
KIT PV (KITS) ×5 IMPLANT
SHEATH PINNACLE 5F 10CM (SHEATH) ×2 IMPLANT
SHEATH PINNACLE 6F 10CM (SHEATH) ×2 IMPLANT
STENT ABSOLUTE PRO 8X60X135 (Permanent Stent) ×2 IMPLANT
STOPCOCK MORSE 400PSI 3WAY (MISCELLANEOUS) ×2 IMPLANT
SYRINGE MEDRAD AVANTA MACH 7 (SYRINGE) ×2 IMPLANT
TAPE RADIOPAQUE TURBO (MISCELLANEOUS) ×2 IMPLANT
TRANSDUCER W/STOPCOCK (MISCELLANEOUS) ×7 IMPLANT
TRAY PV CATH (CUSTOM PROCEDURE TRAY) ×5 IMPLANT
TUBING CIL FLEX 10 FLL-RA (TUBING) ×2 IMPLANT
WIRE HITORQ VERSACORE ST 145CM (WIRE) ×2 IMPLANT
WIRE VERSACORE LOC 115CM (WIRE) ×2 IMPLANT

## 2015-05-07 NOTE — H&P (View-Only) (Signed)
   04/22/2015 Ruhi M Awadallah   07/31/1950  [id number]  Primary Physician Zack Hall, MD Primary Cardiologist: Kline Bulthuis J. Mehar Sagen MD FACP,FACC,FAHA, FSCAI   HPI:  Katherine Williams is a 65-year-old moderately overweight widowed African-American female mother of 6 children, grandmother of 14 grandchildren who is referred by Dr. Croitoru for peripheral vascular evaluation. Her primary care doctor is Dr. Zack Hall in [place]. I last saw her in the office 03/17/15.Risk factors include possibly 100-pack-year history of tobacco abuse currently smoking 5 cigarettes a day and trying to quit. She has a history of hypertension, hyperlipidemia and diabetes as well as COPD. She has never had a heart attack or stroke. She does have peripheral vascular disease status post stenting remotely and right femoropopliteal bypass graft. She does complain of lifestyle limiting claudication right greater than left at less than 100 feet. She was recently hospitalized several weeks ago for shortness of breath and COPD flare. Enzymes are mildly elevated. A Myoview stress test was low risk and 2-D echo showed normal LV function. She had Dopplers done 03/20/15 revealed a right ABI 0.35 and a left 0.47. 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   Current Outpatient Prescriptions  Medication Sig Dispense Refill  . amLODipine (NORVASC) 10 MG tablet Take 1 tablet (10 mg total) by mouth daily. 30 tablet 1  . carvedilol (COREG) 12.5 MG tablet Take 1 tablet (12.5 mg total) by mouth 2 (two) times daily with a meal. 60 tablet 1  . CINNAMON PO Take 1 tablet by mouth daily.    . fluticasone (FLONASE) 50 MCG/ACT nasal spray Place 2 sprays into both nostrils daily. 16 g 2  . GARLIC PO Take 1 tablet by mouth daily.    . glipiZIDE (GLUCOTROL XL) 10 MG 24 hr tablet Take 10 mg by mouth daily with breakfast.    . hydrALAZINE  (APRESOLINE) 25 MG tablet Take 1 tablet (25 mg total) by mouth every 8 (eight) hours. 90 tablet 1  . levofloxacin (LEVAQUIN) 750 MG tablet Take 1 tablet (750 mg total) by mouth daily. 3 tablet 0  . losartan (COZAAR) 100 MG tablet Take 1 tablet (100 mg total) by mouth daily. 30 tablet 1  . Multiple Vitamin (MULTIVITAMIN WITH MINERALS) TABS tablet Take 1 tablet by mouth daily.    . pantoprazole (PROTONIX) 40 MG tablet Take 1 tablet (40 mg total) by mouth daily. 30 tablet 1  . Red Yeast Rice Extract (RED YEAST RICE PO) Take 1 tablet by mouth daily.     No current facility-administered medications for this visit.    Allergies  Allergen Reactions  . Lisinopril-Hydrochlorothiazide Hives and Itching  . Sulfonamide Derivatives Other (See Comments)    Hives    Social History   Social History  . Marital Status: Widowed    Spouse Name: N/A  . Number of Children: N/A  . Years of Education: N/A   Occupational History  . Not on file.   Social History Main Topics  . Smoking status: Current Every Day Smoker -- 1.00 packs/day for 45 years    Types: Cigarettes  . Smokeless tobacco: Never Used  . Alcohol Use: Yes     Comment: occasionally  . Drug Use: Yes    Special: Marijuana  . Sexual Activity: Not on file   Other Topics Concern  . Not on file   Social History Narrative       Review of Systems: General: negative for chills, fever, night sweats or weight changes.  Cardiovascular: negative for chest pain, dyspnea on exertion, edema, orthopnea, palpitations, paroxysmal nocturnal dyspnea or shortness of breath Dermatological: negative for rash Respiratory: negative for cough or wheezing Urologic: negative for hematuria Abdominal: negative for nausea, vomiting, diarrhea, bright red blood per rectum, melena, or hematemesis Neurologic: negative for visual changes, syncope, or dizziness All other systems reviewed and are otherwise negative except as noted above.    Blood pressure 140/82,  pulse 72, height 5' 4" (1.626 m), weight 230 lb (104.327 kg).  General appearance: alert and no distress Neck: no adenopathy, no carotid bruit, no JVD, supple, symmetrical, trachea midline and thyroid not enlarged, symmetric, no tenderness/mass/nodules Lungs: clear to auscultation bilaterally Heart: regular rate and rhythm, S1, S2 normal, no murmur, click, rub or gallop Extremities: extremities normal, atraumatic, no cyanosis or edema  EKG not performed today  ASSESSMENT AND PLAN:   PERIPHERAL VASCULAR DISEASE This Karren returns today for follow-up of her Doppler studies. These were performed 03/20/15 revealed a right ABI 0.35 and a left 0.47. She has known occluded SFAs bilaterally. 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 This may be limiting inflow to her profunda vessels. We talked about options and decide to proceed with outpatient diagnostic peripheral vascular angiography and potential intervention for lifestyle limiting claudication.      Sharise Lippy J. Nasire Reali MD FACP,FACC,FAHA, FSCAI 04/22/2015 3:43 PM  

## 2015-05-07 NOTE — Progress Notes (Addendum)
Patient arrived to room 6C03 from cath lab at 1000.  HTNsive.  Medicated for high BP with a target of SBP < 150 for sheath pull.  Medicated throughout the day with Hydralazine PO and IV, Labetolol IV, NTG infusion, and standing PO meds.  Orders received from [place], [place] and Sharon[place]ittany Strader, [place].  Unable to achieve target as of 1900.  Night RN will continue the effort.

## 2015-05-07 NOTE — Interval H&P Note (Signed)
History and Physical Interval Note:  05/07/2015 7:41 AM  [name] M Sockwell  has presented today for surgery, with the diagnosis of claudication  The various methods of treatment have been discussed with the patient and family. After consideration of risks, benefits and other options for treatment, the patient has consented to  Procedure(s): Abdominal Aortogram w/Lower Extremity (N/A) as a surgical intervention .  The patient's history has been reviewed, patient examined, no change in status, stable for surgery.  I have reviewed the patient's chart and labs.  Questions were answered to the patient's satisfaction.     Nanetta BattyBerry, Kacin Dancy

## 2015-05-07 NOTE — Progress Notes (Signed)
Spoke with patient regarding diabetes and home regimen for diabetes management.  Patient states that she is followed by Dr. Dwana MelenaZack Hall (PCP) for diabetes management. Patient has been on an insulin pump for about 13 years and uses a Medtronic insulin pump with Novolog insulin as an outpatient. Patient reports that she makes changes to her insulin pump settings herself.  Patient has insulin pump attached and clipped to outside of her gown on right upper chest. Patient will be using her insulin pump for glycemic control as an inpatient. Patient states that she does not have any extra insulin pump supplies at the bedside with her at that time. She will need to get her daughter to bring extra insulin pump supplies here to the hospital so she has extra supplies if needed.  Current insulin pump settings are as follows:  Basal insulin  12A 1.55 units/hour 9A 3.50 units/hour 6P 3.65 units/hour 10P 3.45 units/hour Total daily basal insulin: 66.95 units/24 hours  Carb Coverage 1:10 1 unit for every 10 grams of carbohydrates  Insulin Sensitivity 12A 1:55 1 unit drops blood glucose 55 mg/dl 14:78G12:30A 9.561.50 1 unit drops blood glucose 50 mg/dl 1A 2:131:55 1 unit drops blood glucose 55 mg/dl  Target Glucose Goals 08M12A 110-126 mg/dl 57:8412:30 [id number] mg/dl  In talking with the patient she states that her blood glucose normally runs very good and her last A1C was 7.0%. Patient does report that she does not check her glucose every day but when she does check her glucose it runs well.  Patient enters carbs she eats into her insulin pump with meals. However she states that sometimes she does not have an appetite and some days she does not eat at all. Discussed importance of eating regularly. Inquired about an Endocrinologist and patient states that she has thought about seeing an Endocrinologist but she just has not ever looked into finding one. Encouraged patient to talk with her PCP and to check into finding a local  Endocrinologist. Patient [place] and she would like to find an Endocrinologist in this area. Patient verbalized understanding of information discussed and states that she does not have any further questions related to diabetes at this time.  NURSING: Please print off the Patient insulin pump contract and flow sheet (already done). The insulin pump contract should be signed by the patient and then placed in the chart. The patient insulin pump flow sheet will be completed by the patient at the bedside and the RN caring for the patient will use the patient's flow sheet to document [facility]. RN will need to complete the Nursing Insulin Pump Flowsheet at least once a shift. Patient will need to keep extra insulin pump supplies at the bedside at all times.   Thanks, Orlando PennerMarie Claudine Stallings, RN, MSN, CDE Diabetes Coordinator Inpatient Diabetes Program [phone] (Team Pager from 8am to 5pm) [phone] (AP office) [phone] [facility] office) [phone] [facility] office)

## 2015-05-07 NOTE — Progress Notes (Signed)
CBG: 45  Treatment: D50 IV 50 mL  Symptoms: Hungry and Nervous/irritable  Follow-up CBG: Time:1900 CBG Result:167  Possible Reasons for Event: Inadequate meal intake  Comments/MD notified:  Given D 50 per protocol.

## 2015-05-08 ENCOUNTER — Other Ambulatory Visit: Payer: Self-pay | Admitting: Student

## 2015-05-08 ENCOUNTER — Encounter (HOSPITAL_COMMUNITY): Payer: Self-pay | Admitting: Cardiovascular Disease

## 2015-05-08 DIAGNOSIS — E785 Hyperlipidemia, unspecified: Secondary | ICD-10-CM | POA: Diagnosis not present

## 2015-05-08 DIAGNOSIS — E1151 Type 2 diabetes mellitus with diabetic peripheral angiopathy without gangrene: Secondary | ICD-10-CM | POA: Diagnosis not present

## 2015-05-08 DIAGNOSIS — R072 Precordial pain: Secondary | ICD-10-CM | POA: Diagnosis not present

## 2015-05-08 DIAGNOSIS — Z882 Allergy status to sulfonamides status: Secondary | ICD-10-CM | POA: Diagnosis not present

## 2015-05-08 DIAGNOSIS — I739 Peripheral vascular disease, unspecified: Secondary | ICD-10-CM | POA: Diagnosis not present

## 2015-05-08 DIAGNOSIS — J441 Chronic obstructive pulmonary disease with (acute) exacerbation: Secondary | ICD-10-CM | POA: Diagnosis not present

## 2015-05-08 DIAGNOSIS — I701 Atherosclerosis of renal artery: Secondary | ICD-10-CM | POA: Diagnosis not present

## 2015-05-08 DIAGNOSIS — I70213 Atherosclerosis of native arteries of extremities with intermittent claudication, bilateral legs: Secondary | ICD-10-CM | POA: Diagnosis not present

## 2015-05-08 DIAGNOSIS — I10 Essential (primary) hypertension: Secondary | ICD-10-CM | POA: Diagnosis not present

## 2015-05-08 LAB — BASIC METABOLIC PANEL
ANION GAP: 10 (ref 5–15)
BUN: 12 mg/dL (ref 6–20)
CALCIUM: 8.8 mg/dL — AB (ref 8.9–10.3)
CO2: 24 mmol/L (ref 22–32)
Chloride: 105 mmol/L (ref 101–111)
Creatinine, Ser: 1.37 mg/dL — ABNORMAL HIGH (ref 0.44–1.00)
GFR, EST AFRICAN AMERICAN: 46 mL/min — AB (ref >60)
GFR, EST NON AFRICAN AMERICAN: 40 mL/min — AB (ref >60)
Glucose, Bld: 105 mg/dL — ABNORMAL HIGH (ref 65–99)
Potassium: 2.9 mmol/L — ABNORMAL LOW (ref 3.5–5.1)
SODIUM: 139 mmol/L (ref 135–145)

## 2015-05-08 LAB — CBC
HCT: 38.4 % (ref 36.0–46.0)
HEMOGLOBIN: 12.1 g/dL (ref 12.0–15.0)
MCH: 24.7 pg — ABNORMAL LOW (ref 26.0–34.0)
MCHC: 31.5 g/dL (ref 30.0–36.0)
MCV: 78.4 fL (ref 78.0–100.0)
Platelets: 333 10*3/uL (ref 150–400)
RBC: 4.9 MIL/uL (ref 3.87–5.11)
RDW: 14.5 % (ref 11.5–15.5)
WBC: 10.8 10*3/uL — ABNORMAL HIGH (ref 4.0–10.5)

## 2015-05-08 LAB — GLUCOSE, CAPILLARY
GLUCOSE-CAPILLARY: 179 mg/dL — AB (ref 65–99)
GLUCOSE-CAPILLARY: 94 mg/dL (ref 65–99)
Glucose-Capillary: 111 mg/dL — ABNORMAL HIGH (ref 65–99)

## 2015-05-08 MED ORDER — CLOPIDOGREL BISULFATE 75 MG PO TABS: 75.0000 mg | ORAL_TABLET | Freq: Every day | ORAL | Status: DC

## 2015-05-08 MED ORDER — ASPIRIN EC 81 MG PO TBEC
81.0000 mg | DELAYED_RELEASE_TABLET | Freq: Every day | ORAL | Status: DC
  Administered 2015-05-08: 10:00:00 81 mg via ORAL
  Filled 2015-05-08: qty 1

## 2015-05-08 MED ORDER — POTASSIUM CHLORIDE CRYS ER 20 MEQ PO TBCR
40.0000 meq | EXTENDED_RELEASE_TABLET | ORAL | Status: AC
  Administered 2015-05-08 (×2): 40 meq via ORAL
  Filled 2015-05-08 (×2): qty 2

## 2015-05-08 MED ORDER — ASPIRIN 81 MG PO TBEC: 81.0000 mg | DELAYED_RELEASE_TABLET | Freq: Every day | ORAL | Status: DC

## 2015-05-08 NOTE — Accreditation Note (Signed)
CRITICAL VALUE ALERT  Critical value received:  Potassium=2.9  Date of notification:  05/08/2015  Time of notification:  0555  Critical value read back:yes  Nurse who received alert:  Anselm LisMelvin kuffour  MD notified (1st page):  Virgina OrganQureshi  Time of first page:  0555  MD notified (2nd page):  Time of second page:  Responding MD:  Virgina OrganQureshi  Time MD responded: [phone]

## 2015-05-08 NOTE — Progress Notes (Signed)
Right groin femoral artery sheath was removed at 2225 and pressure held for 20 minutes to achieve hemostasis. Education was provided to patient prior to and after removal of sheath.Site is a level 0, soft and not tender to touch. Pulses were present distally. Will continue to monitor

## 2015-05-08 NOTE — Care Management Note (Signed)
Case Management Note  Patient Details  Name: Katherine Williams MRN: [id number] Date of Birth: 1951/01/12  Subjective/Objective:     abdominal aortogram, bilateral iliac angiogram, bifemoral runoff               Action/Plan: Discharge Planning: Chart reviewed. Pt can afford medications at home. No NCM needs identified.  PCP- Benita StabileHALL, JOHN Z MD Expected Discharge Date:                  Expected Discharge Plan:  Home/Self Care  In-House Referral:  NA  Discharge planning Services  NA  Post Acute Care Choice:  NA Choice offered to:  NA  DME Arranged:  N/A DME Agency:  NA  HH Arranged:  NA HH Agency:  NA  Status of Service:  Completed, signed off  Medicare Important Message Given:    Date Medicare IM Given:    Medicare IM give by:    Date Additional Medicare IM Given:    Additional Medicare Important Message give by:     If discussed at Long Length of Stay Meetings, dates discussed:    Additional Comments:  Elliot CousinShavis, Adriane Gabbert Ellen, RN 05/08/2015, 11:55 AM

## 2015-05-08 NOTE — Discharge Summary (Signed)
Discharge Summary    Patient ID: Katherine Williams,  MRN: [id number], DOB/AGE: [birth year] 65 y.o.  Admit date: 05/07/2015 Discharge date: 05/08/2015  Primary Care Provider: Dwana Melena Primary Cardiologist: Dr. Royann Shivers; PV - Dr. Allyson Sabal  Discharge Diagnoses    Principal Problem:   PERIPHERAL VASCULAR DISEASE Active Problems:   Chest pain   Claudication [facility])  History of Present Illness     Katherine Williams is a 65 y.o. with past medical history of HTN, HLD, Type 2 DM, tobacco abuse, and PVD (s/p prior fem-pop bypass) who has been followed by Dr. Allyson Sabal due to worsening claudication. She reported significant pain in her lower extremities, right worse than the left, with less than 100 feet of ambulation.  Doppler studies were performed and showed a right ABI of 0.35 and left of 0.47. Therefore peripheral vascular angiography with possible intervention was recommended. The risks and benefits of the procedure were reviewed with the patient and she agreed to proceed.   Hospital Course     Consultants: None   She presented to [facility] on 05/07/2015 for the procedure. Her abdominal aortogram showed diffuse 60% proximal left renal artery stenosis, 40% stenosis of the left external iliac artery with the left SFA occluded at its origin. In the right lower extremity, there was diffuse 60% stenosis in the right external iliac artery followed by fairly focal 70% stenosis. Successful PTA and stenting of the right external iliac artery was performed. She will continue on ASA and Plavix. The full report is included below.  The following morning, she denied any chest pain or shortness of breath. Minimal ecchymosis was noted along her right groin with no evidence of a hematoma. She had been up ambulating around her room without difficulty. Her BP was still slightly elevated overnight, but she reported being more anxious while admitted and when seen in the office. She was encouraged to check her BP at home and  in the community. She will continue her current BP medication regimen for now.  She was last examined by Dr. Myrtis Ser and deemed stable for discharge. Follow-up Doppler studies will be arranged in the office for next week and she will follow-up with Dr. Allyson Sabal on 05/27/2015.   Discharge Vitals Blood pressure 149/54, pulse 95, temperature 99.9 F (37.7 C), temperature source Oral, resp. rate 16, height  (1.626 m), weight 229 lb 0.9 oz (103.9 kg), SpO2 95 %.  Filed Weights   05/07/15 0556 05/08/15 0312  Weight: 230 lb (104.327 kg) 229 lb 0.9 oz (103.9 kg)    Labs & Radiologic Studies     CBC  Recent Labs  05/08/15 0500  WBC 10.8*  HGB 12.1  HCT 38.4  MCV 78.4  PLT 333   Basic Metabolic Panel  Recent Labs  05/08/15 0500  NA 139  K 2.9*  CL 105  CO2 24  GLUCOSE 105*  BUN 12  CREATININE 1.37*  CALCIUM 8.8*    Dg Chest 2 View: 04/30/2015  CLINICAL DATA:  Precordial pain. EXAM: CHEST  2 VIEW COMPARISON:  February 23, 2015 FINDINGS: The heart size and mediastinal contours are within normal limits. Both lungs are clear. The visualized skeletal structures are unremarkable. IMPRESSION: No active cardiopulmonary disease. Electronically Signed   By: Gerome Sam III M.D   On: 04/30/2015 15:37     Diagnostic Studies/Procedures    Abdominal Aortogram, Lower Extremity Angiography: 05/07/2015  Pre Procedure Diagnosis: peripheral arterial disease  Post Procedure Diagnosis: peripheral arterial  disease  Operators: Dr. Nanetta Batty  Procedures Performed: 1. abdominal aortogram, bilateral iliac angiogram, bifemoral runoff 2. PTA and stent right external iliac artery   PROCEDURE DESCRIPTION:   The patient was brought to the second floor [place] [facility] cath lab in the the postabsorptive state. She was premedicated with Valium 5 mg by mouth, IV Versed and fentanyl. Her right groin was prepped and shaved in usual sterile fashion. Xylocaine  1% was used for local anesthesia. A 5 French sheath was inserted into the right common femoral artery using standard Seldinger technique. 5 French pigtail catheter was placed in the mid abdominal aorta. Abdominal aortography, bilateral iliac angiography with bifemoral runoff was performed using bolus chase, digital subtraction and step table technique. Visipaque dye was used for the entirety of the case. Retrograde aortic pressure was monitored during the case.  The patient received 7000 units of heparin with ACT of 250.The 5 French sheath was exchanged over wire for a 6 [place] sheath. Patient received 300 mg by mouth Plavix A total of 150 mL of contrast was administered to the patient. Retrograde aortic pressure was monitored during the case. Visipaque dye was used.   Total sedation time: The patient received IV conscious sedation with Versed and fentanyl. She was monitored hemodynamically and progressively by myself for 53 minutes.  Angiographic Data:   1: Abdominal aortogram-ddiffuse 60% proximal left renal artery stenosis 2: Left lower extremity-there is a diffuse 40% stenosis of the left external iliac artery. The left SFA was occluded at its origin with reconstitution in the adductor canal by profunda femoris collaterals. There was three-vessel runoff 3: Right lower extremity-tthere is diffuse 60% stenosis in the right external iliac artery followed by fairly focal 70% stenosis. There was a 30 mm gradient after administration of intra-arterial nitroglycerin on pullback. The rest of it is occluded at its origin with reconstitution in the adductor canal. There was two-vessel runoff with an occluded anterior tibial.  IMPRESSION:Ms Kendrix has known occluded SFAs bilaterally from a remote angiogram as well as an occluded right femoropopliteal bypass graft. The duplex ultrasound suggested bilateral iliac disease. I cannot demonstrate this on the left although on the right there is a hemodynamically  significant lesion in the right external iliac artery which we will proceed to Intervene on.  Procedure Description:  The lesion was predilated with a 4 mm x 2 cm balloon. A 8 mm x 60 mm long Abbott nitinol absolute Pro self-expanding stent was then carefully positioned and deployed. It was postdilated with a 6 mm x 4 cm balloon resulting reduction of a 70% stenosis to 0% residual. The patient tolerated procedure well. The sheath was secured And the wire was removed. The sheath will be removed and pressure held once the ACT falls below 170. The patient will be hydrated overnight and discharged home in the morning on dual antiplatelet therapy.  Final Impression: angiography confirmed occluded SFAs bilaterally. Patient had successful PTA and stenting of the right external iliac artery for a hemodynamically significant lesion. The patient will be discharged on the morning I will obtain lower external arterial Doppler studies in our [place] office next week. I will see her back in 2-3 weeks for follow-up.    Disposition   Pt is being discharged home today in good condition.  Follow-up Plans & Appointments    Follow-up Information    Follow up with Nanetta Batty, MD On 05/27/2015.   Specialties:  Cardiology, Radiology   Why:  Cardiology Follow-Up on 05/27/2015 at  10:00 AM. The office will call you to arrange repeat Doppler studies prior to your appointment.   Contact information:   [address] [place] [id number] [phone]      Discharge Instructions    Diet - low sodium heart healthy    Complete by:  As directed      Discharge instructions    Complete by:  As directed   PLEASE REMEMBER TO BRING ALL OF YOUR MEDICATIONS TO EACH OF YOUR FOLLOW-UP OFFICE VISITS.  PLEASE ATTEND ALL SCHEDULED FOLLOW-UP APPOINTMENTS.   Activity: Increase activity slowly as tolerated. You may shower, but no soaking baths (or swimming) for 1 week. No driving for 24 hours. No lifting  over 5 lbs for 1 week. No sexual activity for 1 week.   You May Return to Work: in 1 week (if applicable)  Wound Care: You may wash cath site gently with soap and water. Keep cath site clean and dry. If you notice pain, swelling, bleeding or pus at your cath site, please call [phone].     Increase activity slowly    Complete by:  As directed            Discharge Medications   Current Discharge Medication List    START taking these medications   Details  aspirin EC 81 MG EC tablet Take 1 tablet (81 mg total) by mouth daily.    clopidogrel (PLAVIX) 75 MG tablet Take 1 tablet (75 mg total) by mouth daily with breakfast. Qty: 30 tablet, Refills: 6      CONTINUE these medications which have NOT CHANGED   Details  amLODipine (NORVASC) 10 MG tablet Take 1 tablet (10 mg total) by mouth daily. Qty: 30 tablet, Refills: 1    carvedilol (COREG) 12.5 MG tablet Take 1 tablet (12.5 mg total) by mouth 2 (two) times daily with a meal. Qty: 60 tablet, Refills: 1    fluticasone (FLONASE) 50 MCG/ACT nasal spray Place 2 sprays into both nostrils daily. Qty: 16 g, Refills: 2    glipiZIDE (GLUCOTROL XL) 10 MG 24 hr tablet Take 10 mg by mouth daily with breakfast.    hydrALAZINE (APRESOLINE) 25 MG tablet Take 1 tablet (25 mg total) by mouth every 8 (eight) hours. Qty: 90 tablet, Refills: 1    levofloxacin (LEVAQUIN) 750 MG tablet Take 1 tablet (750 mg total) by mouth daily. Qty: 3 tablet, Refills: 0    losartan (COZAAR) 100 MG tablet Take 1 tablet (100 mg total) by mouth daily. Qty: 30 tablet, Refills: 1    pantoprazole (PROTONIX) 40 MG tablet Take 1 tablet (40 mg total) by mouth daily. Qty: 30 tablet, Refills: 1    CINNAMON PO Take 1 tablet by mouth daily.    GARLIC PO Take 1 tablet by mouth daily.    Multiple Vitamin (MULTIVITAMIN WITH MINERALS) TABS tablet Take 1 tablet by mouth daily.    Red Yeast Rice Extract (RED YEAST RICE PO) Take 1 tablet by mouth daily.           Allergies Allergies  Allergen Reactions  . Lisinopril-Hydrochlorothiazide Hives and Itching  . Sulfonamide Derivatives Other (See Comments)    Hives     Outstanding Labs/Studies   None  Duration of Discharge Encounter   Greater than 30 minutes including physician time.  Signed, Ellsworth LennoxBrittany M Strader, PA-C 05/08/2015, 9:39 AM Patient seen and examined. I agree with the assessment and plan as detailed above. See also my additional thoughts below.   Agree  with plans for discharge.   Willa Rough, MD, [facility] 05/08/2015 9:50 AM

## 2015-05-08 NOTE — Progress Notes (Signed)
Hospital Problem List     Principal Problem:   PERIPHERAL VASCULAR DISEASE Active Problems:   Chest pain   Claudication [facility](HCC)    Patient Profile:   Primary Cardiologist: Dr. Royann Shiversroitoru; PV - Dr. Allyson SabalBerry  65 yo Female w/ PMH of HTN, HLD, Type 2 DM, tobacco abuse, and PVD (recent Doppler studies showing right ABI of 0.35 and left of 0.47) who presented to [facility] 05/07/2015 for vascular angiography due to worsening claudication.  Subjective   Denies any chest pain or palpitations. Has been up ambulating around her room this morning without difficulty.  Inpatient Medications    . amLODipine  10 mg Oral Daily  . aspirin EC  325 mg Oral Daily  . carvedilol  12.5 mg Oral BID WC  . clopidogrel  75 mg Oral Q breakfast  . fluticasone  2 spray Each Nare Daily  . hydrALAZINE  25 mg Oral 3 times per day  . insulin aspart  0-15 Units Subcutaneous TID WC  . insulin aspart  0-5 Units Subcutaneous QHS  . insulin pump   Subcutaneous TID AC, HS, 0200  . levofloxacin  750 mg Oral Daily  . losartan  100 mg Oral Daily  . pantoprazole  40 mg Oral Daily  . potassium chloride  40 mEq Oral Q4H    Vital Signs    Filed Vitals:   05/07/15 2250 05/08/15 0009 05/08/15 0312 05/08/15 0400  BP: 164/65 144/46 180/56 176/64  Pulse:   89   Temp:   99.9 F (37.7 C)   TempSrc:   Oral   Resp: 19 22 20 15   Height:      Weight:   229 lb 0.9 oz (103.9 kg)   SpO2:   94%     Intake/Output Summary (Last 24 hours) at 05/08/15 0825 Last data filed at 05/08/15 [id number]  Gross per 24 hour  Intake 1403.63 ml  Output      0 ml  Net 1403.63 ml   Filed Weights   05/07/15 0556 05/08/15 0312  Weight: 230 lb (104.327 kg) 229 lb 0.9 oz (103.9 kg)    Physical Exam    General: Well developed, well nourished, female appearing in no acute distress. Head: Normocephalic, atraumatic.  Neck: Supple without bruits, JVD not elevated. Lungs:  Resp regular and unlabored, CTA without wheezing or rales. Heart: RRR,  S1, S2, no S3, S4, or murmur; no rub. Abdomen: Soft, non-tender, non-distended with normoactive bowel sounds. No hepatomegaly. No rebound/guarding. No obvious abdominal masses. Extremities: No clubbing, cyanosis, or edema. Distal pedal pulses are 2+ bilaterally. Right groin site with minimal ecchymosis. No evidence of a hematoma. Neuro: Alert and oriented X 3. Moves all extremities spontaneously. Psych: Normal affect.  Labs    CBC  Recent Labs  05/08/15 0500  WBC 10.8*  HGB 12.1  HCT 38.4  MCV 78.4  PLT 333   Basic Metabolic Panel  Recent Labs  05/08/15 0500  NA 139  K 2.9*  CL 105  CO2 24  GLUCOSE 105*  BUN 12  CREATININE 1.37*  CALCIUM 8.8*     Telemetry    NSR, HR in 70's - 90's.  ECG    No new tracings.   Cardiac Studies and Radiology    Dg Chest 2 View: 04/30/2015  CLINICAL DATA:  Precordial pain. EXAM: CHEST  2 VIEW COMPARISON:  February 23, 2015 FINDINGS: The heart size and mediastinal contours are within normal limits. Both lungs are clear. The visualized  skeletal structures are unremarkable. IMPRESSION: No active cardiopulmonary disease. Electronically Signed   By: Gerome Sam III M.D   On: 04/30/2015 15:37    Abdominal Aortogram, Lower Extremity Angiography: 05/07/2015  Pre Procedure Diagnosis: peripheral arterial disease  Post Procedure Diagnosis: peripheral arterial disease  Operators: Dr. Nanetta Batty  Procedures Performed: 1. abdominal aortogram, bilateral iliac angiogram, bifemoral runoff 2. PTA and stent right external iliac artery   PROCEDURE DESCRIPTION:   The patient was brought to the second floor [place] [facility] cath lab in the the postabsorptive state. She was premedicated with Valium 5 mg by mouth, IV Versed and fentanyl. Her right groin was prepped and shaved in usual sterile fashion. Xylocaine 1% was used for local anesthesia. A 5 French sheath was inserted into the right common femoral  artery using standard Seldinger technique. 5 French pigtail catheter was placed in the mid abdominal aorta. Abdominal aortography, bilateral iliac angiography with bifemoral runoff was performed using bolus chase, digital subtraction and step table technique. Visipaque dye was used for the entirety of the case. Retrograde aortic pressure was monitored during the case.  The patient received 7000 units of heparin with ACT of 250.The 5 French sheath was exchanged over wire for a 6 [place] sheath. Patient received 300 mg by mouth Plavix A total of 150 mL of contrast was administered to the patient. Retrograde aortic pressure was monitored during the case. Visipaque dye was used.   Total sedation time: The patient received IV conscious sedation with Versed and fentanyl. She was monitored hemodynamically and progressively by myself for 53 minutes.  Angiographic Data:   1: Abdominal aortogram-ddiffuse 60% proximal left renal artery stenosis 2: Left lower extremity-there is a diffuse 40% stenosis of the left external iliac artery. The left SFA was occluded at its origin with reconstitution in the adductor canal by profunda femoris collaterals. There was three-vessel runoff 3: Right lower extremity-tthere is diffuse 60% stenosis in the right external iliac artery followed by fairly focal 70% stenosis. There was a 30 mm gradient after administration of intra-arterial nitroglycerin on pullback. The rest of it is occluded at its origin with reconstitution in the adductor canal. There was two-vessel runoff with an occluded anterior tibial.  IMPRESSION:Ms Hammac has known occluded SFAs bilaterally from a remote angiogram as well as an occluded right femoropopliteal bypass graft. The duplex ultrasound suggested bilateral iliac disease. I cannot demonstrate this on the left although on the right there is a hemodynamically significant lesion in the right external iliac artery which we will proceed to Intervene  on.  Procedure Description:  The lesion was predilated with a 4 mm x 2 cm balloon. A 8 mm x 60 mm long Abbott nitinol absolute Pro self-expanding stent was then carefully positioned and deployed. It was postdilated with a 6 mm x 4 cm balloon resulting reduction of a 70% stenosis to 0% residual. The patient tolerated procedure well. The sheath was secured And the wire was removed. The sheath will be removed and pressure held once the ACT falls below 170. The patient will be hydrated overnight and discharged home in the morning on dual antiplatelet therapy.  Final Impression: angiography confirmed occluded SFAs bilaterally. Patient had successful PTA and stenting of the right external iliac artery for a hemodynamically significant lesion. The patient will be discharged on the morning I will obtain lower external arterial Doppler studies in our [place] office next week. I will see her back in 2-3 weeks for follow-up.   Assessment & Plan  1. PVD - was seen by Dr. Allyson Sabal in the outpatient setting for worsening claudication. Recent doppler studies showed right ABI of 0.35 and left of 0.47, therefore vascular angiography was recommended. - her abdominal aortogram showed diffuse 60% proximal left renal artery stenosis, 40% stenosis of the left external iliac artery with the left SFA occluded at its origin. In the right lower extremity, there was diffuse 60% stenosis in the right external iliac artery followed by fairly focal 70% stenosis. Successful PTA and stenting of the right external iliac artery.  - she will continue on ASA and Plavix.  - follow-up Doppler studies will be arranged in the office for next week and she will follow-up with Dr. Allyson Sabal on 05/27/2015.   2. Type 2 DM - continue current medication regimen.  3. HTN - BP has been elevated and difficult to control this admission. Says she is more anxious in the hospital and at office visits.  - encouraged her to check her BP at home or in  the community. - would continue Amlodipine  daily, Coreg 12.5mg  BID, Hydralazine  TID, and Losartan  daily and titrate pending repeat values she obtains.   Lorri Frederick , PA-C 8:25 AM 05/08/2015 Pager: [phone] Patient seen and examined. I agree with the assessment and plan as detailed above. See also my additional thoughts below.   The patient is stable and ready for discharge. I have made the decision for discharge. I agree with the discharge note and plans outlined above.  Willa Rough, MD, [facility] 05/08/2015 9:24 AM

## 2015-05-14 ENCOUNTER — Telehealth: Payer: Self-pay | Admitting: Cardiovascular Disease

## 2015-05-14 NOTE — Telephone Encounter (Signed)
Tried to call CyrilJenni back, disconnected twice.

## 2015-05-14 NOTE — Telephone Encounter (Signed)
[facility] is calling to confirm a diagnosis of Chronic Heart Faliure and to obtain a Ejection Fraction . Please call or fax to [phone].  Thanks

## 2015-05-15 DIAGNOSIS — J45998 Other asthma: Secondary | ICD-10-CM | POA: Diagnosis not present

## 2015-05-26 ENCOUNTER — Other Ambulatory Visit: Payer: Self-pay | Admitting: Internal Medicine

## 2015-05-27 ENCOUNTER — Ambulatory Visit: Payer: Medicare Other | Admitting: Cardiovascular Disease

## 2015-05-29 DIAGNOSIS — J45998 Other asthma: Secondary | ICD-10-CM | POA: Diagnosis not present

## 2015-06-02 ENCOUNTER — Encounter: Payer: Self-pay | Admitting: Cardiovascular Disease

## 2015-06-02 ENCOUNTER — Other Ambulatory Visit: Payer: Self-pay | Admitting: Internal Medicine

## 2015-06-02 ENCOUNTER — Telehealth: Payer: Self-pay | Admitting: Cardiovascular Disease

## 2015-06-02 ENCOUNTER — Ambulatory Visit (INDEPENDENT_AMBULATORY_CARE_PROVIDER_SITE_OTHER): Payer: Medicare Other | Admitting: Cardiovascular Disease

## 2015-06-02 VITALS — BP 148/64 | HR 64 | Ht 64.0 in | Wt 229.6 lb

## 2015-06-02 DIAGNOSIS — I739 Peripheral vascular disease, unspecified: Secondary | ICD-10-CM

## 2015-06-02 NOTE — Patient Instructions (Signed)
Medication Instructions:  Your physician recommends that you continue on your current medications as directed. Please refer to the Current Medication list given to you today.   Labwork: none  Testing/Procedures: Your physician has requested that you have a lower extremity arterial doppler- During this test, ultrasound is used to evaluate arterial blood flow in the legs. Allow approximately one hour for this exam.    Follow-Up: Follow up with Dr. Berry as needed.    Any Other Special Instructions Will Be Listed Below (If Applicable).     If you need a refill on your cardiac medications before your next appointment, please call your pharmacy.   

## 2015-06-02 NOTE — Assessment & Plan Note (Signed)
Past history of peripheral arterial disease status post remote peripheral stenting and right femoropopliteal bypass grafting which has since been demonstrated to be occluded occlusive. She has known occluded SFAs with bilateral iliac disease. By duplex ultrasound. She has lifestyle limiting claudication. Angiogram to 05/07/15 revealing 70% right external iliac artery stenosis which I stented using 8 mm x 60 mm long nitinol self-expanding stent. Her left external iliac artery was only 40% stenosed. Since the procedure, her  right lower extremity claudication has somewhat improved.

## 2015-06-02 NOTE — Progress Notes (Signed)
06/02/2015 Katherine GinsbergKathy M Sproule   01-31-1951  [id number]  Primary Physician Dwana MelenaZack Hall, MD Primary Cardiologist: Runell GessJonathan J. Leno Mathes MD Roseanne RenoFACP,FACC,FAHA, FSCAI   HPI:  Katherine Williams is a 65 year old moderately overweight widowed African-American female mother of 6 children, grandmother of [id number] grandchildren who is referred by Dr. Royann Shiversroitoru for peripheral vascular evaluation. Her primary care doctor is Dr. Dwana MelenaZack Hall in [place]. I last saw her in the office 04/22/15.Marland Kitchen.Risk factors include possibly 100-pack-year history of tobacco abuse currently smoking 5 cigarettes a day and trying to quit. She has a history of hypertension, hyperlipidemia and diabetes as well as COPD. She has never had a heart attack or stroke. She does have peripheral vascular disease status post stenting remotely and right femoropopliteal bypass graft. She does complain of lifestyle limiting claudication right greater than left at less than 100 feet. She was recently hospitalized several weeks ago for shortness of breath and COPD flare. Enzymes are mildly elevated. A Myoview stress test was low risk and 2-D echo showed normal LV function. She had Dopplers done 03/20/15 revealed a right ABI 0.35 and a left 0.47. 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 She underwent peripheral angiography by myself 05/07/15 SFAs bilaterally with 70% right external iliac stenosis that I stented and 4040% left. Since her procedure she has not noticed improvement in claudication symptoms on the right.   Current Outpatient Prescriptions  Medication Sig Dispense Refill  . albuterol (PROVENTIL) (2.5 MG/3ML) 0.083% nebulizer solution USE 1 VIAL VIA NEBULIZER EVERY 6 HOURS AS NEEDED FOR SHORTNESS OF BREATH  4  . amLODipine (NORVASC) 10 MG tablet Take 1 tablet (10 mg total) by mouth daily. 30 tablet 1  . aspirin EC 81 MG EC tablet Take 1 tablet  (81 mg total) by mouth daily.    . budesonide-formoterol (SYMBICORT) 160-4.5 MCG/ACT inhaler Inhale 2 puffs into the lungs daily.    . carvedilol (COREG) 12.5 MG tablet Take 1 tablet (12.5 mg total) by mouth 2 (two) times daily with a meal. 60 tablet 1  . clopidogrel (PLAVIX) 75 MG tablet Take 1 tablet (75 mg total) by mouth daily with breakfast. 30 tablet 6  . fluticasone (FLONASE) 50 MCG/ACT nasal spray Place 2 sprays into both nostrils daily. 16 g 2  . glipiZIDE (GLUCOTROL XL) 10 MG 24 hr tablet Take 10 mg by mouth daily with breakfast.    . hydrALAZINE (APRESOLINE) 25 MG tablet Take 1 tablet (25 mg total) by mouth every 8 (eight) hours. 90 tablet 1  . losartan (COZAAR) 100 MG tablet Take 1 tablet (100 mg total) by mouth daily. 30 tablet 1  . pantoprazole (PROTONIX) 40 MG tablet Take 1 tablet (40 mg total) by mouth daily. 30 tablet 1   No current facility-administered medications for this visit.    Allergies  Allergen Reactions  . Lisinopril-Hydrochlorothiazide Hives and Itching  . Sulfonamide Derivatives Other (See Comments)    Hives    Social History   Social History  . Marital Status: Widowed    Spouse Name: N/A  . Number of Children: N/A  . Years of Education: N/A   Occupational History  . Not on file.   Social History Main Topics  . Smoking status: Current Every Day Smoker -- 1.00 packs/day for 45 years    Types: Cigarettes  . Smokeless tobacco: Never Used  . Alcohol Use: Yes  Comment: occasionally  . Drug Use: Yes    Special: Marijuana  . Sexual Activity: Not on file   Other Topics Concern  . Not on file   Social History Narrative     Review of Systems: General: negative for chills, fever, night sweats or weight changes.  Cardiovascular: negative for chest pain, dyspnea on exertion, edema, orthopnea, palpitations, paroxysmal nocturnal dyspnea or shortness of breath Dermatological: negative for rash Respiratory: negative for cough or wheezing Urologic:  negative for hematuria Abdominal: negative for nausea, vomiting, diarrhea, bright red blood per rectum, melena, or hematemesis Neurologic: negative for visual changes, syncope, or dizziness All other systems reviewed and are otherwise negative except as noted above.    Blood pressure 148/64, pulse 64, height  (1.626 m), weight 229 lb 9.6 oz (104.146 kg), SpO2 97 %.  General appearance: alert and no distress Neck: no adenopathy, no carotid bruit, no JVD, supple, symmetrical, trachea midline and thyroid not enlarged, symmetric, no tenderness/mass/nodules Lungs: clear to auscultation bilaterally Heart: regular rate and rhythm, S1, S2 normal, no murmur, click, rub or gallop Extremities: extremities normal, atraumatic, no cyanosis or edema  EKG not performed today  ASSESSMENT AND PLAN:   PERIPHERAL VASCULAR DISEASE Past history of peripheral arterial disease status post remote peripheral stenting and right femoropopliteal bypass grafting which has since been demonstrated to be occluded occlusive. She has known occluded SFAs with bilateral iliac disease. By duplex ultrasound. She has lifestyle limiting claudication. Angiogram to 05/07/15 revealing 70% right external iliac artery stenosis which I stented using 8 mm x 60 mm long nitinol self-expanding stent. Her left external iliac artery was only 40% stenosed. Since the procedure, her  right lower extremity claudication has somewhat improved.      Runell Gess MD FACP,FACC,FAHA, [facility] 06/02/2015 3:38 PM

## 2015-06-02 NOTE — Telephone Encounter (Signed)
Katherine Williams is calling to confirm if Katherine Williams has a diagnosis of Chronic CHF and what is her Ejection Fraction and Blood Pressure and heart rate , Could you please fax this information to her at [phone]  Thanks

## 2015-06-03 ENCOUNTER — Other Ambulatory Visit: Payer: Self-pay | Admitting: Cardiovascular Disease

## 2015-06-03 DIAGNOSIS — I739 Peripheral vascular disease, unspecified: Secondary | ICD-10-CM

## 2015-06-05 ENCOUNTER — Other Ambulatory Visit: Payer: Self-pay | Admitting: Internal Medicine

## 2015-06-05 DIAGNOSIS — R921 Mammographic calcification found on diagnostic imaging of breast: Secondary | ICD-10-CM

## 2015-06-08 ENCOUNTER — Ambulatory Visit (INDEPENDENT_AMBULATORY_CARE_PROVIDER_SITE_OTHER): Payer: Medicare Other | Admitting: Cardiovascular Disease

## 2015-06-08 ENCOUNTER — Encounter: Payer: Self-pay | Admitting: Cardiovascular Disease

## 2015-06-08 ENCOUNTER — Other Ambulatory Visit: Payer: Self-pay | Admitting: Internal Medicine

## 2015-06-08 VITALS — BP 126/68 | HR 60 | Ht 64.0 in | Wt 229.0 lb

## 2015-06-08 DIAGNOSIS — I5032 Chronic diastolic (congestive) heart failure: Secondary | ICD-10-CM

## 2015-06-08 DIAGNOSIS — F17200 Nicotine dependence, unspecified, uncomplicated: Secondary | ICD-10-CM | POA: Diagnosis not present

## 2015-06-08 DIAGNOSIS — I10 Essential (primary) hypertension: Secondary | ICD-10-CM | POA: Diagnosis not present

## 2015-06-08 DIAGNOSIS — Z794 Long term (current) use of insulin: Secondary | ICD-10-CM

## 2015-06-08 DIAGNOSIS — E119 Type 2 diabetes mellitus without complications: Secondary | ICD-10-CM | POA: Diagnosis not present

## 2015-06-08 DIAGNOSIS — I739 Peripheral vascular disease, unspecified: Secondary | ICD-10-CM

## 2015-06-08 DIAGNOSIS — E785 Hyperlipidemia, unspecified: Secondary | ICD-10-CM

## 2015-06-08 NOTE — Patient Instructions (Signed)
Your physician recommends that you continue on your current medications as directed. Please refer to the Current Medication list given to you today.  Dr Croitoru recommends that you schedule a follow-up appointment in 6 months. You will receive a reminder letter in the mail two months in advance. If you don't receive a letter, please call our office to schedule the follow-up appointment.  If you need a refill on your cardiac medications before your next appointment, please call your pharmacy. 

## 2015-06-08 NOTE — Progress Notes (Signed)
Patient ID: Katherine Williams, female   DOB: April 11, 1950, 65 y.o.   MRN: [id number]     Cardiology Office Note    Date:  06/08/2015   ID:  Katherine Williams, DOB [birth year], MRN [id number]  PCP:  Dwana Melena, MD  Cardiologist:  Nanetta Batty, MD (PAD); Thurmon Fair, MD   Chief Complaint  Patient presents with  . 3 MONTHS  . Shortness of Breath  . Chest Pain  . Edema    History of Present Illness:  Katherine Williams is a 65 y.o. female with hypertension and diabetes mellitus and PAD (Bilateral superficial femoral artery occlusion, previous right femoropopliteal bypass recently found to be occluded, March 2017 right external iliac stent 860 mm nitinol self-expanding by Dr. Allyson Sabal), HTNive heart disease with an Episode of demand ischemia in the setting of asthma attack and hypertensive emergency in January 2017. A nuclear stress test at that time showed a small, moderate intensity fixed inferior defect suggestive of previous infarction and left ventricular ejection fraction documented at 43%. No ischemia was seen. An echocardiogram performed on the same admission showed normal left ventricular systolic function and Doppler evidence of elevated and diastolic left ventricular pressures.  She has been steadily weaning off cigarettes and declares that today is her first day without smoking a single one. She plans to completely stop smoking for the future.  Reports substantial improvement in her claudication in the right leg following the peripheral stent procedure. She denies angina pectoris or dyspnea. She has not had wheezing. She denies palpitations or syncope. Has not had any bleeding problems.  Past Medical History  Diagnosis Date  . Diabetes mellitus without complication (HCC)   . Hypertension   . Ganglion cyst   . Heart murmur   . Respiratory failure (HCC)   . COPD (chronic obstructive pulmonary disease) (HCC)   . CHF (congestive heart failure) (HCC)   . Shortness of breath dyspnea   . Headache     . Neuropathy in diabetes (HCC)   . Peripheral vascular disease (HCC)     a. prior stenting, right Fem-Pop bypass graft b. 04/2015: s/p PTA and stenting of R external iliac  . Insulin pump in place     Past Surgical History  Procedure Laterality Date  . Femoral-femoral bypass graft      right and left legs  . Carpal tunnel release    . Iliac vein angioplasty / stenting Right 05/07/2015  . Peripheral vascular catheterization N/A 05/07/2015    Procedure: Abdominal Aortogram;  Surgeon: Runell Gess, MD;  Location[facility] INVASIVE CV LAB;  Service: Cardiovascular;  Laterality: N/A;  . Peripheral vascular catheterization Bilateral 05/07/2015    Procedure: Lower Extremity Angiography;  Surgeon: Runell Gess, MD;  Location: [facility] INVASIVE CV LAB;  Service: Cardiovascular;  Laterality: Bilateral;  . Peripheral vascular catheterization Right 05/07/2015    Procedure: Peripheral Vascular Intervention;  Surgeon: Runell Gess, MD;  Location: [facility] INVASIVE CV LAB;  Service: Cardiovascular;  Laterality: Right;  ext iliac    Current Medications: Outpatient Prescriptions Prior to Visit  Medication Sig Dispense Refill  . albuterol (PROVENTIL) (2.5 MG/3ML) 0.083% nebulizer solution USE 1 VIAL VIA NEBULIZER EVERY 6 HOURS AS NEEDED FOR SHORTNESS OF BREATH  4  . amLODipine (NORVASC) 10 MG tablet Take 1 tablet (10 mg total) by mouth daily. 30 tablet 1  . aspirin EC 81 MG EC tablet Take 1 tablet (81 mg total) by mouth daily.    . carvedilol (COREG) 12.5 MG  tablet Take 1 tablet (12.5 mg total) by mouth 2 (two) times daily with a meal. 60 tablet 1  . clopidogrel (PLAVIX) 75 MG tablet Take 1 tablet (75 mg total) by mouth daily with breakfast. 30 tablet 6  . fluticasone (FLONASE) 50 MCG/ACT nasal spray Place 2 sprays into both nostrils daily. 16 g 2  . glipiZIDE (GLUCOTROL XL) 10 MG 24 hr tablet Take 10 mg by mouth daily with breakfast.    . hydrALAZINE (APRESOLINE) 25 MG tablet Take 1 tablet (25 mg total) by  mouth every 8 (eight) hours. 90 tablet 1  . losartan (COZAAR) 100 MG tablet Take 1 tablet (100 mg total) by mouth daily. 30 tablet 1  . pantoprazole (PROTONIX) 40 MG tablet Take 1 tablet (40 mg total) by mouth daily. 30 tablet 1  . budesonide-formoterol (SYMBICORT) 160-4.5 MCG/ACT inhaler Inhale 2 puffs into the lungs daily.     No facility-administered medications prior to visit.     Allergies:   Lisinopril-hydrochlorothiazide and Sulfonamide derivatives   Social History   Social History  . Marital Status: Widowed    Spouse Name: N/A  . Number of Children: N/A  . Years of Education: N/A   Social History Main Topics  . Smoking status: Current Every Day Smoker -- 1.00 packs/day for 45 years    Types: Cigarettes  . Smokeless tobacco: Never Used  . Alcohol Use: Yes     Comment: occasionally  . Drug Use: Yes    Special: Marijuana  . Sexual Activity: Not Asked   Other Topics Concern  . None   Social History Narrative      ROS:   Please see the history of present illness.    ROS All other systems reviewed and are negative.   PHYSICAL EXAM:   VS:  BP 126/68 mmHg  Pulse 60  Ht 5\' 4"  (1.626 m)  Wt 103.874 kg (229 lb)  BMI 39.29 kg/m2   GEN: Well nourished, well developed, in no acute distress HEENT: normal Neck: no JVD, carotid bruits, or masses Cardiac: RRR; no murmurs, rubs, or gallops,no edema  Respiratory:  clear to auscultation bilaterally, normal work of breathing GI: soft, nontender, nondistended, + BS MS: no deformity or atrophy Skin: warm and dry, no rash Neuro:  Alert and Oriented x 3, Strength and sensation are intact Psych: euthymic mood, full affect  Wt Readings from Last 3 Encounters:  06/08/15 103.874 kg (229 lb)  06/02/15 104.146 kg (229 lb 9.6 oz)  05/08/15 103.9 kg (229 lb 0.9 oz)      Studies/Labs Reviewed:   EKG:  EKG is ordered today.  The ekg ordered today demonstrates Normal sinus rhythm, left ventricular hypertrophy with prominent  repolarization abnormalities  Recent Labs: 02/23/2015: ALT 56*; B Natriuretic Peptide 258.5* 02/24/2015: Magnesium 2.7* 04/30/2015: TSH 1.39 05/08/2015: BUN 12; Creatinine, Ser 1.37*; Hemoglobin 12.1; Platelets 333; Potassium 2.9*; Sodium 139   Lipid Panel    Component Value Date/Time   CHOL 171 05/25/2006 1852   TRIG 164* 05/25/2006 1852   HDL 48 05/25/2006 1852   CHOLHDL 3.6 Ratio 05/25/2006 1852   VLDL 33 05/25/2006 1852   LDLCALC 90 05/25/2006 1852    Additional studies/ records that were reviewed today include:  Angiography and ultrasound peripheral arterial studies performed by Dr. Allyson SabalBerry    ASSESSMENT:    1. PERIPHERAL VASCULAR DISEASE   2. Essential hypertension   3. Controlled type 2 diabetes mellitus without complication, with long-term current use of insulin (HCC)  4. TOBACCO ABUSE   5. Hyperlipidemia   6. Chronic diastolic heart failure (HCC)      PLAN:  In order of problems listed above:  1. PAD - symptomatic improvement following iliac stent. She has bilateral occluded superficial femoral arteries. Smoking cessation will be a critical part of her future treatment. 2. HTN - excellent blood pressure control on current regimen 3. DM - labs including hemoglobin A1c and lipid profile to be performed at upcoming appointment next month with Dr. Dwana Melena 4. Smoking cessation -congratulated on quitting, encouraged to stay the course 5. HLP -  not on a statin, surprisingly good lipid profile in 2008. Need to reevaluate. 6. Diastolic HF - she has evidence of left ventricular hypertrophy and had Doppler signs of severe diastolic dysfunction during her recent hospitalization. In fact, it's not very clear how much of her shortness of breath was due to COPD exacerbation and how much represented heart failure. Her blood pressure is well controlled. Reviewed the importance of a low sodium diet. She denies edema. She does not appear to require diuretic therapy  chronically.    Medication Adjustments/Labs and Tests Ordered: Current medicines are reviewed at length with the patient today.  Concerns regarding medicines are outlined above.  Medication changes, Labs and Tests ordered today are listed in the Patient Instructions below. Patient Instructions  Your physician recommends that you continue on your current medications as directed. Please refer to the Current Medication list given to you today.  Dr Royann Shivers recommends that you schedule a follow-up appointment in 6 months. You will receive a reminder letter in the mail two months in advance. If you don't receive a letter, please call our office to schedule the follow-up appointment.  If you need a refill on your cardiac medications before your next appointment, please call your pharmacy.    Joie Bimler, MD  06/08/2015 6:26 PM    [facility] [address] [place], [place], [place]  [address] Phone: [phone]; Fax: [phone]

## 2015-06-10 ENCOUNTER — Telehealth: Payer: Self-pay

## 2015-06-10 NOTE — Telephone Encounter (Signed)
Received fax from [facility] RN case manager - Cyril LoosenJennifer Moore, RN, BSN, CCM. She requested the following information:  Physicians - Dr C (cardiology) and Dr Allyson SabalBerry (PV) Pulse - 60bpm (06/08/15) Blood Pressure - 126/6768mmHg (06/08/15) EF - 59% on last echo (02/25/15) Current Medications - a medication list was printed and attached  Requested information faxed back securely to Cyril LoosenJennifer Moore RN, BSN, CCM.

## 2015-06-11 ENCOUNTER — Ambulatory Visit (HOSPITAL_COMMUNITY)
Admission: RE | Admit: 2015-06-11 | Discharge: 2015-06-11 | Disposition: A | Discharge status: Home / Self Care | Payer: Medicare Other | Source: Ambulatory Visit | Attending: Cardiology | Admitting: Cardiology

## 2015-06-11 DIAGNOSIS — I509 Heart failure, unspecified: Secondary | ICD-10-CM | POA: Insufficient documentation

## 2015-06-11 DIAGNOSIS — I700 Atherosclerosis of aorta: Secondary | ICD-10-CM | POA: Insufficient documentation

## 2015-06-11 DIAGNOSIS — I708 Atherosclerosis of other arteries: Secondary | ICD-10-CM | POA: Insufficient documentation

## 2015-06-11 DIAGNOSIS — I739 Peripheral vascular disease, unspecified: Secondary | ICD-10-CM | POA: Insufficient documentation

## 2015-06-11 DIAGNOSIS — Z9641 Presence of insulin pump (external) (internal): Secondary | ICD-10-CM | POA: Diagnosis not present

## 2015-06-11 DIAGNOSIS — E1140 Type 2 diabetes mellitus with diabetic neuropathy, unspecified: Secondary | ICD-10-CM | POA: Diagnosis not present

## 2015-06-11 DIAGNOSIS — R938 Abnormal findings on diagnostic imaging of other specified body structures: Secondary | ICD-10-CM | POA: Insufficient documentation

## 2015-06-11 DIAGNOSIS — I110 Hypertensive heart disease with heart failure: Secondary | ICD-10-CM | POA: Insufficient documentation

## 2015-06-11 NOTE — Telephone Encounter (Signed)
Per MD Office Visit notes on 06/08/2015:  Pt has Chronic Diastolic Heart Failure with EF of 43% noticed on 02/26/15 Stress Test; during last Office Visit pt BP; 126/68 and HR 60  Faxed above information to: Gareth EagleJeannine Moore - [facility] Phone: [phone] Ext: [phone] Fax: [phone]

## 2015-06-12 ENCOUNTER — Telehealth: Payer: Self-pay

## 2015-06-12 ENCOUNTER — Ambulatory Visit: Payer: Self-pay | Admitting: Cardiovascular Disease

## 2015-06-12 DIAGNOSIS — I739 Peripheral vascular disease, unspecified: Secondary | ICD-10-CM

## 2015-06-12 NOTE — Telephone Encounter (Signed)
-----   Message from Runell GessJonathan J Berry, MD sent at 06/12/2015  8:58 AM EDT ----- No change from prior study. Repeat in 12 months.

## 2015-06-16 NOTE — Addendum Note (Signed)
Addended by: Evans LanceSTOVER, Safir Michalec W on: 06/16/2015 05:23 PM   Modules accepted: Orders

## 2015-06-28 DIAGNOSIS — J45998 Other asthma: Secondary | ICD-10-CM | POA: Diagnosis not present

## 2015-07-10 DIAGNOSIS — E119 Type 2 diabetes mellitus without complications: Secondary | ICD-10-CM | POA: Diagnosis not present

## 2015-07-10 DIAGNOSIS — E782 Mixed hyperlipidemia: Secondary | ICD-10-CM | POA: Diagnosis not present

## 2015-07-15 DIAGNOSIS — I10 Essential (primary) hypertension: Secondary | ICD-10-CM | POA: Diagnosis not present

## 2015-07-15 DIAGNOSIS — J449 Chronic obstructive pulmonary disease, unspecified: Secondary | ICD-10-CM | POA: Diagnosis not present

## 2015-07-15 DIAGNOSIS — E119 Type 2 diabetes mellitus without complications: Secondary | ICD-10-CM | POA: Diagnosis not present

## 2015-07-15 DIAGNOSIS — I739 Peripheral vascular disease, unspecified: Secondary | ICD-10-CM | POA: Diagnosis not present

## 2015-07-29 DIAGNOSIS — E1165 Type 2 diabetes mellitus with hyperglycemia: Secondary | ICD-10-CM | POA: Diagnosis not present

## 2015-07-29 DIAGNOSIS — J45998 Other asthma: Secondary | ICD-10-CM | POA: Diagnosis not present

## 2015-08-14 DIAGNOSIS — J45998 Other asthma: Secondary | ICD-10-CM | POA: Diagnosis not present

## 2015-08-28 DIAGNOSIS — J45998 Other asthma: Secondary | ICD-10-CM | POA: Diagnosis not present

## 2015-09-16 DIAGNOSIS — J45998 Other asthma: Secondary | ICD-10-CM | POA: Diagnosis not present

## 2015-09-28 DIAGNOSIS — J45998 Other asthma: Secondary | ICD-10-CM | POA: Diagnosis not present

## 2015-10-29 DIAGNOSIS — J45998 Other asthma: Secondary | ICD-10-CM | POA: Diagnosis not present

## 2015-11-09 DIAGNOSIS — E1165 Type 2 diabetes mellitus with hyperglycemia: Secondary | ICD-10-CM | POA: Diagnosis not present

## 2015-11-28 DIAGNOSIS — J45998 Other asthma: Secondary | ICD-10-CM | POA: Diagnosis not present

## 2016-02-21 DIAGNOSIS — E1165 Type 2 diabetes mellitus with hyperglycemia: Secondary | ICD-10-CM | POA: Diagnosis not present

## 2016-03-08 DIAGNOSIS — N390 Urinary tract infection, site not specified: Secondary | ICD-10-CM | POA: Diagnosis not present

## 2016-05-21 DIAGNOSIS — E119 Type 2 diabetes mellitus without complications: Secondary | ICD-10-CM | POA: Diagnosis not present

## 2016-05-21 DIAGNOSIS — E876 Hypokalemia: Secondary | ICD-10-CM | POA: Diagnosis not present

## 2016-05-23 DIAGNOSIS — H524 Presbyopia: Secondary | ICD-10-CM | POA: Diagnosis not present

## 2016-05-23 DIAGNOSIS — E119 Type 2 diabetes mellitus without complications: Secondary | ICD-10-CM | POA: Diagnosis not present

## 2016-05-28 DIAGNOSIS — Z0000 Encounter for general adult medical examination without abnormal findings: Secondary | ICD-10-CM | POA: Diagnosis not present

## 2016-06-02 DIAGNOSIS — E1165 Type 2 diabetes mellitus with hyperglycemia: Secondary | ICD-10-CM | POA: Diagnosis not present

## 2016-06-27 DIAGNOSIS — H6692 Otitis media, unspecified, left ear: Secondary | ICD-10-CM | POA: Diagnosis not present

## 2016-07-19 DIAGNOSIS — I10 Essential (primary) hypertension: Secondary | ICD-10-CM | POA: Diagnosis not present

## 2016-07-19 DIAGNOSIS — Z794 Long term (current) use of insulin: Secondary | ICD-10-CM | POA: Diagnosis not present

## 2016-07-19 DIAGNOSIS — R21 Rash and other nonspecific skin eruption: Secondary | ICD-10-CM | POA: Diagnosis not present

## 2016-07-19 DIAGNOSIS — E119 Type 2 diabetes mellitus without complications: Secondary | ICD-10-CM | POA: Diagnosis not present

## 2016-07-19 DIAGNOSIS — I208 Other forms of angina pectoris: Secondary | ICD-10-CM | POA: Diagnosis not present

## 2016-07-25 DIAGNOSIS — Z961 Presence of intraocular lens: Secondary | ICD-10-CM | POA: Diagnosis not present

## 2016-07-25 DIAGNOSIS — H26491 Other secondary cataract, right eye: Secondary | ICD-10-CM | POA: Diagnosis not present

## 2016-07-25 DIAGNOSIS — H26493 Other secondary cataract, bilateral: Secondary | ICD-10-CM | POA: Diagnosis not present

## 2016-07-25 DIAGNOSIS — I10 Essential (primary) hypertension: Secondary | ICD-10-CM | POA: Diagnosis not present

## 2016-07-25 DIAGNOSIS — E119 Type 2 diabetes mellitus without complications: Secondary | ICD-10-CM | POA: Diagnosis not present

## 2016-08-29 ENCOUNTER — Ambulatory Visit (INDEPENDENT_AMBULATORY_CARE_PROVIDER_SITE_OTHER): Payer: Medicare Other | Admitting: "Endocrinology

## 2016-08-29 ENCOUNTER — Encounter: Payer: Self-pay | Admitting: "Endocrinology

## 2016-08-29 VITALS — BP 149/76 | HR 66 | Ht 64.0 in | Wt 226.0 lb

## 2016-08-29 DIAGNOSIS — E782 Mixed hyperlipidemia: Secondary | ICD-10-CM

## 2016-08-29 DIAGNOSIS — E1159 Type 2 diabetes mellitus with other circulatory complications: Secondary | ICD-10-CM | POA: Diagnosis not present

## 2016-08-29 DIAGNOSIS — I10 Essential (primary) hypertension: Secondary | ICD-10-CM | POA: Diagnosis not present

## 2016-08-29 DIAGNOSIS — F17200 Nicotine dependence, unspecified, uncomplicated: Secondary | ICD-10-CM | POA: Diagnosis not present

## 2016-08-29 DIAGNOSIS — Z6838 Body mass index (BMI) 38.0-38.9, adult: Secondary | ICD-10-CM

## 2016-08-29 NOTE — Patient Instructions (Signed)

## 2016-08-29 NOTE — Progress Notes (Signed)
Subjective:    Patient ID: Katherine Williams, female    DOB: 1951-02-07. Patient is being seen in consultation for management of diabetes requested by  Alvina Filbert, MD  Past Medical History:  Diagnosis Date  . CHF (congestive heart failure) (HCC)   . COPD (chronic obstructive pulmonary disease) (HCC)   . Diabetes mellitus without complication (HCC)   . Ganglion cyst   . Headache   . Heart murmur   . Hypertension   . Insulin pump in place   . Neuropathy in diabetes (HCC)   . Peripheral vascular disease (HCC)    a. prior stenting, right Fem-Pop bypass graft b. 04/2015: s/p PTA and stenting of R external iliac  . Respiratory failure (HCC)   . Shortness of breath dyspnea    Past Surgical History:  Procedure Laterality Date  . CARPAL TUNNEL RELEASE    . FEMORAL-FEMORAL BYPASS GRAFT     right and left legs  . ILIAC VEIN ANGIOPLASTY / STENTING Right 05/07/2015  . PERIPHERAL VASCULAR CATHETERIZATION N/A 05/07/2015   Procedure: Abdominal Aortogram;  Surgeon: Runell Gess, MD;  Location: [facility] INVASIVE CV LAB;  Service: Cardiovascular;  Laterality: N/A;  . PERIPHERAL VASCULAR CATHETERIZATION Bilateral 05/07/2015   Procedure: Lower Extremity Angiography;  Surgeon: Runell Gess, MD;  Location: [facility] INVASIVE CV LAB;  Service: Cardiovascular;  Laterality: Bilateral;  . PERIPHERAL VASCULAR CATHETERIZATION Right 05/07/2015   Procedure: Peripheral Vascular Intervention;  Surgeon: Runell Gess, MD;  Location: [facility] INVASIVE CV LAB;  Service: Cardiovascular;  Laterality: Right;  ext iliac   Social History   Social History  . Marital status: Widowed    Spouse name: N/A  . Number of children: N/A  . Years of education: N/A   Social History Main Topics  . Smoking status: Current Every Day Smoker    Packs/day: 1.00    Years: 45.00    Types: Cigarettes  . Smokeless tobacco: Never Used  . Alcohol use Yes     Comment: occasionally  . Drug use: Yes    Types: Marijuana  . Sexual activity:  Not Asked   Other Topics Concern  . None   Social History Narrative  . None   Outpatient Encounter Prescriptions as of 08/29/2016  Medication Sig  . amLODipine (NORVASC) 10 MG tablet Take 1 tablet (10 mg total) by mouth daily.  Marland Kitchen aspirin EC 81 MG EC tablet Take 1 tablet (81 mg total) by mouth daily.  Marland Kitchen atorvastatin (LIPITOR) 40 MG tablet Take 40 mg by mouth daily.  . carvedilol (COREG) 12.5 MG tablet Take 1 tablet (12.5 mg total) by mouth 2 (two) times daily with a meal.  . hydrALAZINE (APRESOLINE) 25 MG tablet Take 1 tablet (25 mg total) by mouth every 8 (eight) hours.  . insulin lispro (HUMALOG) 100 UNIT/ML injection Inject into the skin. Via pump  . losartan (COZAAR) 100 MG tablet Take 1 tablet (100 mg total) by mouth daily.  . [DISCONTINUED] glipiZIDE (GLUCOTROL XL) 10 MG 24 hr tablet Take 10 mg by mouth daily with breakfast.  . [DISCONTINUED] albuterol (PROVENTIL) (2.5 MG/3ML) 0.083% nebulizer solution USE 1 VIAL VIA NEBULIZER EVERY 6 HOURS AS NEEDED FOR SHORTNESS OF BREATH  . [DISCONTINUED] clopidogrel (PLAVIX) 75 MG tablet Take 1 tablet (75 mg total) by mouth daily with breakfast.  . [DISCONTINUED] fluticasone (FLONASE) 50 MCG/ACT nasal spray Place 2 sprays into both nostrils daily.  . [DISCONTINUED] pantoprazole (PROTONIX) 40 MG tablet Take 1 tablet (40 mg total) by  mouth daily.   No facility-administered encounter medications on file as of 08/29/2016.    ALLERGIES: Allergies  Allergen Reactions  . Lisinopril-Hydrochlorothiazide Hives and Itching  . Penicillins   . Sulfonamide Derivatives Other (See Comments)    Hives   VACCINATION STATUS:  There is no immunization history on file for this patient.  Diabetes  She presents for her initial diabetic visit. She has type 2 diabetes mellitus. Onset time: She was diagnosed at approximate age of 40 years. Her disease course has been stable. Hypoglycemia symptoms include confusion, headaches and sweats. Pertinent negatives for  hypoglycemia include no pallor or seizures. There are no diabetic associated symptoms. Pertinent negatives for diabetes include no chest pain, no polydipsia, no polyphagia and no polyuria. There are no hypoglycemic complications. Symptoms are stable (She wears a Medtronic insulin pump for the last 10 years, her most recent A1c was 6.7%.). Diabetic complications include nephropathy and PVD. Risk factors for coronary artery disease include diabetes mellitus, dyslipidemia, hypertension, obesity, sedentary lifestyle and tobacco exposure. Current diabetic treatment includes insulin pump (She is also on glipizide 10 mg by mouth twice a day). Her weight is stable. She is following a generally unhealthy diet. When asked about meal planning, she reported none. She has not had a previous visit with a dietitian. She never participates in exercise. (Patient admits that she has not been monitoring blood glucose for several months even while wearing insulin pump. ) An ACE inhibitor/angiotensin II receptor blocker is being taken. She sees a podiatrist.Eye exam is current.  Hyperlipidemia  This is a chronic problem. The current episode started more than 1 year ago. The problem is uncontrolled. Exacerbating diseases include diabetes and obesity. Pertinent negatives include no chest pain, myalgias or shortness of breath. Current antihyperlipidemic treatment includes statins. Risk factors for coronary artery disease include diabetes mellitus, dyslipidemia, hypertension, obesity and a sedentary lifestyle.  Hypertension  This is a chronic problem. The current episode started more than 1 year ago. Associated symptoms include headaches and sweats. Pertinent negatives include no chest pain, palpitations or shortness of breath. Risk factors for coronary artery disease include dyslipidemia, diabetes mellitus, obesity, sedentary lifestyle and smoking/tobacco exposure. Past treatments include angiotensin blockers. Hypertensive end-organ  damage includes PVD.       Review of Systems  Constitutional: Negative for chills, fever and unexpected weight change.  HENT: Negative for trouble swallowing and voice change.   Eyes: Negative for visual disturbance.  Respiratory: Negative for cough, shortness of breath and wheezing.   Cardiovascular: Negative for chest pain, palpitations and leg swelling.  Gastrointestinal: Negative for diarrhea, nausea and vomiting.  Endocrine: Negative for cold intolerance, heat intolerance, polydipsia, polyphagia and polyuria.  Musculoskeletal: Negative for arthralgias and myalgias.  Skin: Negative for color change, pallor, rash and wound.  Neurological: Positive for headaches. Negative for seizures.  Psychiatric/Behavioral: Positive for confusion. Negative for suicidal ideas.    Objective:    BP (!) 149/76   Pulse 66   Ht 5\' 4"  (1.626 m)   Wt 226 lb (102.5 kg)   BMI 38.79 kg/m   Wt Readings from Last 3 Encounters:  08/29/16 226 lb (102.5 kg)  06/08/15 229 lb (103.9 kg)  06/02/15 229 lb 9.6 oz (104.1 kg)    Physical Exam  Constitutional: She is oriented to person, place, and time. She appears well-developed.  HENT:  Head: Normocephalic and atraumatic.  Eyes: EOM are normal.  Neck: Normal range of motion. Neck supple. No tracheal deviation present. No thyromegaly present.  Cardiovascular: Normal rate and regular rhythm.   Pulmonary/Chest: Effort normal and breath sounds normal.  Abdominal: Soft. Bowel sounds are normal. There is no tenderness. There is no guarding.  Musculoskeletal: Normal range of motion. She exhibits no edema.  Neurological: She is alert and oriented to person, place, and time. She has normal reflexes. No cranial nerve deficit. Coordination normal.  Skin: Skin is warm and dry. No rash noted. No erythema. No pallor.  Psychiatric: She has a normal mood and affect. Judgment normal.     CMP ( most recent) CMP     Component Value Date/Time   NA 139 05/08/2015 0500    K 2.9 (L) 05/08/2015 0500   CL 105 05/08/2015 0500   CO2 24 05/08/2015 0500   GLUCOSE 105 (H) 05/08/2015 0500   BUN 12 05/08/2015 0500   CREATININE 1.37 (H) 05/08/2015 0500   CREATININE 0.71 04/30/2015 1259   CALCIUM 8.8 (L) 05/08/2015 0500   PROT 7.8 02/23/2015 2135   ALBUMIN 3.7 02/23/2015 2135   AST 100 (H) 02/23/2015 2135   ALT 56 (H) 02/23/2015 2135   ALKPHOS 78 02/23/2015 2135   BILITOT 0.6 02/23/2015 2135   GFRNONAA 40 (L) 05/08/2015 0500   GFRAA 46 (L) 05/08/2015 0500     Diabetic Labs (most recent): Lab Results  Component Value Date   HGBA1C 7.1 10/09/2006   HGBA1C 7.8 05/25/2006     Lipid Panel ( most recent) Lipid Panel     Component Value Date/Time   CHOL 171 05/25/2006 1852   TRIG 164 (H) 05/25/2006 1852   HDL 48 05/25/2006 1852   CHOLHDL 3.6 Ratio 05/25/2006 1852   VLDL 33 05/25/2006 1852   LDLCALC 90 05/25/2006 1852              Assessment & Plan:   1. DM type 2 causing vascular disease (HCC)   - Patient has currently uncontrolled symptomatic type 2 DM since  66 years of age. - She wore Medtronic insulin pump for the last 10 years,  with most recent A1c of  6.7%.  - She uses Humalog via insulin pump. -Review of her pump settings showing basal insulin:  12:00AM- 9:00AM= 1.35 9:00Am-6:00 PM=3.5 6:00PM-10:00Pm=3.65 10:00Pm-12:00PM=3.45 giving her total basal insulin of 65.15 units in 24 hours. Her insulin to carb ratio is 10, insulin sensitivity factor is 55, glucose target 100 and 126, insulin active time is 3 hours.   Her diabetes is complicated by peripheral arterial disease, renal insufficiency, chronic heavy smoking and patient remains at a high risk for more acute and chronic complications which include CAD, CVA, CKD, retinopathy, and neuropathy. These are all discussed in detail with the patient.  - I have counseled the patient on diet management and weight loss, by adopting a carbohydrate restricted/protein rich diet.  -  Suggestion is made for patient to avoid simple carbohydrates   from her diet including Cakes , Desserts, Ice Cream,  Soda (  diet and regular) , Sweet Tea , Candies,  Chips, Cookies, Artificial Sweeteners,   and "Sugar-free" Products . This will help patient to have stable blood glucose profile and potentially avoid unintended weight gain.  - I encouraged the patient to switch to  unprocessed or minimally processed complex starch and increased protein intake (animal or plant source), fruits, and vegetables.  - Patient is advised to stick to a routine mealtimes to eat 3 meals  a day and avoid unnecessary snacks ( to snack only to correct hypoglycemia).  - The  patient will be scheduled with Norm Salt, RDN, CDE for individualized DM education.  - I have approached patient with the following individualized plan to manage diabetes and patient agrees:   - Tragically she has misused her insulin pump. She admits that she has not monitor blood glucose in month but wears large dose insulin floor rate as above. - I   urged her to initiate strict monitoring of blood glucose 4 times a day-before meals and at bedtime.  - I kept her insulin basal in bolus stating the same. I have changed her glucose target to between 100-150 and changed active insulin time to 4 hours. - Patient is warned not to take insulin without proper monitoring per orders. - She is advised to not to bolus if her pre-meal blood glucose readings below 90 mg/dL. -Patient is encouraged to call clinic for blood glucose levels less than 70 or above 300 mg /dl.  - I will discontinue glipizide, risk outweighs benefit for this patient. -Patient is not a candidate for metformin,SGLT2 inhibitors due to CKD. - Next visit, She will be considered for MDI therapy instead of insulin pump if she does not commit properly for strict  monitoring for safe use of insulin pump. - Patient will be considered for incretin therapy as appropriate next visit. -  Patient specific target  A1c;  LDL, HDL, Triglycerides, and  Waist Circumference were discussed in detail.  2) BP/HTN: Uncontrolled. Continue current medications including ACEI/ARB. Advised to quit smoking. 3) Lipids/HPL:   Lipid panel unknown.   Patient is advised to continue statins. 4)  Weight/Diet: CDE Consult will be initiated , exercise, and detailed carbohydrates information provided.  5) Chronic Care/Health Maintenance:  -Patient is on ACEI/ARB and Statin medications and encouraged to continue to follow up with Ophthalmology, Podiatrist at least yearly or according to recommendations, and advised to  Quit smoking. I have recommended yearly flu vaccine and pneumonia vaccination at least every 5 years; moderate intensity exercise for up to 150 minutes weekly; and  sleep for at least 7 hours a day.  - 60 minutes of time was spent on the care of this patient , 50% of which was applied for counseling on diabetes complications and their preventions.  - Patient to bring meter and  blood glucose logs during her next visit.   - I advised patient to maintain close follow up with Alvina Filbert, MD for primary care needs.  Follow up plan: - Return in about 2 weeks (around 09/12/2016) for follow up with meter and logs- no labs.  Marquis Lunch, MD Phone: [phone]  Fax: [phone]   08/29/2016, 2:34 PM

## 2016-09-19 ENCOUNTER — Ambulatory Visit: Payer: Self-pay | Admitting: "Endocrinology

## 2016-10-25 ENCOUNTER — Encounter (HOSPITAL_COMMUNITY): Payer: Self-pay

## 2016-10-25 ENCOUNTER — Emergency Department (HOSPITAL_COMMUNITY): Payer: Medicare Other

## 2016-10-25 ENCOUNTER — Other Ambulatory Visit: Payer: Self-pay

## 2016-10-25 ENCOUNTER — Emergency Department (HOSPITAL_COMMUNITY)
Admission: EM | Admit: 2016-10-25 | Discharge: 2016-10-26 | Disposition: A | Discharge status: Home / Self Care | Payer: Medicare Other | Attending: Emergency Medicine | Admitting: Emergency Medicine

## 2016-10-25 DIAGNOSIS — Z7982 Long term (current) use of aspirin: Secondary | ICD-10-CM | POA: Diagnosis not present

## 2016-10-25 DIAGNOSIS — Z794 Long term (current) use of insulin: Secondary | ICD-10-CM | POA: Diagnosis not present

## 2016-10-25 DIAGNOSIS — R0602 Shortness of breath: Secondary | ICD-10-CM

## 2016-10-25 DIAGNOSIS — J45909 Unspecified asthma, uncomplicated: Secondary | ICD-10-CM | POA: Insufficient documentation

## 2016-10-25 DIAGNOSIS — I5032 Chronic diastolic (congestive) heart failure: Secondary | ICD-10-CM | POA: Insufficient documentation

## 2016-10-25 DIAGNOSIS — I110 Hypertensive heart disease with heart failure: Secondary | ICD-10-CM | POA: Insufficient documentation

## 2016-10-25 DIAGNOSIS — Z79899 Other long term (current) drug therapy: Secondary | ICD-10-CM | POA: Diagnosis not present

## 2016-10-25 DIAGNOSIS — F17210 Nicotine dependence, cigarettes, uncomplicated: Secondary | ICD-10-CM | POA: Insufficient documentation

## 2016-10-25 DIAGNOSIS — E119 Type 2 diabetes mellitus without complications: Secondary | ICD-10-CM | POA: Diagnosis not present

## 2016-10-25 DIAGNOSIS — J441 Chronic obstructive pulmonary disease with (acute) exacerbation: Secondary | ICD-10-CM | POA: Diagnosis not present

## 2016-10-25 MED ORDER — IPRATROPIUM BROMIDE 0.02 % IN SOLN
RESPIRATORY_TRACT | Status: AC
  Filled 2016-10-25: qty 5

## 2016-10-25 MED ORDER — IPRATROPIUM BROMIDE 0.02 % IN SOLN
1.0000 mg | Freq: Once | RESPIRATORY_TRACT | Status: AC
  Administered 2016-10-25: 1 mg via RESPIRATORY_TRACT

## 2016-10-25 MED ORDER — ALBUTEROL SULFATE (2.5 MG/3ML) 0.083% IN NEBU
5.0000 mg | INHALATION_SOLUTION | Freq: Once | RESPIRATORY_TRACT | Status: AC
  Administered 2016-10-25: 5 mg via RESPIRATORY_TRACT
  Filled 2016-10-25: qty 6

## 2016-10-25 MED ORDER — ALBUTEROL (5 MG/ML) CONTINUOUS INHALATION SOLN
10.0000 mg/h | INHALATION_SOLUTION | RESPIRATORY_TRACT | Status: DC
  Administered 2016-10-25: 10 mg/h via RESPIRATORY_TRACT

## 2016-10-25 MED ORDER — PREDNISONE 50 MG PO TABS
60.0000 mg | ORAL_TABLET | Freq: Once | ORAL | Status: AC
  Administered 2016-10-26: 60 mg via ORAL
  Filled 2016-10-25: qty 1

## 2016-10-25 MED ORDER — ALBUTEROL (5 MG/ML) CONTINUOUS INHALATION SOLN
INHALATION_SOLUTION | RESPIRATORY_TRACT | Status: AC
  Filled 2016-10-25: qty 20

## 2016-10-25 NOTE — ED Provider Notes (Signed)
[facility]-EMERGENCY DEPT Provider Note   CSN: [id number] Arrival date & time: 10/25/16  2237     History   Chief Complaint Chief Complaint  Patient presents with  . Shortness of Breath    HPI Katherine Williams is a 66 y.o. female with history of diabetes, COPD, CHF, hypertension who presents with a three-day history of cough and shortness of breath. Patient was seen at her primary care provider today and given a breathing treatment without much relief. Patient has been coughing up clear sputum. Patient denies any chest pain, fever, abdominal pain, nausea, vomiting, urinary symptoms. She denies any new leg pain or swelling.  HPI  Past Medical History:  Diagnosis Date  . CHF (congestive heart failure) (HCC)   . COPD (chronic obstructive pulmonary disease) (HCC)   . Diabetes mellitus without complication (HCC)   . Ganglion cyst   . Headache   . Heart murmur   . Hypertension   . Insulin pump in place   . Neuropathy in diabetes (HCC)   . Peripheral vascular disease (HCC)    a. prior stenting, right Fem-Pop bypass graft b. 04/2015: s/p PTA and stenting of R external iliac  . Respiratory failure (HCC)   . Shortness of breath dyspnea     Patient Active Problem List   Diagnosis Date Noted  . Class 2 severe obesity due to excess calories with serious comorbidity and body mass index (BMI) of 38.0 to 38.9 in adult (HCC) 08/29/2016  . Chronic diastolic heart failure (HCC) 06/08/2015  . Claudication (HCC) 05/07/2015  . Pain in the chest   . Chest pain   . DM type 2 causing vascular disease (HCC)   . Hypertensive urgency 02/24/2015  . Hypokalemia 02/24/2015  . Elevated troponin 02/24/2015  . Asthma exacerbation 02/23/2015  . COPD exacerbation (HCC) 02/23/2015  . LEG PAIN, BILATERAL 05/25/2006  . CHEST PAIN, ATYPICAL, HX OF 02/23/2006  . Mixed hyperlipidemia 12/21/2005  . Current smoker 12/21/2005  . SYNDROME, RESTLESS LEGS 12/21/2005  . SYNDROME, CARPAL TUNNEL 12/21/2005  . Essential  hypertension 12/21/2005  . PERIPHERAL VASCULAR DISEASE 12/21/2005  . GERD 12/21/2005  . OSTEOARTHRITIS 12/21/2005  . HIP PAIN, CHRONIC 12/21/2005    Past Surgical History:  Procedure Laterality Date  . CARPAL TUNNEL RELEASE    . FEMORAL-FEMORAL BYPASS GRAFT     right and left legs  . ILIAC VEIN ANGIOPLASTY / STENTING Right 05/07/2015  . PERIPHERAL VASCULAR CATHETERIZATION N/A 05/07/2015   Procedure: Abdominal Aortogram;  Surgeon: Runell Gess, MD;  Location: [facility] INVASIVE CV LAB;  Service: Cardiovascular;  Laterality: N/A;  . PERIPHERAL VASCULAR CATHETERIZATION Bilateral 05/07/2015   Procedure: Lower Extremity Angiography;  Surgeon: Runell Gess, MD;  Location: [facility] INVASIVE CV LAB;  Service: Cardiovascular;  Laterality: Bilateral;  . PERIPHERAL VASCULAR CATHETERIZATION Right 05/07/2015   Procedure: Peripheral Vascular Intervention;  Surgeon: Runell Gess, MD;  Location: [facility] INVASIVE CV LAB;  Service: Cardiovascular;  Laterality: Right;  ext iliac    OB History    No data available       Home Medications    Prior to Admission medications   Medication Sig Start Date End Date Taking? Authorizing Provider  amLODipine (NORVASC) 10 MG tablet Take 1 tablet (10 mg total) by mouth daily. 02/26/15  Yes Vassie Loll, MD  aspirin EC 81 MG EC tablet Take 1 tablet (81 mg total) by mouth daily. 05/08/15  Yes Strader, [place] M, PA-C  carvedilol (COREG) 12.5 MG tablet Take 1 tablet (12.5  mg total) by mouth 2 (two) times daily with a meal. 02/26/15  Yes Vassie LollMadera, Carlos, MD  glipiZIDE (GLUCOTROL XL) 10 MG 24 hr tablet Take 10 mg by mouth daily with breakfast.   Yes [provider]  hydrALAZINE (APRESOLINE) 25 MG tablet Take 1 tablet (25 mg total) by mouth every 8 (eight) hours. 02/26/15  Yes Vassie LollMadera, Carlos, MD  ipratropium-albuterol (DUONEB) 0.5-2.5 (3) MG/3ML SOLN Inhale 3 mLs into the lungs every 6 (six) hours as needed (for shortness of breath).  10/25/16  Yes [provider]   losartan (COZAAR) 100 MG tablet Take 1 tablet (100 mg total) by mouth daily. 02/26/15  Yes Vassie LollMadera, Carlos, MD  azithromycin (ZITHROMAX) 250 MG tablet Take 1 tablet (250 mg total) by mouth daily. Take first 2 tablets together, then 1 every day until finished. 10/26/16   Estera Ozier, Waylan BogaAlexandra M, PA-C  insulin lispro (HUMALOG) 100 UNIT/ML injection Inject into the skin. Via pump    [provider]  predniSONE (DELTASONE) 20 MG tablet Take 3 tablets (60 mg total) by mouth daily. 10/26/16   Emi HolesLaw, Juno Bozard M, PA-C    Family History No family history on file.  Social History Social History  Substance Use Topics  . Smoking status: Current Every Day Smoker    Packs/day: 1.00    Years: 45.00    Types: Cigarettes  . Smokeless tobacco: Never Used  . Alcohol use Yes     Comment: occasionally     Allergies   Lisinopril-hydrochlorothiazide; Penicillins; and Sulfonamide derivatives   Review of Systems Review of Systems  Constitutional: Negative for chills and fever.  HENT: Negative for facial swelling and sore throat.   Respiratory: Positive for cough, shortness of breath and wheezing.   Cardiovascular: Negative for chest pain.  Gastrointestinal: Negative for abdominal pain, nausea and vomiting.  Genitourinary: Negative for dysuria.  Musculoskeletal: Negative for back pain.  Skin: Negative for rash and wound.  Neurological: Negative for headaches.  Psychiatric/Behavioral: The patient is not nervous/anxious.      Physical Exam Updated Vital Signs BP (!) 178/57   Pulse 80   Temp 98.8 F (37.1 C) (Oral)   Resp 16   Ht 5\' 4"  (1.626 m)   Wt 98 kg (216 lb)   SpO2 100%   BMI 37.08 kg/m   Physical Exam  Constitutional: She appears well-developed and well-nourished. No distress.  HENT:  Head: Normocephalic and atraumatic.  Mouth/Throat: Oropharynx is clear and moist. No oropharyngeal exudate.  Eyes: Pupils are equal, round, and reactive to light. Conjunctivae are normal. Right eye  exhibits no discharge. Left eye exhibits no discharge. No scleral icterus.  Neck: Normal range of motion. Neck supple. No thyromegaly present.  Cardiovascular: Normal rate, regular rhythm, normal heart sounds and intact distal pulses.  Exam reveals no gallop and no friction rub.   No murmur heard. Pulmonary/Chest: Effort normal and breath sounds normal. No stridor. No respiratory distress. She has no wheezes. She has no rales.  Very little air movement, expiratory wheezing is bilaterally  Abdominal: Soft. Bowel sounds are normal. She exhibits no distension. There is no tenderness. There is no rebound and no guarding.  Musculoskeletal: She exhibits no edema.  Lymphadenopathy:    She has no cervical adenopathy.  Neurological: She is alert. Coordination normal.  Skin: Skin is warm and dry. No rash noted. She is not diaphoretic. No pallor.  Psychiatric: She has a normal mood and affect.  Nursing note and vitals reviewed.    ED Treatments /  Results  Labs (all labs ordered are listed, but only abnormal results are displayed) Labs Reviewed  BASIC METABOLIC PANEL - Abnormal; Notable for the following:       Result Value   Sodium 134 (*)    Chloride 98 (*)    Glucose, Bld 273 (*)    Calcium 8.8 (*)    All other components within normal limits  CBC WITH DIFFERENTIAL/PLATELET - Abnormal; Notable for the following:    HCT 35.8 (*)    MCV 77.5 (*)    All other components within normal limits  BRAIN NATRIURETIC PEPTIDE    EKG  EKG Interpretation  Date/Time:  Tuesday October 25 2016 23:11:38 EDT Ventricular Rate:  85 PR Interval:    QRS Duration: 89 QT Interval:  343 QTC Calculation: 408 R Axis:   66 Text Interpretation:  Sinus rhythm Probable LVH with secondary repol abnrm Baseline wander in lead(s) I III aVL V5 V6 No STEMI. Similar to prior.  Confirmed by Alona Bene [phone]) on 10/25/2016 11:17:11 PM       Radiology Dg Chest 2 View  Result Date: 10/25/2016 CLINICAL DATA:   Dyspnea and cough x2 days. Current smoker times 45 years at 1 pack per day. EXAM: CHEST  2 VIEW COMPARISON:  04/30/2015 CXR FINDINGS: The heart size and mediastinal contours are within normal limits. Atherosclerotic calcifications of the aortic arch without aneurysm. Mild increase in interstitial prominence suspicious for mild acute bronchitic change. No pneumonic consolidation or CHF. The visualized skeletal structures are unremarkable. IMPRESSION: Aortic atherosclerosis. Mild interstitial prominence consistent with acute bronchitic change. Electronically Signed   By: Tollie Eth M.D.   On: 10/25/2016 23:20    Procedures Procedures (including critical care time)  Medications Ordered in ED Medications  albuterol (PROVENTIL,VENTOLIN) solution continuous neb (0 mg/hr Nebulization Stopped 10/26/16 0040)  albuterol (PROVENTIL, VENTOLIN) (5 MG/ML) 0.5% continuous inhalation solution (  Not Given 10/25/16 2339)  ipratropium (ATROVENT) 0.02 % nebulizer solution (  Not Given 10/25/16 2339)  albuterol (PROVENTIL HFA;VENTOLIN HFA) 108 (90 Base) MCG/ACT inhaler 2 puff (not administered)  albuterol (PROVENTIL) (2.5 MG/3ML) 0.083% nebulizer solution 5 mg (5 mg Nebulization Given 10/25/16 2315)  ipratropium (ATROVENT) nebulizer solution 1 mg (1 mg Nebulization Given 10/25/16 2331)  predniSONE (DELTASONE) tablet 60 mg (60 mg Oral Given 10/26/16 0000)     Initial Impression / Assessment and Plan / ED Course  I have reviewed the triage vital signs and the nursing notes.  Pertinent labs & imaging results that were available during my care of the patient were reviewed by me and considered in my medical decision making (see chart for details).     On my initial evaluation, patient having significant expiratory wheezing and shortness of breath and coughing following albuterol nebulizer. Will order continuous DuoNeb.  12:15a on reevaluation after continuous DuoNeb, patient states she is feeling much better. Wheezes  have resolved. Air movement much improved. We'll observe for rebound.  CBC, BMP unremarkable. BNP 94. With the exception of glucose 273. CXR shows mild interstitial prominence consistent with acute bronchitic change. We'll discharge home with prednisone burst, azithromycin, and albuterol inhaler. Patient ambulated with pulse ox prior to discharge with 98-96% on RA with only mild shortness of breath upon return. Patient reporting she is feeling much better and ready to go home. Will have patient follow-up to PCP. Patient vitals stable throughout ED course and discharged in satisfactory condition. Patient also evaluated by Dr. Bebe Shaggy who guided the patient's management and agrees with plan.  Final Clinical Impressions(s) / ED Diagnoses   Final diagnoses:  COPD exacerbation (HCC)  SOB (shortness of breath)    New Prescriptions New Prescriptions   AZITHROMYCIN (ZITHROMAX) 250 MG TABLET    Take 1 tablet (250 mg total) by mouth daily. Take first 2 tablets together, then 1 every day until finished.   PREDNISONE (DELTASONE) 20 MG TABLET    Take 3 tablets (60 mg total) by mouth daily.     Emi Holes, PA-C 10/26/16 4098    Zadie Rhine, MD 10/26/16 0201

## 2016-10-25 NOTE — ED Triage Notes (Signed)
Reports of shortness of breath since Sunday. States she went to PCP today and had breathing treatment, states no better.

## 2016-10-26 LAB — CBC WITH DIFFERENTIAL/PLATELET
Basophils Absolute: 0 10*3/uL (ref 0.0–0.1)
Basophils Relative: 0 %
Eosinophils Absolute: 0.3 10*3/uL (ref 0.0–0.7)
Eosinophils Relative: 4 %
HCT: 35.8 % — ABNORMAL LOW (ref 36.0–46.0)
Hemoglobin: 12 g/dL (ref 12.0–15.0)
Lymphocytes Relative: 17 %
Lymphs Abs: 1.3 10*3/uL (ref 0.7–4.0)
MCH: 26 pg (ref 26.0–34.0)
MCHC: 33.5 g/dL (ref 30.0–36.0)
MCV: 77.5 fL — ABNORMAL LOW (ref 78.0–100.0)
Monocytes Absolute: 0.8 10*3/uL (ref 0.1–1.0)
Monocytes Relative: 11 %
Neutro Abs: 5.1 10*3/uL (ref 1.7–7.7)
Neutrophils Relative %: 68 %
Platelets: 232 10*3/uL (ref 150–400)
RBC: 4.62 MIL/uL (ref 3.87–5.11)
RDW: 13.6 % (ref 11.5–15.5)
WBC: 7.5 10*3/uL (ref 4.0–10.5)

## 2016-10-26 LAB — BASIC METABOLIC PANEL
Anion gap: 8 (ref 5–15)
BUN: 9 mg/dL (ref 6–20)
CO2: 28 mmol/L (ref 22–32)
Calcium: 8.8 mg/dL — ABNORMAL LOW (ref 8.9–10.3)
Chloride: 98 mmol/L — ABNORMAL LOW (ref 101–111)
Creatinine, Ser: 0.77 mg/dL (ref 0.44–1.00)
GFR calc Af Amer: >60 mL/min (ref >60)
GFR calc non Af Amer: >60 mL/min (ref >60)
Glucose, Bld: 273 mg/dL — ABNORMAL HIGH (ref 65–99)
Potassium: 3.5 mmol/L (ref 3.5–5.1)
Sodium: 134 mmol/L — ABNORMAL LOW (ref 135–145)

## 2016-10-26 LAB — BRAIN NATRIURETIC PEPTIDE: B Natriuretic Peptide: 94 pg/mL (ref 0.0–100.0)

## 2016-10-26 MED ORDER — ALBUTEROL SULFATE HFA 108 (90 BASE) MCG/ACT IN AERS
2.0000 | INHALATION_SPRAY | Freq: Once | RESPIRATORY_TRACT | Status: AC
  Administered 2016-10-26: 2 via RESPIRATORY_TRACT
  Filled 2016-10-26: qty 6.7

## 2016-10-26 MED ORDER — AZITHROMYCIN 250 MG PO TABS: 250.0000 mg | ORAL_TABLET | Freq: Every day | ORAL | 0 refills | Status: DC

## 2016-10-26 MED ORDER — PREDNISONE 20 MG PO TABS: 60.0000 mg | ORAL_TABLET | Freq: Every day | ORAL | 0 refills | Status: DC

## 2016-10-26 NOTE — Discharge Instructions (Signed)
Medications: Prednisone, azithromycin, albuterol inhaler  Treatment: Take prednisone as prescribed for the next 4 days. Take azithromycin as prescribed. Use 1-2 puffs of albuterol inhaler every 4-6 hours as needed for cough, shortness of breath, or wheezing.  Follow-up: Please follow-up with your primary care provider for further evaluation and treatment and the need to be on any daily medications for your COPD. Please return to the emergency department if you develop any new or worsening symptoms.

## 2016-11-03 ENCOUNTER — Encounter: Payer: Medicare Other | Attending: Internal Medicine | Admitting: Nutrition

## 2016-11-03 VITALS — Ht 64.0 in | Wt 222.0 lb

## 2016-11-03 DIAGNOSIS — E669 Obesity, unspecified: Secondary | ICD-10-CM

## 2016-11-03 DIAGNOSIS — E118 Type 2 diabetes mellitus with unspecified complications: Secondary | ICD-10-CM

## 2016-11-03 DIAGNOSIS — Z713 Dietary counseling and surveillance: Secondary | ICD-10-CM | POA: Insufficient documentation

## 2016-11-03 DIAGNOSIS — E1159 Type 2 diabetes mellitus with other circulatory complications: Secondary | ICD-10-CM | POA: Insufficient documentation

## 2016-11-03 DIAGNOSIS — Z794 Long term (current) use of insulin: Secondary | ICD-10-CM

## 2016-11-03 DIAGNOSIS — E1165 Type 2 diabetes mellitus with hyperglycemia: Secondary | ICD-10-CM

## 2016-11-03 DIAGNOSIS — IMO0002 Reserved for concepts with insufficient information to code with codable children: Secondary | ICD-10-CM

## 2016-11-03 NOTE — Patient Instructions (Signed)
Goals Follow My Plate 2. Eat 2-3 carb choices per meal 3. Increase fresh fruits and vegetables Don't skip meals Eat breakfast by 9 ish am Don't eat past 7 pm. No snacks.

## 2016-11-03 NOTE — Progress Notes (Signed)
  Medical Nutrition Therapy:  Appt start time: 1100 end time:  1200.   Assessment:  Primary concerns today: Diabetes Type 2,  Had insulin pump but doesn't have any income for her insulin and supplies anymore. Switched to 70/30 10 units BID. She notes she knows how to control her blood sugars but doesn't have access to insulin due to lack of income? Is on Medicare. Sees Dr. Fransico Him, [facility]. Had recent episode of chest pain and went to ER.  Testing blood sugars three times per day ? BS was 128 mg/dl this am since she got her insulin. Not working and doesn't have any income. Current diet is inconsistent to meet her nutritional needs and to control her diabetes. DIet is inconsistent in meals, fresh fruits, vegetables, whole grains. A1C  6.7% ?   Preferred Learning Style:  Viisual  Learning Readiness:   Contemplating  Ready  Change in progress   MEDICATIONS:    DIETARY INTAKE:     24-hr recall:  B ( AM): 1130am Sausage, coffee Snk ( AM): water, , diet soda  Snk ( PM): D ( PM):  Pork chop, collard greens and corn, water Snk ( PM): Beverages: water  Usual physical activity: ADL  Estimated energy needs: 1500  calories 170  g carbohydrates 112 g protein 42 g fat  Progress Towards Goal(s):  Some progress.   Nutritional Diagnosis:  NB-1.1 Food and nutrition-related knowledge deficit As related to Obesity and DM Type 2.  As evidenced by A1C 6.7% and BMI  38.    Intervention:  Nutrition and Diabetes education provided on My Plate, CHO counting, meal planning, portion sizes, timing of meals, avoiding snacks between meals unless having a low blood sugar, target ranges for A1C and blood sugars, signs/symptoms and treatment of hyper/hypoglycemia, monitoring blood sugars, taking medications as prescribed, benefits of exercising 30 minutes per day and prevention of complications of DM. Marland Kitchen Goals Follow My Plate 2. Eat 2-3 carb choices per meal 3. Increase fresh  fruits and vegetables Don't skip meals Eat breakfast by 9 ish am Don't eat past 7 pm. No snacks.   Teaching Method Utilized:  Visual Auditory Hands on  Handouts given during visit include:  The Plate Method  Meal Plan Card  Diabetes Instructions.   Barriers to learning/adherence to lifestyle change: No job, no income  Demonstrated degree of understanding via:  Teach Back   Monitoring/Evaluation:  Dietary intake, exercise, meal planning, SBG, and body weight in 1 month(s).

## 2016-11-30 ENCOUNTER — Encounter: Payer: Medicare Other | Attending: Internal Medicine | Admitting: Nutrition

## 2016-11-30 ENCOUNTER — Ambulatory Visit (INDEPENDENT_AMBULATORY_CARE_PROVIDER_SITE_OTHER): Payer: Medicare Other | Admitting: Cardiology

## 2016-11-30 ENCOUNTER — Encounter: Payer: Self-pay | Admitting: Cardiology

## 2016-11-30 VITALS — BP 144/70 | HR 71 | Ht 64.0 in | Wt 223.8 lb

## 2016-11-30 VITALS — Ht 64.0 in | Wt 223.0 lb

## 2016-11-30 DIAGNOSIS — M79604 Pain in right leg: Secondary | ICD-10-CM

## 2016-11-30 DIAGNOSIS — E669 Obesity, unspecified: Secondary | ICD-10-CM

## 2016-11-30 DIAGNOSIS — R002 Palpitations: Secondary | ICD-10-CM | POA: Diagnosis not present

## 2016-11-30 DIAGNOSIS — M79605 Pain in left leg: Secondary | ICD-10-CM

## 2016-11-30 DIAGNOSIS — R0989 Other specified symptoms and signs involving the circulatory and respiratory systems: Secondary | ICD-10-CM

## 2016-11-30 DIAGNOSIS — E1165 Type 2 diabetes mellitus with hyperglycemia: Secondary | ICD-10-CM

## 2016-11-30 DIAGNOSIS — E1159 Type 2 diabetes mellitus with other circulatory complications: Secondary | ICD-10-CM | POA: Diagnosis not present

## 2016-11-30 DIAGNOSIS — IMO0002 Reserved for concepts with insufficient information to code with codable children: Secondary | ICD-10-CM

## 2016-11-30 DIAGNOSIS — Z713 Dietary counseling and surveillance: Secondary | ICD-10-CM | POA: Diagnosis not present

## 2016-11-30 DIAGNOSIS — K219 Gastro-esophageal reflux disease without esophagitis: Secondary | ICD-10-CM

## 2016-11-30 DIAGNOSIS — E118 Type 2 diabetes mellitus with unspecified complications: Secondary | ICD-10-CM

## 2016-11-30 DIAGNOSIS — R079 Chest pain, unspecified: Secondary | ICD-10-CM

## 2016-11-30 MED ORDER — CARVEDILOL 12.5 MG PO TABS: 18.7500 mg | ORAL_TABLET | Freq: Two times a day (BID) | ORAL | 3 refills | Status: DC

## 2016-11-30 NOTE — Patient Instructions (Signed)
Medication Instructions:  INCREASE COREG TO 18.75 MG TWO TIMES DAILY ( 1 1/2 TABLETS- TWO TIMES DAILY)   Labwork: NONE  Testing/Procedures: Your physician has recommended that you wear an event monitor. Event monitors are medical devices that record the heart's electrical activity. Doctors most often us these monitors to diagnose arrhythmias. Arrhythmias are problems with the speed or rhythm of the heartbeat. The monitor is a small, portable device. You can wear one while you do your normal daily activities. This is usually used to diagnose what is causing palpitations/syncope (passing out).  Your physician has requested that you have a carotid duplex. This test is an ultrasound of the carotid arteries in your neck. It looks at blood flow through these arteries that supply the brain with blood. Allow one hour for this exam. There are no restrictions or special instructions.  Your physician has requested that you have an ankle brachial index (ABI). During this test an ultrasound and blood pressure cuff are used to evaluate the arteries that supply the arms and legs with blood. Allow thirty minutes for this exam. There are no restrictions or special instructions.  AORTOILIAC ULTRASOUND   Follow-Up: Your physician recommends that you schedule a follow-up appointment in: 2 MONTHS    Any Other Special Instructions Will Be Listed Below (If Applicable).     If you need a refill on your cardiac medications before your next appointment, please call your pharmacy.

## 2016-11-30 NOTE — Progress Notes (Signed)
Clinical Summary Katherine Williams is a 66 y.o.female last seen by Dr Royann Shivers, this is our first visit together  1. Chest pain - Jan 2017 nuclear stress with small inferior infarct, no ischemia, LVEF 43%.  - Jan 2017 echo LVEF "normal", no LVEF% given.   - nonspecific pain midchest/epigastric, 1/10 in severity. Mainly occurs at rest. No other associated symptoms. Can last about 1 hour. Often comes on after eating. History of ulcers and gastritis in 2007, had been on NSAIDs at that time.     2. Palpitations - occurs 2-3 times a week, lasts just a few minutes - limited caffeine, no EtOH.   3. HTN - does not check at home - compliant with meds - limiting sodium intake.   3. DM2 - followed by pcp  4. PAD - Bilateral superficial femoral artery occlusion, previous right femoropopliteal bypass recently found to be occluded, March 2017 right external iliac stent 860 mm nitinol self-expanding by Dr. Allyson Sabal - 05/2015 ABI right 0.4, left 0.5. Aortioiliac US showed stable >50% right common iliac, >50% right external iliac, and >50% left external iliac artery stenosis - no recent claudication  5. Chronic diastolic HF - has had some LE edema at times.  - sulfa allergy, not on diuretic   Past Medical History:  Diagnosis Date  . CHF (congestive heart failure) (HCC)   . COPD (chronic obstructive pulmonary disease) (HCC)   . Diabetes mellitus without complication (HCC)   . Ganglion cyst   . Headache   . Heart murmur   . Hypertension   . Insulin pump in place   . Neuropathy in diabetes (HCC)   . Peripheral vascular disease (HCC)    a. prior stenting, right Fem-Pop bypass graft b. 04/2015: s/p PTA and stenting of R external iliac  . Respiratory failure (HCC)   . Shortness of breath dyspnea      Allergies  Allergen Reactions  . Lisinopril-Hydrochlorothiazide Hives and Itching  . Penicillins   . Sulfonamide Derivatives Other (See Comments)    Hives     Current Outpatient  Prescriptions  Medication Sig Dispense Refill  . amLODipine (NORVASC) 10 MG tablet Take 1 tablet (10 mg total) by mouth daily. 30 tablet 1  . aspirin EC 81 MG EC tablet Take 1 tablet (81 mg total) by mouth daily.    Marland Kitchen azithromycin (ZITHROMAX) 250 MG tablet Take 1 tablet (250 mg total) by mouth daily. Take first 2 tablets together, then 1 every day until finished. (Patient not taking: Reported on 11/03/2016) 6 tablet 0  . carvedilol (COREG) 12.5 MG tablet Take 1 tablet (12.5 mg total) by mouth 2 (two) times daily with a meal. 60 tablet 1  . glipiZIDE (GLUCOTROL XL) 10 MG 24 hr tablet Take 10 mg by mouth daily with breakfast.    . hydrALAZINE (APRESOLINE) 25 MG tablet Take 1 tablet (25 mg total) by mouth every 8 (eight) hours. 90 tablet 1  . insulin aspart protamine- aspart (NOVOLOG MIX 70/30) (70-30) 100 UNIT/ML injection Inject 10 Units into the skin 2 (two) times daily with a meal.    . insulin lispro (HUMALOG) 100 UNIT/ML injection Inject into the skin. Via pump    . ipratropium-albuterol (DUONEB) 0.5-2.5 (3) MG/3ML SOLN Inhale 3 mLs into the lungs every 6 (six) hours as needed (for shortness of breath).     . losartan (COZAAR) 100 MG tablet Take 1 tablet (100 mg total) by mouth daily. 30 tablet 1  . predniSONE (DELTASONE)  20 MG tablet Take 3 tablets (60 mg total) by mouth daily. 12 tablet 0   No current facility-administered medications for this visit.      Past Surgical History:  Procedure Laterality Date  . CARPAL TUNNEL RELEASE    . FEMORAL-FEMORAL BYPASS GRAFT     right and left legs  . ILIAC VEIN ANGIOPLASTY / STENTING Right 05/07/2015  . PERIPHERAL VASCULAR CATHETERIZATION N/A 05/07/2015   Procedure: Abdominal Aortogram;  Surgeon: Runell GessJonathan J Berry, MD;  [facility];  Service: Cardiovascular;  Laterality: N/A;  . PERIPHERAL VASCULAR CATHETERIZATION Bilateral 05/07/2015   Procedure: Lower Extremity Angiography;  Surgeon: Runell GessJonathan J Berry, MD;  [facility];  Service: Cardiovascular;  Laterality: Bilateral;  . PERIPHERAL VASCULAR CATHETERIZATION Right 05/07/2015   Procedure: Peripheral Vascular Intervention;  Surgeon: Runell GessJonathan J Berry, MD;  [facility];  Service: Cardiovascular;  Laterality: Right;  ext iliac     Allergies  Allergen Reactions  . Lisinopril-Hydrochlorothiazide Hives and Itching  . Penicillins   . Sulfonamide Derivatives Other (See Comments)    Hives      No family history on file.   Social History Katherine Williams reports that she has been smoking Cigarettes.  She has a 45.00 pack-year smoking history. She has never used smokeless tobacco. Katherine Williams reports that she drinks alcohol.   Review of Systems CONSTITUTIONAL: No weight loss, fever, chills, weakness or fatigue.  HEENT: Eyes: No visual loss, blurred vision, double vision or yellow sclerae.No hearing loss, sneezing, congestion, runny nose or sore throat.  SKIN: No rash or itching.  CARDIOVASCULAR: per hpi RESPIRATORY: No shortness of breath, cough or sputum.  GASTROINTESTINAL: No anorexia, nausea, vomiting or diarrhea. No abdominal pain or blood.  GENITOURINARY: No burning on urination, no polyuria NEUROLOGICAL: No headache, dizziness, syncope, paralysis, ataxia, numbness or tingling in the extremities. No change in bowel or bladder control.  MUSCULOSKELETAL: No muscle, back pain, joint pain or stiffness.  LYMPHATICS: No enlarged nodes. No history of splenectomy.  PSYCHIATRIC: No history of depression or anxiety.  ENDOCRINOLOGIC: No reports of sweating, cold or heat intolerance. No polyuria or polydipsia.  Marland Kitchen.   Physical Examination Vitals:   11/30/16 1447  BP: (!) 144/70  Pulse: 71  SpO2: 94%   Vitals:   11/30/16 1447  Weight: 223 lb 12.8 oz (101.5 kg)  Height: 5\' 4"  (1.626 m)    Gen: resting comfortably, no acute distress HEENT: no scleral icterus, pupils equal round and reactive, no palptable cervical adenopathy,  CV: rrr, no mrg.  +bilateral carotid bruits Resp: Clear to auscultation bilaterally GI: abdomen is soft, non-tender, non-distended, normal bowel sounds, no hepatosplenomegaly MSK: extremities are warm, no edema.  Skin: warm, no rash Neuro:  no focal deficits Psych: appropriate affect   Diagnostic Studies Jan 2017 nuclear stress  There was no ST segment deviation noted during stress.  No T wave inversion was noted during stress.  Nuclear stress EF: 43%.  This is a low risk study.  The left ventricular ejection fraction is moderately decreased (30-44%).   Low risk stress nuclear study with a small, moderate intensity, fixed inferior defect consistent with prior infarct; no ischemia; EF 43 but visually appears better; suggest echo to further assess; mild LVE; inferior hypokinesis.    Assessment and Plan   1. Chest pain - recent symtoms more suggestive of GI etiology, we will refer to GI. She has complex history of prior ulcers and gastritis  2. Palpitatoins -  we will obtain a 7 day event monitor - increase beta blocker  3. HTN - above goal, we will increase coreg to 18.75mg  bid  4. PAD - repeat aortoiliac [place] and ABI with history of prior interventions.   5. Carotid bruit  order carotid US   F/u 2 months  Antoine Poche, M.D.

## 2016-11-30 NOTE — Patient Instructions (Addendum)
Goals 1. Don't skip meals 2. Increase, Vegetables. 3. Cut out diet sodas 4. Exercise 15 minutes daily. 5. Prevent low blood sugars. 6. Continue to reduce smoking.
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
## 2016-12-02 ENCOUNTER — Other Ambulatory Visit: Payer: Self-pay | Admitting: Cardiology

## 2016-12-02 DIAGNOSIS — R079 Chest pain, unspecified: Secondary | ICD-10-CM

## 2016-12-02 DIAGNOSIS — I739 Peripheral vascular disease, unspecified: Secondary | ICD-10-CM

## 2016-12-02 DIAGNOSIS — I771 Stricture of artery: Secondary | ICD-10-CM

## 2016-12-05 ENCOUNTER — Ambulatory Visit (INDEPENDENT_AMBULATORY_CARE_PROVIDER_SITE_OTHER): Payer: Medicare Other

## 2016-12-05 DIAGNOSIS — R002 Palpitations: Secondary | ICD-10-CM

## 2016-12-06 ENCOUNTER — Ambulatory Visit (HOSPITAL_COMMUNITY)
Admission: RE | Admit: 2016-12-06 | Discharge: 2016-12-06 | Disposition: A | Discharge status: Home / Self Care | Payer: Medicare Other | Source: Ambulatory Visit | Attending: Cardiology | Admitting: Cardiology

## 2016-12-06 DIAGNOSIS — M79605 Pain in left leg: Secondary | ICD-10-CM | POA: Diagnosis present

## 2016-12-06 DIAGNOSIS — M79604 Pain in right leg: Secondary | ICD-10-CM | POA: Insufficient documentation

## 2016-12-06 DIAGNOSIS — R0989 Other specified symptoms and signs involving the circulatory and respiratory systems: Secondary | ICD-10-CM | POA: Diagnosis present

## 2016-12-06 DIAGNOSIS — I739 Peripheral vascular disease, unspecified: Secondary | ICD-10-CM | POA: Diagnosis not present

## 2016-12-06 DIAGNOSIS — K760 Fatty (change of) liver, not elsewhere classified: Secondary | ICD-10-CM | POA: Insufficient documentation

## 2016-12-06 DIAGNOSIS — R079 Chest pain, unspecified: Secondary | ICD-10-CM

## 2016-12-06 DIAGNOSIS — I771 Stricture of artery: Secondary | ICD-10-CM | POA: Diagnosis not present

## 2016-12-06 DIAGNOSIS — I6523 Occlusion and stenosis of bilateral carotid arteries: Secondary | ICD-10-CM | POA: Insufficient documentation

## 2016-12-08 ENCOUNTER — Encounter (INDEPENDENT_AMBULATORY_CARE_PROVIDER_SITE_OTHER): Payer: Self-pay | Admitting: Internal Medicine

## 2016-12-21 ENCOUNTER — Telehealth: Payer: Self-pay

## 2016-12-21 NOTE — Telephone Encounter (Signed)
Called pt., no answer. Left message for pt to return call.  

## 2016-12-21 NOTE — Telephone Encounter (Signed)
Pt. returned call. Made pt. aware of monitor results. She voiced understanding. She stated that she still has palpitations daily, but she has for the last 10 years. She did ask if medication would benefit her. I will forward to Dr. Wyline MoodBranch to advise.

## 2016-12-21 NOTE — Telephone Encounter (Signed)
-----   Message from Antoine PocheJonathan F Branch, MD sent at 12/21/2016  2:12 PM EST ----- Heart monitor overall looks good, just some occasional extra heart beats that are considered benign but can cause some symptoms. How are her palpitations doing?  Katherine FerryJ Branch MD

## 2016-12-22 NOTE — Telephone Encounter (Signed)
INcrease coreg to 25mg  bid to see if helps further with symptoms   Dominga FerryJ Kylan Liberati MD

## 2016-12-23 MED ORDER — CARVEDILOL 25 MG PO TABS: 25.0000 mg | ORAL_TABLET | Freq: Two times a day (BID) | ORAL | 3 refills | Status: AC

## 2016-12-23 NOTE — Telephone Encounter (Signed)
Called pt to advise her on medication increase. Sent in new RX to pharmacy.

## 2016-12-23 NOTE — Addendum Note (Signed)
Addended by: Abelino DerrickMCGHEE, Sandara Tyree R on: 12/23/2016 09:51 AM   Modules accepted: Orders

## 2016-12-26 ENCOUNTER — Ambulatory Visit (INDEPENDENT_AMBULATORY_CARE_PROVIDER_SITE_OTHER): Payer: Medicare Other | Admitting: Internal Medicine

## 2016-12-26 ENCOUNTER — Encounter (INDEPENDENT_AMBULATORY_CARE_PROVIDER_SITE_OTHER): Payer: Self-pay | Admitting: Internal Medicine

## 2016-12-26 VITALS — BP 110/60 | HR 64 | Temp 98.1°F | Ht 64.0 in | Wt 221.1 lb

## 2016-12-26 DIAGNOSIS — K219 Gastro-esophageal reflux disease without esophagitis: Secondary | ICD-10-CM | POA: Diagnosis not present

## 2016-12-26 DIAGNOSIS — Z860100 Personal history of colon polyps, unspecified: Secondary | ICD-10-CM

## 2016-12-26 MED ORDER — PANTOPRAZOLE SODIUM 40 MG PO TBEC: 40.0000 mg | DELAYED_RELEASE_TABLET | Freq: Two times a day (BID) | ORAL | 5 refills | Status: DC

## 2016-12-26 NOTE — Patient Instructions (Addendum)

## 2016-12-26 NOTE — Progress Notes (Signed)
Subjective:    Patient ID: Katherine Williams, female    DOB: [birth year], 66 y.o.   MRN: [id number]  HPI Referred by Dr. Alvina Filbertenise Hunter for GERD. C/o after eating, she says sometimes she will have epigastric pain. Feels like boiling water.  She was started on the Protonix for 3-4  Her appetite is not good. She has not lost any weight.  She says she avoids beef. She has no problems with spaghetti, except the sauce burns her stomach.  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 Followed by Dr. Fausto SkillernBefakadu.  No BC powders.   Hx of diabetic x 20 yrs.  She has declined a colonoscopy today.   Her last colonoscopy was in 2007 by Dr. Leone PayorGessner.  Cecal poyp removed. Otherwise normal colonoscopy.   CECUM, POLYP(S): ADENOMATOUS POLYP(S). NO HIGH GRADE DYSPLASIA OR INVASIVE MALIGNANCY IDENTIFIED.  Review of Systems Past Medical History:  Diagnosis Date  . CHF (congestive heart failure) (HCC)   . COPD (chronic obstructive pulmonary disease) (HCC)   . Diabetes mellitus without complication (HCC)   . Ganglion cyst   . Headache   . Heart murmur   . Hypertension   . Insulin pump in place   . Neuropathy in diabetes (HCC)   . Peripheral vascular disease (HCC)    a. prior stenting, right Fem-Pop bypass graft b. 04/2015: s/p PTA and stenting of R external iliac  . Respiratory failure (HCC)   . Shortness of breath dyspnea     Past Surgical History:  Procedure Laterality Date  . CARPAL TUNNEL RELEASE    . FEMORAL-FEMORAL BYPASS GRAFT     right and left legs  . ILIAC VEIN ANGIOPLASTY / STENTING Right 05/07/2015    Allergies  Allergen Reactions  . Lisinopril-Hydrochlorothiazide Hives and Itching  . Penicillins   . Sulfonamide Derivatives Other (See Comments)    Hives    Current Outpatient Medications on File Prior to Visit  Medication Sig Dispense Refill  . amLODipine (NORVASC) 10 MG tablet Take 1 tablet (10 mg total) by mouth daily. 30 tablet 1  . aspirin EC 81 MG EC tablet Take 1  tablet (81 mg total) by mouth daily.    . carvedilol (COREG) 25 MG tablet Take 1 tablet (25 mg total) 2 (two) times daily by mouth. (Patient taking differently: Take 25 mg 2 (two) times daily by mouth. 1 1/2 tabs BID) 180 tablet 3  . glipiZIDE (GLUCOTROL XL) 10 MG 24 hr tablet Take 10 mg by mouth daily with breakfast.    . hydrALAZINE (APRESOLINE) 25 MG tablet Take 1 tablet (25 mg total) by mouth every 8 (eight) hours. 90 tablet 1  . insulin lispro (HUMALOG) 100 UNIT/ML injection Inject into the skin. Via pump    . ipratropium-albuterol (DUONEB) 0.5-2.5 (3) MG/3ML SOLN Inhale 3 mLs into the lungs every 6 (six) hours as needed (for shortness of breath).     . losartan (COZAAR) 100 MG tablet Take 1 tablet (100 mg total) by mouth daily. 30 tablet 1  . pantoprazole (PROTONIX) 40 MG tablet Take 1 tablet by mouth daily.    . simvastatin (ZOCOR) 20 MG tablet Take 20 mg daily by mouth.     No current facility-administered medications on file prior to visit.         Objective:   Physical Exam Blood pressure 110/60, pulse 64, temperature 98.1 F (36.7 C), height 5\' 4"  (1.626 m), weight 221 lb 1.6 oz (100.3 kg). Alert and oriented. Skin  warm and dry. Oral mucosa is moist.   . Sclera anicteric, conjunctivae is pink. Thyroid not enlarged. No cervical lymphadenopathy. Lungs clear. Heart regular rate and rhythm.  Abdomen is soft. Bowel sounds are positive. No hepatomegaly. No abdominal masses felt. No tenderness.  No edema to lower extremities.  .        Assessment & Plan:  GERD. Will increase the Protonix to BID. ( Patient wants to be medically treated at this time).  GERD diet given to patient.  Due for a colonoscopy. Declined colonoscopy today.  OV in 4 months.

## 2017-01-30 ENCOUNTER — Ambulatory Visit: Payer: Medicare Other | Admitting: Adult Health

## 2017-03-14 ENCOUNTER — Ambulatory Visit: Payer: Medicare Other | Admitting: Nutrition

## 2017-03-24 ENCOUNTER — Other Ambulatory Visit: Payer: Self-pay | Admitting: Cardiology

## 2017-04-25 ENCOUNTER — Ambulatory Visit (INDEPENDENT_AMBULATORY_CARE_PROVIDER_SITE_OTHER): Payer: Medicare Other | Admitting: Internal Medicine

## 2017-04-25 ENCOUNTER — Encounter (INDEPENDENT_AMBULATORY_CARE_PROVIDER_SITE_OTHER): Payer: Self-pay | Admitting: Internal Medicine

## 2017-05-09 DIAGNOSIS — E1165 Type 2 diabetes mellitus with hyperglycemia: Secondary | ICD-10-CM | POA: Diagnosis not present

## 2017-06-28 ENCOUNTER — Other Ambulatory Visit (INDEPENDENT_AMBULATORY_CARE_PROVIDER_SITE_OTHER): Payer: Self-pay | Admitting: Internal Medicine

## 2017-08-01 ENCOUNTER — Other Ambulatory Visit: Payer: Self-pay

## 2017-08-01 NOTE — Patient Outreach (Signed)
[facility]) Care Management  08/01/2017  [name] M Imler [birth year] [id number]   Medication Adherence call to Mrs. Micael HampshireKathy Wedel patient did not answer [name] is due on Losartan 100 mg, Simvastatin 20 mg and Glipizide 10 mg Pharmacy said they do a pill pack for her every month but last month she pick up on May 31,2019. Mrs. Browm is showing past due under [employer] [facility] Ins.  [name] CPhT Pharmacy Technician [facility] Care Management Direct Dial [phone]  Fax [phone] [email or web address]

## 2017-08-18 DIAGNOSIS — E1165 Type 2 diabetes mellitus with hyperglycemia: Secondary | ICD-10-CM | POA: Diagnosis not present

## 2017-10-10 DIAGNOSIS — E782 Mixed hyperlipidemia: Secondary | ICD-10-CM | POA: Diagnosis not present

## 2017-10-10 DIAGNOSIS — E119 Type 2 diabetes mellitus without complications: Secondary | ICD-10-CM | POA: Diagnosis not present

## 2017-10-10 DIAGNOSIS — I739 Peripheral vascular disease, unspecified: Secondary | ICD-10-CM | POA: Diagnosis not present

## 2017-10-10 DIAGNOSIS — I10 Essential (primary) hypertension: Secondary | ICD-10-CM | POA: Diagnosis not present

## 2017-10-10 DIAGNOSIS — J449 Chronic obstructive pulmonary disease, unspecified: Secondary | ICD-10-CM | POA: Diagnosis not present

## 2017-10-12 DIAGNOSIS — E782 Mixed hyperlipidemia: Secondary | ICD-10-CM | POA: Diagnosis not present

## 2017-10-12 DIAGNOSIS — I739 Peripheral vascular disease, unspecified: Secondary | ICD-10-CM | POA: Diagnosis not present

## 2017-10-12 DIAGNOSIS — J449 Chronic obstructive pulmonary disease, unspecified: Secondary | ICD-10-CM | POA: Diagnosis not present

## 2017-10-12 DIAGNOSIS — E1165 Type 2 diabetes mellitus with hyperglycemia: Secondary | ICD-10-CM | POA: Diagnosis not present

## 2017-10-12 DIAGNOSIS — I10 Essential (primary) hypertension: Secondary | ICD-10-CM | POA: Diagnosis not present

## 2017-11-27 DIAGNOSIS — E1165 Type 2 diabetes mellitus with hyperglycemia: Secondary | ICD-10-CM | POA: Diagnosis not present

## 2017-12-07 DIAGNOSIS — D509 Iron deficiency anemia, unspecified: Secondary | ICD-10-CM | POA: Diagnosis not present

## 2017-12-07 DIAGNOSIS — I10 Essential (primary) hypertension: Secondary | ICD-10-CM | POA: Diagnosis not present

## 2017-12-07 DIAGNOSIS — E782 Mixed hyperlipidemia: Secondary | ICD-10-CM | POA: Diagnosis not present

## 2017-12-07 DIAGNOSIS — J449 Chronic obstructive pulmonary disease, unspecified: Secondary | ICD-10-CM | POA: Diagnosis not present

## 2017-12-18 ENCOUNTER — Other Ambulatory Visit: Payer: Self-pay

## 2017-12-18 NOTE — Patient Outreach (Signed)
[facility]) Care Management  12/18/2017  DEMMI SINDT November 01, 1950 [id number]   Medication Adherence  call to Mrs. Micael Hampshire patient did not answer patient is due on Losartan 100 mg and Simvastatin 40 mg under [facility] Ins.   Lillia Abed CPhT Pharmacy Technician [facility] Management Direct Dial [phone]  Fax [phone] [email or web address]

## 2018-01-02 DIAGNOSIS — E782 Mixed hyperlipidemia: Secondary | ICD-10-CM | POA: Diagnosis not present

## 2018-01-02 DIAGNOSIS — I10 Essential (primary) hypertension: Secondary | ICD-10-CM | POA: Diagnosis not present

## 2018-01-02 DIAGNOSIS — E119 Type 2 diabetes mellitus without complications: Secondary | ICD-10-CM | POA: Diagnosis not present

## 2018-01-02 DIAGNOSIS — J449 Chronic obstructive pulmonary disease, unspecified: Secondary | ICD-10-CM | POA: Diagnosis not present

## 2018-01-15 ENCOUNTER — Other Ambulatory Visit: Payer: Self-pay

## 2018-01-15 NOTE — Patient Outreach (Signed)
[facility]) Care Management  01/15/2018  [name] M Williams 06/22/1950 [id number]   Medication Adherence call to Mrs. [name] patient's telephone number has restriction we are not able to get in contact with patient she is due on Losartan 100 mg and Simvastatin 40 mg. [name] is showing past due under [facility].   [name] CPhT Pharmacy Technician [facility] Care Management Direct Dial [phone]  Fax [phone] [name]

## 2018-01-18 DIAGNOSIS — I10 Essential (primary) hypertension: Secondary | ICD-10-CM | POA: Diagnosis not present

## 2018-01-18 DIAGNOSIS — E782 Mixed hyperlipidemia: Secondary | ICD-10-CM | POA: Diagnosis not present

## 2018-01-18 DIAGNOSIS — E1165 Type 2 diabetes mellitus with hyperglycemia: Secondary | ICD-10-CM | POA: Diagnosis not present

## 2018-01-18 DIAGNOSIS — D509 Iron deficiency anemia, unspecified: Secondary | ICD-10-CM | POA: Diagnosis not present

## 2018-01-18 DIAGNOSIS — E119 Type 2 diabetes mellitus without complications: Secondary | ICD-10-CM | POA: Diagnosis not present

## 2018-01-24 DIAGNOSIS — E782 Mixed hyperlipidemia: Secondary | ICD-10-CM | POA: Diagnosis not present

## 2018-01-24 DIAGNOSIS — I10 Essential (primary) hypertension: Secondary | ICD-10-CM | POA: Diagnosis not present

## 2018-01-24 DIAGNOSIS — E1165 Type 2 diabetes mellitus with hyperglycemia: Secondary | ICD-10-CM | POA: Diagnosis not present

## 2018-01-24 DIAGNOSIS — J449 Chronic obstructive pulmonary disease, unspecified: Secondary | ICD-10-CM | POA: Diagnosis not present

## 2018-01-24 DIAGNOSIS — Z0000 Encounter for general adult medical examination without abnormal findings: Secondary | ICD-10-CM | POA: Diagnosis not present

## 2018-03-06 DIAGNOSIS — E1165 Type 2 diabetes mellitus with hyperglycemia: Secondary | ICD-10-CM | POA: Diagnosis not present

## 2018-04-24 ENCOUNTER — Other Ambulatory Visit: Payer: Self-pay

## 2018-04-24 NOTE — Patient Outreach (Signed)
[facility]) Care Management  04/24/2018  Katherine Williams 25-Apr-1950 [id number]   Medication Adherence call to Katherine Williams patient's telephone number has restrictions ,patient is due on all her medications [facility] said they pill pack every month for patient. Katherine Williams, Katherine Williams is showing past due under [facility] Ins.   Katherine Williams CPhT Pharmacy Technician [facility] Management Direct Dial [phone]  Fax [phone] [email or web address]

## 2018-06-07 DIAGNOSIS — I739 Peripheral vascular disease, unspecified: Secondary | ICD-10-CM | POA: Diagnosis not present

## 2018-06-07 DIAGNOSIS — E1165 Type 2 diabetes mellitus with hyperglycemia: Secondary | ICD-10-CM | POA: Diagnosis not present

## 2018-06-11 DIAGNOSIS — E1165 Type 2 diabetes mellitus with hyperglycemia: Secondary | ICD-10-CM | POA: Diagnosis not present

## 2018-06-14 DIAGNOSIS — E1165 Type 2 diabetes mellitus with hyperglycemia: Secondary | ICD-10-CM | POA: Diagnosis not present

## 2018-06-22 DIAGNOSIS — Z0000 Encounter for general adult medical examination without abnormal findings: Secondary | ICD-10-CM | POA: Diagnosis not present

## 2018-07-03 DIAGNOSIS — E119 Type 2 diabetes mellitus without complications: Secondary | ICD-10-CM | POA: Diagnosis not present

## 2018-07-03 DIAGNOSIS — Z794 Long term (current) use of insulin: Secondary | ICD-10-CM | POA: Diagnosis not present

## 2018-07-05 DIAGNOSIS — L03032 Cellulitis of left toe: Secondary | ICD-10-CM | POA: Diagnosis not present

## 2018-07-05 DIAGNOSIS — M79671 Pain in right foot: Secondary | ICD-10-CM | POA: Diagnosis not present

## 2018-07-05 DIAGNOSIS — M79674 Pain in right toe(s): Secondary | ICD-10-CM | POA: Diagnosis not present

## 2018-07-05 DIAGNOSIS — L600 Ingrowing nail: Secondary | ICD-10-CM | POA: Diagnosis not present

## 2018-07-06 DIAGNOSIS — M19019 Primary osteoarthritis, unspecified shoulder: Secondary | ICD-10-CM | POA: Diagnosis not present

## 2018-07-06 DIAGNOSIS — M25511 Pain in right shoulder: Secondary | ICD-10-CM | POA: Diagnosis not present

## 2018-07-19 DIAGNOSIS — L600 Ingrowing nail: Secondary | ICD-10-CM | POA: Diagnosis not present

## 2018-07-19 DIAGNOSIS — M79675 Pain in left toe(s): Secondary | ICD-10-CM | POA: Diagnosis not present

## 2018-07-19 DIAGNOSIS — M79672 Pain in left foot: Secondary | ICD-10-CM | POA: Diagnosis not present

## 2018-07-24 DIAGNOSIS — M79671 Pain in right foot: Secondary | ICD-10-CM | POA: Diagnosis not present

## 2018-07-24 DIAGNOSIS — E1140 Type 2 diabetes mellitus with diabetic neuropathy, unspecified: Secondary | ICD-10-CM | POA: Diagnosis not present

## 2018-07-24 DIAGNOSIS — L110 Acquired keratosis follicularis: Secondary | ICD-10-CM | POA: Diagnosis not present

## 2018-07-24 DIAGNOSIS — E1151 Type 2 diabetes mellitus with diabetic peripheral angiopathy without gangrene: Secondary | ICD-10-CM | POA: Diagnosis not present

## 2018-07-24 DIAGNOSIS — I739 Peripheral vascular disease, unspecified: Secondary | ICD-10-CM | POA: Diagnosis not present

## 2018-07-25 ENCOUNTER — Other Ambulatory Visit: Payer: Self-pay

## 2018-07-25 NOTE — Patient Outreach (Signed)
[place] [facility]) Care Management  07/25/2018  Katherine Williams 11/23/1950 [id number]   Medication Adherence call to Katherine Williams Hippa Identifiers Verify spoke with patient she is past due on Simvastatin 40 mg patient explain she is no longer taking this medications because of side effects and is now seen a new doctor. Katherine Williams is past due under [place].   [place] Management Direct Dial [phone]  Fax [phone] [email or web address]

## 2018-08-03 ENCOUNTER — Other Ambulatory Visit: Payer: Self-pay | Admitting: Orthopedic Surgery

## 2018-08-03 DIAGNOSIS — M25511 Pain in right shoulder: Secondary | ICD-10-CM

## 2018-08-03 DIAGNOSIS — Z794 Long term (current) use of insulin: Secondary | ICD-10-CM | POA: Diagnosis not present

## 2018-08-03 DIAGNOSIS — E119 Type 2 diabetes mellitus without complications: Secondary | ICD-10-CM | POA: Diagnosis not present

## 2018-08-14 DIAGNOSIS — H5213 Myopia, bilateral: Secondary | ICD-10-CM | POA: Diagnosis not present

## 2018-08-20 ENCOUNTER — Other Ambulatory Visit: Payer: Self-pay

## 2018-08-20 NOTE — Patient Outreach (Signed)
[place] [facility]) Care Management  08/20/2018  THANH POMERLEAU 20-Feb-1950 [id number]   Medication Adherence call to Mrs. [name] Hippa Identifiers Verify spoke with patient she is past due on Simvastatin 40 mg patient explain she is no longer taking this medication doctor took her off.Mrs. Hokenson is showing past due under [place].   [place] Management Direct Dial [phone]  Fax [phone] [email or web address]

## 2018-08-28 ENCOUNTER — Ambulatory Visit
Admission: RE | Admit: 2018-08-28 | Discharge: 2018-08-28 | Disposition: A | Discharge status: Home / Self Care | Payer: Medicare Other | Source: Ambulatory Visit | Attending: Orthopedic Surgery | Admitting: Orthopedic Surgery

## 2018-08-28 ENCOUNTER — Other Ambulatory Visit: Payer: Self-pay

## 2018-08-28 DIAGNOSIS — H9203 Otalgia, bilateral: Secondary | ICD-10-CM | POA: Diagnosis not present

## 2018-08-28 DIAGNOSIS — H6502 Acute serous otitis media, left ear: Secondary | ICD-10-CM | POA: Diagnosis not present

## 2018-08-28 DIAGNOSIS — E782 Mixed hyperlipidemia: Secondary | ICD-10-CM | POA: Diagnosis not present

## 2018-08-28 DIAGNOSIS — E1165 Type 2 diabetes mellitus with hyperglycemia: Secondary | ICD-10-CM | POA: Diagnosis not present

## 2018-08-28 DIAGNOSIS — J449 Chronic obstructive pulmonary disease, unspecified: Secondary | ICD-10-CM | POA: Diagnosis not present

## 2018-08-28 DIAGNOSIS — I10 Essential (primary) hypertension: Secondary | ICD-10-CM | POA: Diagnosis not present

## 2018-08-28 DIAGNOSIS — D509 Iron deficiency anemia, unspecified: Secondary | ICD-10-CM | POA: Diagnosis not present

## 2018-08-28 DIAGNOSIS — M25511 Pain in right shoulder: Secondary | ICD-10-CM

## 2018-08-28 DIAGNOSIS — E119 Type 2 diabetes mellitus without complications: Secondary | ICD-10-CM | POA: Diagnosis not present

## 2018-09-02 DIAGNOSIS — Z794 Long term (current) use of insulin: Secondary | ICD-10-CM | POA: Diagnosis not present

## 2018-09-02 DIAGNOSIS — E119 Type 2 diabetes mellitus without complications: Secondary | ICD-10-CM | POA: Diagnosis not present

## 2018-09-04 DIAGNOSIS — E782 Mixed hyperlipidemia: Secondary | ICD-10-CM | POA: Diagnosis not present

## 2018-09-04 DIAGNOSIS — Z0000 Encounter for general adult medical examination without abnormal findings: Secondary | ICD-10-CM | POA: Diagnosis not present

## 2018-09-04 DIAGNOSIS — I10 Essential (primary) hypertension: Secondary | ICD-10-CM | POA: Diagnosis not present

## 2018-09-04 DIAGNOSIS — J449 Chronic obstructive pulmonary disease, unspecified: Secondary | ICD-10-CM | POA: Diagnosis not present

## 2018-09-04 DIAGNOSIS — E1165 Type 2 diabetes mellitus with hyperglycemia: Secondary | ICD-10-CM | POA: Diagnosis not present

## 2018-09-24 DIAGNOSIS — E119 Type 2 diabetes mellitus without complications: Secondary | ICD-10-CM | POA: Diagnosis not present

## 2018-09-24 DIAGNOSIS — E1165 Type 2 diabetes mellitus with hyperglycemia: Secondary | ICD-10-CM | POA: Diagnosis not present

## 2018-09-26 DIAGNOSIS — Z794 Long term (current) use of insulin: Secondary | ICD-10-CM | POA: Diagnosis not present

## 2018-09-26 DIAGNOSIS — E119 Type 2 diabetes mellitus without complications: Secondary | ICD-10-CM | POA: Diagnosis not present

## 2018-10-27 DIAGNOSIS — Z794 Long term (current) use of insulin: Secondary | ICD-10-CM | POA: Diagnosis not present

## 2018-10-27 DIAGNOSIS — E119 Type 2 diabetes mellitus without complications: Secondary | ICD-10-CM | POA: Diagnosis not present

## 2018-10-30 DIAGNOSIS — M79672 Pain in left foot: Secondary | ICD-10-CM | POA: Diagnosis not present

## 2018-10-30 DIAGNOSIS — I739 Peripheral vascular disease, unspecified: Secondary | ICD-10-CM | POA: Diagnosis not present

## 2018-10-30 DIAGNOSIS — E1140 Type 2 diabetes mellitus with diabetic neuropathy, unspecified: Secondary | ICD-10-CM | POA: Diagnosis not present

## 2018-10-30 DIAGNOSIS — M79671 Pain in right foot: Secondary | ICD-10-CM | POA: Diagnosis not present

## 2018-10-31 DIAGNOSIS — E1165 Type 2 diabetes mellitus with hyperglycemia: Secondary | ICD-10-CM | POA: Diagnosis not present

## 2018-10-31 DIAGNOSIS — E782 Mixed hyperlipidemia: Secondary | ICD-10-CM | POA: Diagnosis not present

## 2018-10-31 DIAGNOSIS — I10 Essential (primary) hypertension: Secondary | ICD-10-CM | POA: Diagnosis not present

## 2018-10-31 DIAGNOSIS — Z0000 Encounter for general adult medical examination without abnormal findings: Secondary | ICD-10-CM | POA: Diagnosis not present

## 2018-10-31 DIAGNOSIS — J449 Chronic obstructive pulmonary disease, unspecified: Secondary | ICD-10-CM | POA: Diagnosis not present

## 2018-11-10 DIAGNOSIS — H9203 Otalgia, bilateral: Secondary | ICD-10-CM | POA: Diagnosis not present

## 2018-11-10 DIAGNOSIS — H9313 Tinnitus, bilateral: Secondary | ICD-10-CM | POA: Diagnosis not present

## 2018-11-14 ENCOUNTER — Other Ambulatory Visit: Payer: Self-pay

## 2018-11-14 NOTE — Patient Outreach (Signed)
[place] [facility]) Care Management  11/14/2018  [name] 1950/12/18 [id number]   Medication Adherence call to Mrs. [name] Hippa Identifiers Verify spoke with patient she is past due on Glipizide Er 10 mg patient explain she is no longer taking this medication doctor took her off on Simvastatin she is taking a different medication for cholesterol. [name] is showing past due under [place].   [place] Management Direct Dial [phone]  Fax [phone] [email or web address]

## 2018-11-26 DIAGNOSIS — E119 Type 2 diabetes mellitus without complications: Secondary | ICD-10-CM | POA: Diagnosis not present

## 2018-11-26 DIAGNOSIS — Z794 Long term (current) use of insulin: Secondary | ICD-10-CM | POA: Diagnosis not present

## 2018-11-27 DIAGNOSIS — E119 Type 2 diabetes mellitus without complications: Secondary | ICD-10-CM | POA: Diagnosis not present

## 2018-11-27 DIAGNOSIS — Z794 Long term (current) use of insulin: Secondary | ICD-10-CM | POA: Diagnosis not present

## 2018-12-06 DIAGNOSIS — I10 Essential (primary) hypertension: Secondary | ICD-10-CM | POA: Diagnosis not present

## 2018-12-06 DIAGNOSIS — E782 Mixed hyperlipidemia: Secondary | ICD-10-CM | POA: Diagnosis not present

## 2018-12-06 DIAGNOSIS — E119 Type 2 diabetes mellitus without complications: Secondary | ICD-10-CM | POA: Diagnosis not present

## 2018-12-06 DIAGNOSIS — D509 Iron deficiency anemia, unspecified: Secondary | ICD-10-CM | POA: Diagnosis not present

## 2018-12-06 DIAGNOSIS — E1165 Type 2 diabetes mellitus with hyperglycemia: Secondary | ICD-10-CM | POA: Diagnosis not present

## 2018-12-17 DIAGNOSIS — Z0000 Encounter for general adult medical examination without abnormal findings: Secondary | ICD-10-CM | POA: Diagnosis not present

## 2018-12-17 DIAGNOSIS — J449 Chronic obstructive pulmonary disease, unspecified: Secondary | ICD-10-CM | POA: Diagnosis not present

## 2018-12-17 DIAGNOSIS — I10 Essential (primary) hypertension: Secondary | ICD-10-CM | POA: Diagnosis not present

## 2018-12-17 DIAGNOSIS — E1165 Type 2 diabetes mellitus with hyperglycemia: Secondary | ICD-10-CM | POA: Diagnosis not present

## 2018-12-17 DIAGNOSIS — E782 Mixed hyperlipidemia: Secondary | ICD-10-CM | POA: Diagnosis not present

## 2018-12-31 DIAGNOSIS — E119 Type 2 diabetes mellitus without complications: Secondary | ICD-10-CM | POA: Diagnosis not present

## 2018-12-31 DIAGNOSIS — Z794 Long term (current) use of insulin: Secondary | ICD-10-CM | POA: Diagnosis not present

## 2019-01-02 DIAGNOSIS — E1165 Type 2 diabetes mellitus with hyperglycemia: Secondary | ICD-10-CM | POA: Diagnosis not present

## 2019-01-02 DIAGNOSIS — E119 Type 2 diabetes mellitus without complications: Secondary | ICD-10-CM | POA: Diagnosis not present

## 2019-01-07 DIAGNOSIS — I10 Essential (primary) hypertension: Secondary | ICD-10-CM | POA: Diagnosis not present

## 2019-01-07 DIAGNOSIS — E1165 Type 2 diabetes mellitus with hyperglycemia: Secondary | ICD-10-CM | POA: Diagnosis not present

## 2019-01-07 DIAGNOSIS — E782 Mixed hyperlipidemia: Secondary | ICD-10-CM | POA: Diagnosis not present

## 2019-01-07 DIAGNOSIS — J449 Chronic obstructive pulmonary disease, unspecified: Secondary | ICD-10-CM | POA: Diagnosis not present

## 2019-01-30 DIAGNOSIS — E1165 Type 2 diabetes mellitus with hyperglycemia: Secondary | ICD-10-CM | POA: Diagnosis not present

## 2019-01-30 DIAGNOSIS — E119 Type 2 diabetes mellitus without complications: Secondary | ICD-10-CM | POA: Diagnosis not present

## 2019-01-30 DIAGNOSIS — E7849 Other hyperlipidemia: Secondary | ICD-10-CM | POA: Diagnosis not present

## 2019-01-30 DIAGNOSIS — I739 Peripheral vascular disease, unspecified: Secondary | ICD-10-CM | POA: Diagnosis not present

## 2019-01-30 DIAGNOSIS — Z794 Long term (current) use of insulin: Secondary | ICD-10-CM | POA: Diagnosis not present

## 2019-01-30 DIAGNOSIS — J449 Chronic obstructive pulmonary disease, unspecified: Secondary | ICD-10-CM | POA: Diagnosis not present

## 2019-01-30 DIAGNOSIS — I10 Essential (primary) hypertension: Secondary | ICD-10-CM | POA: Diagnosis not present

## 2019-03-02 DIAGNOSIS — Z794 Long term (current) use of insulin: Secondary | ICD-10-CM | POA: Diagnosis not present

## 2019-03-02 DIAGNOSIS — E119 Type 2 diabetes mellitus without complications: Secondary | ICD-10-CM | POA: Diagnosis not present

## 2019-03-13 DIAGNOSIS — I739 Peripheral vascular disease, unspecified: Secondary | ICD-10-CM | POA: Diagnosis not present

## 2019-03-13 DIAGNOSIS — E1165 Type 2 diabetes mellitus with hyperglycemia: Secondary | ICD-10-CM | POA: Diagnosis not present

## 2019-03-13 DIAGNOSIS — E7849 Other hyperlipidemia: Secondary | ICD-10-CM | POA: Diagnosis not present

## 2019-03-13 DIAGNOSIS — I10 Essential (primary) hypertension: Secondary | ICD-10-CM | POA: Diagnosis not present

## 2019-03-13 DIAGNOSIS — J449 Chronic obstructive pulmonary disease, unspecified: Secondary | ICD-10-CM | POA: Diagnosis not present

## 2019-04-01 DIAGNOSIS — E119 Type 2 diabetes mellitus without complications: Secondary | ICD-10-CM | POA: Diagnosis not present

## 2019-04-01 DIAGNOSIS — E782 Mixed hyperlipidemia: Secondary | ICD-10-CM | POA: Diagnosis not present

## 2019-04-01 DIAGNOSIS — D509 Iron deficiency anemia, unspecified: Secondary | ICD-10-CM | POA: Diagnosis not present

## 2019-04-01 DIAGNOSIS — I739 Peripheral vascular disease, unspecified: Secondary | ICD-10-CM | POA: Diagnosis not present

## 2019-04-01 DIAGNOSIS — J449 Chronic obstructive pulmonary disease, unspecified: Secondary | ICD-10-CM | POA: Diagnosis not present

## 2019-04-01 DIAGNOSIS — E1165 Type 2 diabetes mellitus with hyperglycemia: Secondary | ICD-10-CM | POA: Diagnosis not present

## 2019-04-01 DIAGNOSIS — I10 Essential (primary) hypertension: Secondary | ICD-10-CM | POA: Diagnosis not present

## 2019-04-04 DIAGNOSIS — E119 Type 2 diabetes mellitus without complications: Secondary | ICD-10-CM | POA: Diagnosis not present

## 2019-04-04 DIAGNOSIS — Z794 Long term (current) use of insulin: Secondary | ICD-10-CM | POA: Diagnosis not present

## 2019-04-12 DIAGNOSIS — E1165 Type 2 diabetes mellitus with hyperglycemia: Secondary | ICD-10-CM | POA: Diagnosis not present

## 2019-04-12 DIAGNOSIS — E119 Type 2 diabetes mellitus without complications: Secondary | ICD-10-CM | POA: Diagnosis not present

## 2019-05-02 DIAGNOSIS — E119 Type 2 diabetes mellitus without complications: Secondary | ICD-10-CM | POA: Diagnosis not present

## 2019-05-02 DIAGNOSIS — Z794 Long term (current) use of insulin: Secondary | ICD-10-CM | POA: Diagnosis not present

## 2019-06-02 DIAGNOSIS — Z794 Long term (current) use of insulin: Secondary | ICD-10-CM | POA: Diagnosis not present

## 2019-06-02 DIAGNOSIS — E119 Type 2 diabetes mellitus without complications: Secondary | ICD-10-CM | POA: Diagnosis not present

## 2019-06-17 DIAGNOSIS — K219 Gastro-esophageal reflux disease without esophagitis: Secondary | ICD-10-CM | POA: Diagnosis not present

## 2019-06-17 DIAGNOSIS — I10 Essential (primary) hypertension: Secondary | ICD-10-CM | POA: Diagnosis not present

## 2019-06-17 DIAGNOSIS — E1165 Type 2 diabetes mellitus with hyperglycemia: Secondary | ICD-10-CM | POA: Diagnosis not present

## 2019-06-17 DIAGNOSIS — E782 Mixed hyperlipidemia: Secondary | ICD-10-CM | POA: Diagnosis not present

## 2019-06-17 DIAGNOSIS — Z0000 Encounter for general adult medical examination without abnormal findings: Secondary | ICD-10-CM | POA: Diagnosis not present

## 2019-07-09 DIAGNOSIS — M79672 Pain in left foot: Secondary | ICD-10-CM | POA: Diagnosis not present

## 2019-07-09 DIAGNOSIS — E1140 Type 2 diabetes mellitus with diabetic neuropathy, unspecified: Secondary | ICD-10-CM | POA: Diagnosis not present

## 2019-07-09 DIAGNOSIS — M79671 Pain in right foot: Secondary | ICD-10-CM | POA: Diagnosis not present

## 2019-07-09 DIAGNOSIS — I739 Peripheral vascular disease, unspecified: Secondary | ICD-10-CM | POA: Diagnosis not present

## 2019-07-17 DIAGNOSIS — D509 Iron deficiency anemia, unspecified: Secondary | ICD-10-CM | POA: Diagnosis not present

## 2019-07-17 DIAGNOSIS — E1165 Type 2 diabetes mellitus with hyperglycemia: Secondary | ICD-10-CM | POA: Diagnosis not present

## 2019-07-17 DIAGNOSIS — E782 Mixed hyperlipidemia: Secondary | ICD-10-CM | POA: Diagnosis not present

## 2019-07-17 DIAGNOSIS — E119 Type 2 diabetes mellitus without complications: Secondary | ICD-10-CM | POA: Diagnosis not present

## 2019-07-22 DIAGNOSIS — E1165 Type 2 diabetes mellitus with hyperglycemia: Secondary | ICD-10-CM | POA: Diagnosis not present

## 2019-07-22 DIAGNOSIS — E119 Type 2 diabetes mellitus without complications: Secondary | ICD-10-CM | POA: Diagnosis not present

## 2019-07-23 DIAGNOSIS — H524 Presbyopia: Secondary | ICD-10-CM | POA: Diagnosis not present

## 2019-07-23 DIAGNOSIS — E119 Type 2 diabetes mellitus without complications: Secondary | ICD-10-CM | POA: Diagnosis not present

## 2019-07-25 DIAGNOSIS — M25579 Pain in unspecified ankle and joints of unspecified foot: Secondary | ICD-10-CM | POA: Diagnosis not present

## 2019-07-25 DIAGNOSIS — M79671 Pain in right foot: Secondary | ICD-10-CM | POA: Diagnosis not present

## 2019-07-25 DIAGNOSIS — M79672 Pain in left foot: Secondary | ICD-10-CM | POA: Diagnosis not present

## 2019-08-01 DIAGNOSIS — Z794 Long term (current) use of insulin: Secondary | ICD-10-CM | POA: Diagnosis not present

## 2019-08-01 DIAGNOSIS — E119 Type 2 diabetes mellitus without complications: Secondary | ICD-10-CM | POA: Diagnosis not present

## 2019-08-14 DIAGNOSIS — Z0000 Encounter for general adult medical examination without abnormal findings: Secondary | ICD-10-CM | POA: Diagnosis not present

## 2019-08-14 DIAGNOSIS — E1165 Type 2 diabetes mellitus with hyperglycemia: Secondary | ICD-10-CM | POA: Diagnosis not present

## 2019-08-14 DIAGNOSIS — K219 Gastro-esophageal reflux disease without esophagitis: Secondary | ICD-10-CM | POA: Diagnosis not present

## 2019-08-14 DIAGNOSIS — I10 Essential (primary) hypertension: Secondary | ICD-10-CM | POA: Diagnosis not present

## 2019-08-14 DIAGNOSIS — E782 Mixed hyperlipidemia: Secondary | ICD-10-CM | POA: Diagnosis not present

## 2019-08-21 DIAGNOSIS — Z0000 Encounter for general adult medical examination without abnormal findings: Secondary | ICD-10-CM | POA: Diagnosis not present

## 2019-08-21 DIAGNOSIS — E1165 Type 2 diabetes mellitus with hyperglycemia: Secondary | ICD-10-CM | POA: Diagnosis not present

## 2019-08-21 DIAGNOSIS — J449 Chronic obstructive pulmonary disease, unspecified: Secondary | ICD-10-CM | POA: Diagnosis not present

## 2019-08-21 DIAGNOSIS — I10 Essential (primary) hypertension: Secondary | ICD-10-CM | POA: Diagnosis not present

## 2019-08-21 DIAGNOSIS — E782 Mixed hyperlipidemia: Secondary | ICD-10-CM | POA: Diagnosis not present

## 2019-08-22 DIAGNOSIS — M79671 Pain in right foot: Secondary | ICD-10-CM | POA: Diagnosis not present

## 2019-08-22 DIAGNOSIS — M79672 Pain in left foot: Secondary | ICD-10-CM | POA: Diagnosis not present

## 2019-08-22 DIAGNOSIS — M25579 Pain in unspecified ankle and joints of unspecified foot: Secondary | ICD-10-CM | POA: Diagnosis not present

## 2019-08-31 DIAGNOSIS — Z794 Long term (current) use of insulin: Secondary | ICD-10-CM | POA: Diagnosis not present

## 2019-08-31 DIAGNOSIS — E119 Type 2 diabetes mellitus without complications: Secondary | ICD-10-CM | POA: Diagnosis not present

## 2019-09-05 DIAGNOSIS — M79675 Pain in left toe(s): Secondary | ICD-10-CM | POA: Diagnosis not present

## 2019-09-05 DIAGNOSIS — M79672 Pain in left foot: Secondary | ICD-10-CM | POA: Diagnosis not present

## 2019-09-05 DIAGNOSIS — L600 Ingrowing nail: Secondary | ICD-10-CM | POA: Diagnosis not present

## 2019-09-18 DIAGNOSIS — I10 Essential (primary) hypertension: Secondary | ICD-10-CM | POA: Diagnosis not present

## 2019-09-18 DIAGNOSIS — J449 Chronic obstructive pulmonary disease, unspecified: Secondary | ICD-10-CM | POA: Diagnosis not present

## 2019-09-18 DIAGNOSIS — Z0000 Encounter for general adult medical examination without abnormal findings: Secondary | ICD-10-CM | POA: Diagnosis not present

## 2019-09-18 DIAGNOSIS — E782 Mixed hyperlipidemia: Secondary | ICD-10-CM | POA: Diagnosis not present

## 2019-09-18 DIAGNOSIS — E1165 Type 2 diabetes mellitus with hyperglycemia: Secondary | ICD-10-CM | POA: Diagnosis not present

## 2019-09-19 DIAGNOSIS — L600 Ingrowing nail: Secondary | ICD-10-CM | POA: Diagnosis not present

## 2019-09-19 DIAGNOSIS — M79675 Pain in left toe(s): Secondary | ICD-10-CM | POA: Diagnosis not present

## 2019-09-19 DIAGNOSIS — M79672 Pain in left foot: Secondary | ICD-10-CM | POA: Diagnosis not present

## 2019-09-19 DIAGNOSIS — L03032 Cellulitis of left toe: Secondary | ICD-10-CM | POA: Diagnosis not present

## 2019-10-01 DIAGNOSIS — Z794 Long term (current) use of insulin: Secondary | ICD-10-CM | POA: Diagnosis not present

## 2019-10-01 DIAGNOSIS — E119 Type 2 diabetes mellitus without complications: Secondary | ICD-10-CM | POA: Diagnosis not present

## 2019-10-03 DIAGNOSIS — L03032 Cellulitis of left toe: Secondary | ICD-10-CM | POA: Diagnosis not present

## 2019-10-03 DIAGNOSIS — M79672 Pain in left foot: Secondary | ICD-10-CM | POA: Diagnosis not present

## 2019-10-03 DIAGNOSIS — M79675 Pain in left toe(s): Secondary | ICD-10-CM | POA: Diagnosis not present

## 2019-10-03 DIAGNOSIS — L600 Ingrowing nail: Secondary | ICD-10-CM | POA: Diagnosis not present

## 2019-10-30 DIAGNOSIS — E119 Type 2 diabetes mellitus without complications: Secondary | ICD-10-CM | POA: Diagnosis not present

## 2019-10-30 DIAGNOSIS — E1165 Type 2 diabetes mellitus with hyperglycemia: Secondary | ICD-10-CM | POA: Diagnosis not present

## 2019-11-04 DIAGNOSIS — E1165 Type 2 diabetes mellitus with hyperglycemia: Secondary | ICD-10-CM | POA: Diagnosis not present

## 2019-11-04 DIAGNOSIS — Z0000 Encounter for general adult medical examination without abnormal findings: Secondary | ICD-10-CM | POA: Diagnosis not present

## 2019-11-04 DIAGNOSIS — E782 Mixed hyperlipidemia: Secondary | ICD-10-CM | POA: Diagnosis not present

## 2019-11-04 DIAGNOSIS — I10 Essential (primary) hypertension: Secondary | ICD-10-CM | POA: Diagnosis not present

## 2019-11-04 DIAGNOSIS — J449 Chronic obstructive pulmonary disease, unspecified: Secondary | ICD-10-CM | POA: Diagnosis not present

## 2019-12-14 DIAGNOSIS — Z0000 Encounter for general adult medical examination without abnormal findings: Secondary | ICD-10-CM | POA: Diagnosis not present

## 2019-12-14 DIAGNOSIS — I10 Essential (primary) hypertension: Secondary | ICD-10-CM | POA: Diagnosis not present

## 2019-12-14 DIAGNOSIS — J449 Chronic obstructive pulmonary disease, unspecified: Secondary | ICD-10-CM | POA: Diagnosis not present

## 2019-12-14 DIAGNOSIS — E782 Mixed hyperlipidemia: Secondary | ICD-10-CM | POA: Diagnosis not present

## 2019-12-14 DIAGNOSIS — E1165 Type 2 diabetes mellitus with hyperglycemia: Secondary | ICD-10-CM | POA: Diagnosis not present

## 2019-12-18 DIAGNOSIS — E1165 Type 2 diabetes mellitus with hyperglycemia: Secondary | ICD-10-CM | POA: Diagnosis not present

## 2019-12-18 DIAGNOSIS — I10 Essential (primary) hypertension: Secondary | ICD-10-CM | POA: Diagnosis not present

## 2019-12-18 DIAGNOSIS — J449 Chronic obstructive pulmonary disease, unspecified: Secondary | ICD-10-CM | POA: Diagnosis not present

## 2019-12-18 DIAGNOSIS — E782 Mixed hyperlipidemia: Secondary | ICD-10-CM | POA: Diagnosis not present

## 2019-12-18 DIAGNOSIS — I739 Peripheral vascular disease, unspecified: Secondary | ICD-10-CM | POA: Diagnosis not present

## 2019-12-26 DIAGNOSIS — K219 Gastro-esophageal reflux disease without esophagitis: Secondary | ICD-10-CM | POA: Diagnosis not present

## 2019-12-26 DIAGNOSIS — Z712 Person consulting for explanation of examination or test findings: Secondary | ICD-10-CM | POA: Diagnosis not present

## 2019-12-26 DIAGNOSIS — E1165 Type 2 diabetes mellitus with hyperglycemia: Secondary | ICD-10-CM | POA: Diagnosis not present

## 2020-01-02 DIAGNOSIS — I10 Essential (primary) hypertension: Secondary | ICD-10-CM | POA: Diagnosis not present

## 2020-01-02 DIAGNOSIS — E782 Mixed hyperlipidemia: Secondary | ICD-10-CM | POA: Diagnosis not present

## 2020-01-02 DIAGNOSIS — J449 Chronic obstructive pulmonary disease, unspecified: Secondary | ICD-10-CM | POA: Diagnosis not present

## 2020-01-02 DIAGNOSIS — E1165 Type 2 diabetes mellitus with hyperglycemia: Secondary | ICD-10-CM | POA: Diagnosis not present

## 2020-01-02 DIAGNOSIS — I739 Peripheral vascular disease, unspecified: Secondary | ICD-10-CM | POA: Diagnosis not present

## 2020-01-16 DIAGNOSIS — Z794 Long term (current) use of insulin: Secondary | ICD-10-CM | POA: Diagnosis not present

## 2020-01-16 DIAGNOSIS — E119 Type 2 diabetes mellitus without complications: Secondary | ICD-10-CM | POA: Diagnosis not present

## 2020-02-09 DIAGNOSIS — E1165 Type 2 diabetes mellitus with hyperglycemia: Secondary | ICD-10-CM | POA: Diagnosis not present

## 2020-02-09 DIAGNOSIS — E119 Type 2 diabetes mellitus without complications: Secondary | ICD-10-CM | POA: Diagnosis not present

## 2020-02-16 DIAGNOSIS — E119 Type 2 diabetes mellitus without complications: Secondary | ICD-10-CM | POA: Diagnosis not present

## 2020-02-16 DIAGNOSIS — Z794 Long term (current) use of insulin: Secondary | ICD-10-CM | POA: Diagnosis not present

## 2020-02-19 ENCOUNTER — Emergency Department (HOSPITAL_COMMUNITY): Payer: Medicare Other

## 2020-02-19 ENCOUNTER — Other Ambulatory Visit: Payer: Self-pay

## 2020-02-19 ENCOUNTER — Encounter (HOSPITAL_COMMUNITY): Payer: Self-pay | Admitting: Emergency Medicine

## 2020-02-19 ENCOUNTER — Inpatient Hospital Stay (HOSPITAL_COMMUNITY)
Admission: EM | Admit: 2020-02-19 | Discharge: 2020-02-27 | DRG: 253 | Disposition: A | Discharge status: Home Health | Payer: Medicare Other | Attending: Internal Medicine | Admitting: Internal Medicine

## 2020-02-19 DIAGNOSIS — Z7982 Long term (current) use of aspirin: Secondary | ICD-10-CM

## 2020-02-19 DIAGNOSIS — F17210 Nicotine dependence, cigarettes, uncomplicated: Secondary | ICD-10-CM | POA: Diagnosis not present

## 2020-02-19 DIAGNOSIS — Y832 Surgical operation with anastomosis, bypass or graft as the cause of abnormal reaction of the patient, or of later complication, without mention of misadventure at the time of the procedure: Secondary | ICD-10-CM | POA: Diagnosis present

## 2020-02-19 DIAGNOSIS — R200 Anesthesia of skin: Secondary | ICD-10-CM | POA: Diagnosis not present

## 2020-02-19 DIAGNOSIS — R202 Paresthesia of skin: Secondary | ICD-10-CM | POA: Diagnosis not present

## 2020-02-19 DIAGNOSIS — T82868A Thrombosis of vascular prosthetic devices, implants and grafts, initial encounter: Secondary | ICD-10-CM | POA: Diagnosis not present

## 2020-02-19 DIAGNOSIS — D509 Iron deficiency anemia, unspecified: Secondary | ICD-10-CM | POA: Diagnosis not present

## 2020-02-19 DIAGNOSIS — T82856A Stenosis of peripheral vascular stent, initial encounter: Secondary | ICD-10-CM | POA: Diagnosis not present

## 2020-02-19 DIAGNOSIS — D72828 Other elevated white blood cell count: Secondary | ICD-10-CM | POA: Diagnosis not present

## 2020-02-19 DIAGNOSIS — E669 Obesity, unspecified: Secondary | ICD-10-CM | POA: Diagnosis not present

## 2020-02-19 DIAGNOSIS — R0989 Other specified symptoms and signs involving the circulatory and respiratory systems: Secondary | ICD-10-CM

## 2020-02-19 DIAGNOSIS — M5136 Other intervertebral disc degeneration, lumbar region: Secondary | ICD-10-CM | POA: Diagnosis not present

## 2020-02-19 DIAGNOSIS — Z79899 Other long term (current) drug therapy: Secondary | ICD-10-CM

## 2020-02-19 DIAGNOSIS — Z20822 Contact with and (suspected) exposure to covid-19: Secondary | ICD-10-CM | POA: Diagnosis not present

## 2020-02-19 DIAGNOSIS — I745 Embolism and thrombosis of iliac artery: Secondary | ICD-10-CM | POA: Diagnosis not present

## 2020-02-19 DIAGNOSIS — I70221 Atherosclerosis of native arteries of extremities with rest pain, right leg: Secondary | ICD-10-CM | POA: Diagnosis not present

## 2020-02-19 DIAGNOSIS — Z794 Long term (current) use of insulin: Secondary | ICD-10-CM

## 2020-02-19 DIAGNOSIS — T82858A Stenosis of vascular prosthetic devices, implants and grafts, initial encounter: Secondary | ICD-10-CM | POA: Diagnosis not present

## 2020-02-19 DIAGNOSIS — E782 Mixed hyperlipidemia: Secondary | ICD-10-CM | POA: Diagnosis not present

## 2020-02-19 DIAGNOSIS — E1140 Type 2 diabetes mellitus with diabetic neuropathy, unspecified: Secondary | ICD-10-CM | POA: Diagnosis present

## 2020-02-19 DIAGNOSIS — J441 Chronic obstructive pulmonary disease with (acute) exacerbation: Secondary | ICD-10-CM | POA: Diagnosis not present

## 2020-02-19 DIAGNOSIS — E1151 Type 2 diabetes mellitus with diabetic peripheral angiopathy without gangrene: Secondary | ICD-10-CM | POA: Diagnosis not present

## 2020-02-19 DIAGNOSIS — I110 Hypertensive heart disease with heart failure: Secondary | ICD-10-CM | POA: Diagnosis not present

## 2020-02-19 DIAGNOSIS — Z6838 Body mass index (BMI) 38.0-38.9, adult: Secondary | ICD-10-CM

## 2020-02-19 DIAGNOSIS — K219 Gastro-esophageal reflux disease without esophagitis: Secondary | ICD-10-CM | POA: Diagnosis present

## 2020-02-19 DIAGNOSIS — Z7951 Long term (current) use of inhaled steroids: Secondary | ICD-10-CM

## 2020-02-19 DIAGNOSIS — M79604 Pain in right leg: Secondary | ICD-10-CM | POA: Diagnosis not present

## 2020-02-19 DIAGNOSIS — Z9641 Presence of insulin pump (external) (internal): Secondary | ICD-10-CM | POA: Diagnosis present

## 2020-02-19 DIAGNOSIS — Z90710 Acquired absence of both cervix and uterus: Secondary | ICD-10-CM

## 2020-02-19 DIAGNOSIS — M5137 Other intervertebral disc degeneration, lumbosacral region: Secondary | ICD-10-CM | POA: Diagnosis not present

## 2020-02-19 DIAGNOSIS — E876 Hypokalemia: Secondary | ICD-10-CM | POA: Diagnosis not present

## 2020-02-19 DIAGNOSIS — J449 Chronic obstructive pulmonary disease, unspecified: Secondary | ICD-10-CM | POA: Diagnosis present

## 2020-02-19 DIAGNOSIS — I10 Essential (primary) hypertension: Secondary | ICD-10-CM | POA: Diagnosis present

## 2020-02-19 DIAGNOSIS — Z833 Family history of diabetes mellitus: Secondary | ICD-10-CM | POA: Diagnosis not present

## 2020-02-19 DIAGNOSIS — E1159 Type 2 diabetes mellitus with other circulatory complications: Secondary | ICD-10-CM | POA: Diagnosis present

## 2020-02-19 DIAGNOSIS — I774 Celiac artery compression syndrome: Secondary | ICD-10-CM | POA: Diagnosis not present

## 2020-02-19 DIAGNOSIS — I5032 Chronic diastolic (congestive) heart failure: Secondary | ICD-10-CM | POA: Diagnosis not present

## 2020-02-19 DIAGNOSIS — M79671 Pain in right foot: Secondary | ICD-10-CM | POA: Diagnosis not present

## 2020-02-19 DIAGNOSIS — F17200 Nicotine dependence, unspecified, uncomplicated: Secondary | ICD-10-CM | POA: Diagnosis not present

## 2020-02-19 DIAGNOSIS — I739 Peripheral vascular disease, unspecified: Secondary | ICD-10-CM | POA: Diagnosis present

## 2020-02-19 DIAGNOSIS — I70222 Atherosclerosis of native arteries of extremities with rest pain, left leg: Secondary | ICD-10-CM | POA: Diagnosis not present

## 2020-02-19 DIAGNOSIS — M79606 Pain in leg, unspecified: Secondary | ICD-10-CM

## 2020-02-19 DIAGNOSIS — I701 Atherosclerosis of renal artery: Secondary | ICD-10-CM | POA: Diagnosis not present

## 2020-02-19 LAB — CBC
HCT: 39.4 % (ref 36.0–46.0)
Hemoglobin: 12.4 g/dL (ref 12.0–15.0)
MCH: 24.8 pg — ABNORMAL LOW (ref 26.0–34.0)
MCHC: 31.5 g/dL (ref 30.0–36.0)
MCV: 78.6 fL — ABNORMAL LOW (ref 80.0–100.0)
Platelets: 372 10*3/uL (ref 150–400)
RBC: 5.01 MIL/uL (ref 3.87–5.11)
RDW: 14.8 % (ref 11.5–15.5)
WBC: 10.4 10*3/uL (ref 4.0–10.5)
nRBC: 0 % (ref 0.0–0.2)

## 2020-02-19 LAB — BASIC METABOLIC PANEL
Anion gap: 11 (ref 5–15)
BUN: 13 mg/dL (ref 8–23)
CO2: 31 mmol/L (ref 22–32)
Calcium: 9.1 mg/dL (ref 8.9–10.3)
Chloride: 94 mmol/L — ABNORMAL LOW (ref 98–111)
Creatinine, Ser: 0.82 mg/dL (ref 0.44–1.00)
GFR, Estimated: >60 mL/min (ref >60)
Glucose, Bld: 164 mg/dL — ABNORMAL HIGH (ref 70–99)
Potassium: 2.6 mmol/L — CL (ref 3.5–5.1)
Sodium: 136 mmol/L (ref 135–145)

## 2020-02-19 LAB — CBG MONITORING, ED: Glucose-Capillary: 156 mg/dL — ABNORMAL HIGH (ref 70–99)

## 2020-02-19 LAB — RESP PANEL BY RT-PCR (FLU A&B, COVID) ARPGX2
Influenza A by PCR: NEGATIVE
Influenza B by PCR: NEGATIVE
SARS Coronavirus 2 by RT PCR: NEGATIVE

## 2020-02-19 LAB — MAGNESIUM: Magnesium: 1.8 mg/dL (ref 1.7–2.4)

## 2020-02-19 MED ORDER — HEPARIN BOLUS VIA INFUSION
4000.0000 [IU] | Freq: Once | INTRAVENOUS | Status: AC
  Administered 2020-02-19: 4000 [IU] via INTRAVENOUS

## 2020-02-19 MED ORDER — POTASSIUM CHLORIDE CRYS ER 20 MEQ PO TBCR
40.0000 meq | EXTENDED_RELEASE_TABLET | Freq: Once | ORAL | Status: AC
  Administered 2020-02-19: 40 meq via ORAL
  Filled 2020-02-19: qty 2

## 2020-02-19 MED ORDER — IBUPROFEN 400 MG PO TABS
400.0000 mg | ORAL_TABLET | Freq: Once | ORAL | Status: AC
  Administered 2020-02-19: 400 mg via ORAL
  Filled 2020-02-19: qty 1

## 2020-02-19 MED ORDER — POTASSIUM CHLORIDE 10 MEQ/100ML IV SOLN
10.0000 meq | INTRAVENOUS | Status: AC
  Administered 2020-02-19 (×3): 10 meq via INTRAVENOUS
  Filled 2020-02-19 (×3): qty 100

## 2020-02-19 MED ORDER — TRAMADOL HCL 50 MG PO TABS
50.0000 mg | ORAL_TABLET | Freq: Once | ORAL | Status: AC
  Administered 2020-02-19: 50 mg via ORAL
  Filled 2020-02-19: qty 1

## 2020-02-19 MED ORDER — INSULIN ASPART 100 UNIT/ML ~~LOC~~ SOLN: 0.0000 [IU] | Freq: Three times a day (TID) | SUBCUTANEOUS | Status: DC

## 2020-02-19 MED ORDER — POTASSIUM CHLORIDE CRYS ER 20 MEQ PO TBCR
40.0000 meq | EXTENDED_RELEASE_TABLET | Freq: Once | ORAL | Status: AC
  Administered 2020-02-20: 40 meq via ORAL
  Filled 2020-02-19: qty 2

## 2020-02-19 MED ORDER — IPRATROPIUM-ALBUTEROL 0.5-2.5 (3) MG/3ML IN SOLN: 3.0000 mL | Freq: Four times a day (QID) | RESPIRATORY_TRACT | Status: DC | PRN

## 2020-02-19 MED ORDER — OXYCODONE HCL 5 MG PO TABS
5.0000 mg | ORAL_TABLET | ORAL | Status: DC | PRN
  Administered 2020-02-21 – 2020-02-25 (×6): 5 mg via ORAL
  Filled 2020-02-19 (×7): qty 1

## 2020-02-19 MED ORDER — ACETAMINOPHEN 650 MG RE SUPP: 650.0000 mg | Freq: Four times a day (QID) | RECTAL | Status: DC | PRN

## 2020-02-19 MED ORDER — HEPARIN (PORCINE) 25000 UT/250ML-% IV SOLN
1150.0000 [IU]/h | INTRAVENOUS | Status: DC
  Administered 2020-02-19: 1250 [IU]/h via INTRAVENOUS
  Administered 2020-02-22 – 2020-02-23 (×2): 1150 [IU]/h via INTRAVENOUS
  Filled 2020-02-19 (×8): qty 250

## 2020-02-19 MED ORDER — AMLODIPINE BESYLATE 10 MG PO TABS
10.0000 mg | ORAL_TABLET | Freq: Every day | ORAL | Status: DC
  Administered 2020-02-19 – 2020-02-27 (×8): 10 mg via ORAL
  Filled 2020-02-19 (×2): qty 1
  Filled 2020-02-19 (×3): qty 2
  Filled 2020-02-19 (×3): qty 1
  Filled 2020-02-19: qty 2

## 2020-02-19 MED ORDER — CARVEDILOL 25 MG PO TABS
25.0000 mg | ORAL_TABLET | Freq: Two times a day (BID) | ORAL | Status: DC
  Administered 2020-02-19 – 2020-02-27 (×15): 25 mg via ORAL
  Filled 2020-02-19 (×3): qty 1
  Filled 2020-02-19 (×2): qty 2
  Filled 2020-02-19 (×2): qty 1
  Filled 2020-02-19 (×4): qty 2
  Filled 2020-02-19 (×5): qty 1

## 2020-02-19 MED ORDER — ACETAMINOPHEN 325 MG PO TABS
650.0000 mg | ORAL_TABLET | Freq: Four times a day (QID) | ORAL | Status: DC | PRN
  Administered 2020-02-21 – 2020-02-23 (×3): 650 mg via ORAL
  Filled 2020-02-19 (×3): qty 2

## 2020-02-19 MED ORDER — INSULIN DETEMIR 100 UNIT/ML ~~LOC~~ SOLN
12.0000 [IU] | Freq: Two times a day (BID) | SUBCUTANEOUS | Status: DC
  Administered 2020-02-19 – 2020-02-27 (×15): 12 [IU] via SUBCUTANEOUS
  Filled 2020-02-19 (×26): qty 0.12

## 2020-02-19 MED ORDER — ROSUVASTATIN CALCIUM 5 MG PO TABS
10.0000 mg | ORAL_TABLET | Freq: Every day | ORAL | Status: DC
  Administered 2020-02-19 – 2020-02-24 (×6): 10 mg via ORAL
  Filled 2020-02-19 (×2): qty 2
  Filled 2020-02-19: qty 1
  Filled 2020-02-19 (×2): qty 2
  Filled 2020-02-19: qty 1

## 2020-02-19 MED ORDER — INSULIN ASPART 100 UNIT/ML ~~LOC~~ SOLN: 0.0000 [IU] | Freq: Every day | SUBCUTANEOUS | Status: DC

## 2020-02-19 MED ORDER — PANTOPRAZOLE SODIUM 40 MG PO TBEC
40.0000 mg | DELAYED_RELEASE_TABLET | Freq: Two times a day (BID) | ORAL | Status: DC
  Administered 2020-02-20 – 2020-02-27 (×12): 40 mg via ORAL
  Filled 2020-02-19 (×13): qty 1

## 2020-02-19 MED ORDER — HYDRALAZINE HCL 25 MG PO TABS
25.0000 mg | ORAL_TABLET | Freq: Three times a day (TID) | ORAL | Status: DC
  Administered 2020-02-19 – 2020-02-27 (×22): 25 mg via ORAL
  Filled 2020-02-19 (×22): qty 1

## 2020-02-19 MED ORDER — ASPIRIN EC 81 MG PO TBEC
81.0000 mg | DELAYED_RELEASE_TABLET | Freq: Every day | ORAL | Status: DC
  Administered 2020-02-19 – 2020-02-27 (×8): 81 mg via ORAL
  Filled 2020-02-19 (×9): qty 1

## 2020-02-19 MED ORDER — MAGNESIUM SULFATE IN D5W 1-5 GM/100ML-% IV SOLN
1.0000 g | Freq: Once | INTRAVENOUS | Status: AC
  Administered 2020-02-20: 1 g via INTRAVENOUS
  Filled 2020-02-19: qty 100

## 2020-02-19 NOTE — Discharge Instructions (Addendum)
° °  [facility] ° °Discharge Instructions ° °Lower Extremity Angiogram; Angioplasty/Stenting ° °Please refer to the following instructions for your post-procedure care. Your surgeon or physician assistant will discuss any changes with you. ° °Activity ° °Avoid lifting more than 8 pounds (1 gallons of milk) for 72 hours (3 days) after your procedure. You may walk as much as you can tolerate. It's OK to drive after 72 hours. ° °Bathing/Showering ° °You may shower the day after your procedure. If you have a bandage, you may remove it at 24- 48 hours. Clean your incision site with mild soap and water. Pat the area dry with a clean towel. ° °Diet ° °Resume your pre-procedure diet. There are no special food restrictions following this procedure. All patients with peripheral vascular disease should follow a low fat/low cholesterol diet. In order to heal from your surgery, it is CRITICAL to get adequate nutrition. Your body requires vitamins, minerals, and protein. Vegetables are the best source of vitamins and minerals. Vegetables also provide the perfect balance of protein. Processed food has little nutritional value, so try to avoid this. ° °Medications ° °Resume taking all of your medications unless your doctor tells you not to. If your incision is causing pain, you may take over-the-counter pain relievers such as acetaminophen (Tylenol) ° °Follow Up ° °Follow up will be arranged at the time of your procedure. You may have an office visit scheduled or may be scheduled for surgery. Ask your surgeon if you have any questions. ° °Please call us immediately for any of the following conditions: °•Severe or worsening pain your legs or feet at rest or with walking. °•Increased pain, redness, drainage at your groin puncture site. °•Fever of 101 degrees or higher. °•If you have any mild or slow bleeding from your puncture site: lie down, apply firm constant pressure over the area with a piece of  gauze or a clean wash cloth for 30 minutes- no peeking!, call 911 right away if you are still bleeding after 30 minutes, or if the bleeding is heavy and unmanageable. ° °Reduce your risk factors of vascular disease: ° °Stop smoking. If you would like help call [facility] at 1-800-QUIT-NOW (1-800-784-8669) or [facility] at 336-586-4000. °Manage your cholesterol °Maintain a desired weight °Control your diabetes °Keep your blood pressure down ° °If you have any questions, please call the office at [phone] ° °

## 2020-02-19 NOTE — ED Triage Notes (Addendum)
Pt reports she has bilateral leg numbness when she is sitting or lying. Pt reports the right leg is worse than the left. Pulses were palpable in the left foot. Unable to doppler dorsalis pedis in right foot. Pt reports this problem started on 02/15/2020.

## 2020-02-19 NOTE — Progress Notes (Addendum)
ANTICOAGULATION CONSULT NOTE - Initial Consult  Pharmacy Consult for IV Heparin Indication: Critical PAD  Allergies  Allergen Reactions  . Lisinopril-Hydrochlorothiazide Hives and Itching  . Penicillins   . Sulfonamide Derivatives Other (See Comments)    Hives    Patient Measurements: Height: 5\' 4"  (162.6 cm) Weight: 102.1 kg (225 lb) IBW/kg (Calculated) : 54.7 Heparin Dosing Weight: 78.5 kg  Vital Signs: Temp: 98.9 F (37.2 C) (01/05 1529) Temp Source: Oral (01/05 1529) BP: 144/40 (01/05 1800) Pulse Rate: 60 (01/05 1800)  Labs: Recent Labs    02/19/20 1944  HGB 12.4  HCT 39.4  PLT 372  CREATININE 0.82    CrCl cannot be calculated (Patient's most recent lab result is older than the maximum 21 days allowed.).   Medical History: Past Medical History:  Diagnosis Date  . CHF (congestive heart failure) (HCC)   . COPD (chronic obstructive pulmonary disease) (HCC)   . Diabetes mellitus without complication (HCC)   . Ganglion cyst   . Headache   . Heart murmur   . Hypertension   . Insulin pump in place   . Neuropathy in diabetes (HCC)   . Peripheral vascular disease (HCC)    a. prior stenting, right Fem-Pop bypass graft b. 04/2015: s/p PTA and stenting of R external iliac  . Respiratory failure (HCC)   . Shortness of breath dyspnea     Assessment: 70 yr old female presented to [facility] ED with bilateral leg numbness when sitting/lying, R worse than left; pulses were palpable in L foot, but unable to doppler dorsalis pedis in R foot. Pharmacy is consulted for heparin dosing for critical PAD. Per med rec, pt was on no anticoagulants PTA.  H/H 12.4/39.4, platelets 372; Scr 0.82, CrCl 75.3 ml/min  [facility] consulted, plan to transfer pt to [facility] with tentative plan for angiogram tomorrow AM.  Goal of Therapy:  Heparin level 0.3-0.7 units/ml Monitor platelets by anticoagulation protocol: Yes   Plan:  Heparin 4000 units IV bolus X 1, followed  by heparin infusion at 1250 units/hr Check heparin level in 6 hours Monitor daily heparin level, CBC Monitor for signs/symptoms of bleeding  [facility], PharmD, BCPS, [facility] Clinical Pharmacist 02/19/2020,6:36 PM

## 2020-02-19 NOTE — ED Notes (Signed)
Date and time results received: 02/19/20 2024 Test: potassium Critical Value: 2.6  Name of Provider Notified: Denton Lank

## 2020-02-19 NOTE — ED Provider Notes (Addendum)
[facility] EMERGENCY DEPARTMENT Provider Note   CSN: [id number] Arrival date & time: 02/19/20  1505     History Chief Complaint  Patient presents with  . Numbness    Katherine Williams is a 70 y.o. female.  Patient c/o right low back pain radiating to right leg, and a sense of bilateral leg numbness, R > L for the past 5-6 days. Worse w certain positions, movements. Symptoms acute onset, moderate, persistent, recurrent, dull. Patient does have hx PVD. Denies pain being specifically worse w walking. No rest/calf or foot pain. Occasional numb/tingly sensation to RLE  > LLE. No leg swelling or redness. No back injury or strain. +hx DDD, 'slipped disc'. No saddle area numbness. No urinary retention, or incontinence. No fever or chills.   The history is provided by the patient.       Past Medical History:  Diagnosis Date  . CHF (congestive heart failure) (HCC)   . COPD (chronic obstructive pulmonary disease) (HCC)   . Diabetes mellitus without complication (HCC)   . Ganglion cyst   . Headache   . Heart murmur   . Hypertension   . Insulin pump in place   . Neuropathy in diabetes (HCC)   . Peripheral vascular disease (HCC)    a. prior stenting, right Fem-Pop bypass graft b. 04/2015: s/p PTA and stenting of R external iliac  . Respiratory failure (HCC)   . Shortness of breath dyspnea     Patient Active Problem List   Diagnosis Date Noted  . Class 2 severe obesity due to excess calories with serious comorbidity and body mass index (BMI) of 38.0 to 38.9 in adult (HCC) 08/29/2016  . Chronic diastolic heart failure (HCC) 06/08/2015  . Claudication (HCC) 05/07/2015  . Pain in the chest   . Chest pain   . DM type 2 causing vascular disease (HCC)   . Hypertensive urgency 02/24/2015  . Hypokalemia 02/24/2015  . Elevated troponin 02/24/2015  . Asthma exacerbation 02/23/2015  . COPD exacerbation (HCC) 02/23/2015  . LEG PAIN, BILATERAL 05/25/2006  . CHEST PAIN, ATYPICAL, HX OF  02/23/2006  . Mixed hyperlipidemia 12/21/2005  . Current smoker 12/21/2005  . SYNDROME, RESTLESS LEGS 12/21/2005  . SYNDROME, CARPAL TUNNEL 12/21/2005  . Essential hypertension 12/21/2005  . PERIPHERAL VASCULAR DISEASE 12/21/2005  . GERD 12/21/2005  . OSTEOARTHRITIS 12/21/2005  . HIP PAIN, CHRONIC 12/21/2005    Past Surgical History:  Procedure Laterality Date  . CARPAL TUNNEL RELEASE    . FEMORAL-FEMORAL BYPASS GRAFT     right and left legs  . ILIAC VEIN ANGIOPLASTY / STENTING Right 05/07/2015  . PERIPHERAL VASCULAR CATHETERIZATION N/A 05/07/2015   Procedure: Abdominal Aortogram;  Surgeon: Runell Gess, MD;  Location: [facility] INVASIVE CV LAB;  Service: Cardiovascular;  Laterality: N/A;  . PERIPHERAL VASCULAR CATHETERIZATION Bilateral 05/07/2015   Procedure: Lower Extremity Angiography;  Surgeon: Runell Gess, MD;  Location: [facility] INVASIVE CV LAB;  Service: Cardiovascular;  Laterality: Bilateral;  . PERIPHERAL VASCULAR CATHETERIZATION Right 05/07/2015   Procedure: Peripheral Vascular Intervention;  Surgeon: Runell Gess, MD;  Location: [facility] INVASIVE CV LAB;  Service: Cardiovascular;  Laterality: Right;  ext iliac     OB History   No obstetric history on file.     Family History  Problem Relation Age of Onset  . Congenital heart disease Mother   . Heart attack Maternal Grandmother   . Heart disease Paternal Grandfather   . Diabetes Daughter     Social History  Tobacco Use  . Smoking status: Current Every Day Smoker    Packs/day: 1.00    Years: 45.00    Pack years: 45.00    Types: Cigarettes    Start date: 06/29/1965  . Smokeless tobacco: Never Used  Vaping Use  . Vaping Use: Never used  Substance Use Topics  . Alcohol use: Yes    Comment: occasionally  . Drug use: Yes    Types: Marijuana    Home Medications Prior to Admission medications   Medication Sig Start Date End Date Taking? Authorizing Provider  amLODipine (NORVASC) 10 MG tablet Take 1 tablet (10 mg  total) by mouth daily. 02/26/15   Barton Dubois, MD  aspirin EC 81 MG EC tablet Take 1 tablet (81 mg total) by mouth daily. 05/08/15   Strader, Fransisco Hertz, PA-C  carvedilol (COREG) 12.5 MG tablet TAKE 1 & 1/2 TABLETS BY MOUTH TWICE DAILY 03/27/17   Arnoldo Lenis, MD  carvedilol (COREG) 25 MG tablet Take 1 tablet (25 mg total) 2 (two) times daily by mouth. Patient taking differently: Take 25 mg 2 (two) times daily by mouth. 1 1/2 tabs BID 12/23/16 03/23/17  Arnoldo Lenis, MD  glipiZIDE (GLUCOTROL XL) 10 MG 24 hr tablet Take 10 mg by mouth daily with breakfast.    [provider]  hydrALAZINE (APRESOLINE) 25 MG tablet Take 1 tablet (25 mg total) by mouth every 8 (eight) hours. 02/26/15   Barton Dubois, MD  insulin lispro (HUMALOG) 100 UNIT/ML injection Inject into the skin. Via pump    [provider]  ipratropium-albuterol (DUONEB) 0.5-2.5 (3) MG/3ML SOLN Inhale 3 mLs into the lungs every 6 (six) hours as needed (for shortness of breath).  10/25/16   [provider]  losartan (COZAAR) 100 MG tablet Take 1 tablet (100 mg total) by mouth daily. 02/26/15   Barton Dubois, MD  pantoprazole (PROTONIX) 40 MG tablet TAKE 1 TABLET BY MOUTH TWICE DAILY BEFORE A MEAL. 06/28/17   Setzer, Rona Ravens, NP  simvastatin (ZOCOR) 20 MG tablet Take 20 mg daily by mouth.    [provider]    Allergies    Lisinopril-hydrochlorothiazide, Penicillins, and Sulfonamide derivatives  Review of Systems   Review of Systems  Constitutional: Negative for chills and fever.  HENT: Negative for sore throat.   Eyes: Negative for redness.  Respiratory: Negative for shortness of breath.   Cardiovascular: Negative for chest pain.  Gastrointestinal: Negative for abdominal pain.  Genitourinary: Negative for flank pain.  Musculoskeletal: Negative for neck pain.  Skin: Negative for rash.  Neurological: Negative for weakness and headaches.  Hematological: Does not bruise/bleed easily.   Psychiatric/Behavioral: Negative for confusion.    Physical Exam Updated Vital Signs BP (!) 155/80 (BP Location: Right Arm)   Pulse 70   Temp 98.9 F (37.2 C) (Oral)   Resp 18   Ht 1.626 m (5\' 4" )   Wt 102.1 kg   SpO2 98%   BMI 38.62 kg/m   Physical Exam Vitals and nursing note reviewed.  Constitutional:      Appearance: Normal appearance. She is well-developed.  HENT:     Head: Atraumatic.     Nose: Nose normal.     Mouth/Throat:     Mouth: Mucous membranes are moist.  Eyes:     General: No scleral icterus.    Conjunctiva/sclera: Conjunctivae normal.  Neck:     Trachea: No tracheal deviation.  Cardiovascular:     Rate and Rhythm: Normal rate and regular  rhythm.     Pulses: Normal pulses.     Heart sounds: Normal heart sounds. No murmur heard. No friction rub. No gallop.   Pulmonary:     Effort: Pulmonary effort is normal. No respiratory distress.     Breath sounds: Normal breath sounds.  Abdominal:     General: Bowel sounds are normal. There is no distension.     Palpations: Abdomen is soft. There is no mass.     Tenderness: There is no abdominal tenderness.  Genitourinary:    Comments: No cva tenderness.  Musculoskeletal:        General: No swelling.     Cervical back: Normal range of motion and neck supple. No rigidity. No muscular tenderness.     Comments: TLS spine non tender, aligned, no step off. Good rom right hip and knee without pain. Unable to palpate distal pulses. On right, unable to palpate pulse or find pulse w bedside hand-held doppler.  On left unable to palpate pulse, but cp/pt signal present w dopppler. Right lower leg/foot slightly cooler to touch than left.   Skin:    General: Skin is warm and dry.     Findings: No rash.  Neurological:     Mental Status: She is alert.     Comments: Alert, speech normal. Motor intact bil, stre 5/5. sens grossly intact.   Psychiatric:        Mood and Affect: Mood normal.     ED Results / Procedures /  Treatments   Labs Results for orders placed or performed during the hospital encounter of 02/19/20  CBC  Result Value Ref Range   WBC 10.4 4.0 - 10.5 K/uL   RBC 5.01 3.87 - 5.11 MIL/uL   Hemoglobin 12.4 12.0 - 15.0 g/dL   HCT 71.6 96.7 - 89.3 %   MCV 78.6 (L) 80.0 - 100.0 fL   MCH 24.8 (L) 26.0 - 34.0 pg   MCHC 31.5 30.0 - 36.0 g/dL   RDW 81.0 17.5 - 10.2 %   Platelets 372 150 - 400 K/uL   nRBC 0.0 0.0 - 0.2 %  Basic metabolic panel  Result Value Ref Range   Sodium 136 135 - 145 mmol/L   Potassium 2.6 (LL) 3.5 - 5.1 mmol/L   Chloride 94 (L) 98 - 111 mmol/L   CO2 31 22 - 32 mmol/L   Glucose, Bld 164 (H) 70 - 99 mg/dL   BUN 13 8 - 23 mg/dL   Creatinine, Ser 5.85 0.44 - 1.00 mg/dL   Calcium 9.1 8.9 - 27.7 mg/dL   GFR, Estimated >82 >42 mL/min   Anion gap 11 5 - 15   DG Lumbar Spine Complete  Result Date: 02/19/2020 CLINICAL DATA:  Low back pain with lower extremity numbness EXAM: LUMBAR SPINE - COMPLETE 4+ VIEW COMPARISON:  None. FINDINGS: Frontal, lateral, spot lumbosacral lateral, and bilateral oblique views were obtained. There are 5 non-rib-bearing lumbar type vertebral bodies. There is no fracture or spondylolisthesis. There is moderately severe disc space narrowing at L5-S1 with milder disc space narrowing at L3-4 and L4-5. There is facet osteoarthritic change at L3-4, L4-5, and L5-S1 bilaterally. There is aortic atherosclerosis. There is iliac artery atherosclerosis with a stent in the right external iliac artery. IMPRESSION: No fracture or spondylolisthesis. 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 Disc space narrowing at L5-S1 and to a lesser degree at L3-4 and L4-5. Aortic Atherosclerosis (ICD10-I70.0). Electronically Signed   By: Bretta Bang III M.D.  On: 02/19/2020 17:16   US ARTERIAL ABI (SCREENING LOWER EXTREMITY)  Result Date: 02/19/2020 CLINICAL DATA:  Right leg numbness. EXAM: NONINVASIVE PHYSIOLOGIC VASCULAR STUDY OF BILATERAL LOWER  EXTREMITIES TECHNIQUE: Evaluation of both lower extremities were performed at rest, including calculation of ankle-brachial indices with single level Doppler, pressure and pulse volume recording. COMPARISON:  12/06/2016 FINDINGS: Right ABI: Could not be obtained due to lack of flow in the right ankle. Left ABI: 0.50 (previously 0.50) Right Lower Extremity: No arterial signal identified in the right dorsalis pedis artery or the right posterior tibial artery. Left Lower Extremity:  Monophasic waveforms in the left ankle. IMPRESSION: 1. No arterial flow identified in the right ankle. 2. Left ABI is stable, measuring 0.50. Findings are compatible with severe peripheral vascular disease in left lower extremity. These results were called by telephone at the time of interpretation on 02/19/2020 at 5:12 pm to provider [facility] , who verbally acknowledged these results. Electronically Signed   By: Richarda Overlie M.D.   On: 02/19/2020 17:14    EKG None  Radiology DG Lumbar Spine Complete  Result Date: 02/19/2020 CLINICAL DATA:  Low back pain with lower extremity numbness EXAM: LUMBAR SPINE - COMPLETE 4+ VIEW COMPARISON:  None. FINDINGS: Frontal, lateral, spot lumbosacral lateral, and bilateral oblique views were obtained. There are 5 non-rib-bearing lumbar type vertebral bodies. There is no fracture or spondylolisthesis. There is moderately severe disc space narrowing at L5-S1 with milder disc space narrowing at L3-4 and L4-5. There is facet osteoarthritic change at L3-4, L4-5, and L5-S1 bilaterally. There is aortic atherosclerosis. There is iliac artery atherosclerosis with a stent in the right external iliac artery. IMPRESSION: No fracture or spondylolisthesis. 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 Disc space narrowing at L5-S1 and to a lesser degree at L3-4 and L4-5. Aortic Atherosclerosis (ICD10-I70.0). Electronically Signed   By: Bretta Bang III M.D.   On: 02/19/2020 17:16   US  ARTERIAL ABI (SCREENING LOWER EXTREMITY)  Result Date: 02/19/2020 CLINICAL DATA:  Right leg numbness. EXAM: NONINVASIVE PHYSIOLOGIC VASCULAR STUDY OF BILATERAL LOWER EXTREMITIES TECHNIQUE: Evaluation of both lower extremities were performed at rest, including calculation of ankle-brachial indices with single level Doppler, pressure and pulse volume recording. COMPARISON:  12/06/2016 FINDINGS: Right ABI: Could not be obtained due to lack of flow in the right ankle. Left ABI: 0.50 (previously 0.50) Right Lower Extremity: No arterial signal identified in the right dorsalis pedis artery or the right posterior tibial artery. Left Lower Extremity:  Monophasic waveforms in the left ankle. IMPRESSION: 1. No arterial flow identified in the right ankle. 2. Left ABI is stable, measuring 0.50. Findings are compatible with severe peripheral vascular disease in left lower extremity. These results were called by telephone at the time of interpretation on 02/19/2020 at 5:12 pm to provider [facility] , who verbally acknowledged these results. Electronically Signed   By: Richarda Overlie M.D.   On: 02/19/2020 17:14    Procedures Procedures (including critical care time)  Medications Ordered in ED Medications  ibuprofen (ADVIL) tablet 400 mg (has no administration in time range)  traMADol (ULTRAM) tablet 50 mg (has no administration in time range)    ED Course  I have reviewed the triage vital signs and the nursing notes.  Pertinent labs & imaging results that were available during my care of the patient were reviewed by me and considered in my medical decision making (see chart for details).    MDM Rules/Calculators/A&P  Imaging ordered.  Reviewed nursing notes and prior charts for additional history.   Ibuprofen po, ultram po for symptom relief.   U/s tech indicates unable to find pulses on right.  Vascular surgery consulted.   Discussed with vascular surgeon, Dr Lenell Antu, he indicates  place on heparin, admit to hospitalist, transfer to [facility] so he can see there in consult, w tentative plan being angiogram tomorrow AM.  Heparin ordered.   Hospitalist consulted for admission.  Recheck pt, comfortable appearing, otherwise no change in exam from prior.   Labs reviewed/interpreted by me - hgb normal, wbc normal.   Additional labs reviewed/interpreted by me - k is very low. Ecg. Mg added to labs. Iv kcl, 10 meq over 1 hr, 3 runs. kcl po.   CRITICAL CARE RE: PVD with right leg pain/numbness, no pulses/ ABI, acute on chronic vascular insufficiency, severe hypokalemia w iv k replacement.  Performed by: Suzi Roots Total critical care time: 40 minutes Critical care time was exclusive of separately billable procedures and treating other patients. Critical care was necessary to treat or prevent imminent or life-threatening deterioration. Critical care was time spent personally by me on the following activities: development of treatment plan with patient and/or surrogate as well as nursing, discussions with consultants, evaluation of patient's response to treatment, examination of patient, obtaining history from patient or surrogate, ordering and performing treatments and interventions, ordering and review of laboratory studies, ordering and review of radiographic studies, pulse oximetry and re-evaluation of patient's condition.     Final Clinical Impression(s) / ED Diagnoses Final diagnoses:  None    Rx / DC Orders ED Discharge Orders    None             Cathren Laine, MD 02/19/20 2029

## 2020-02-19 NOTE — ED Notes (Signed)
Pt to xray

## 2020-02-19 NOTE — H&P (Signed)
TRH H&P   Patient Demographics:    Zariya Minner, is a 70 y.o. female  MRN: [id number]   DOB - 1950/11/14  Admit Date - 02/19/2020  Outpatient Primary MD for the patient is Benita Stabile, MD  Referring MD/NP/PA: Dr Arizona Constable  Patient coming from: Home  Chief Complaint  Patient presents with  . Numbness      HPI:    Lucia Harm  is a 70 y.o. female, medical history of diabetes mellitus type 2, on insulin pump, asthma, tobacco use disorder, hypertension, ganglion cyst, peripheral vascular disease, Bilateral superficial femoral artery occlusion, previous right femoropopliteal bypass recently found to be occluded in  March 2017 right external iliac stent 860 mm nitinol self-expanding by Dr. Allyson Sabal, repeat ABI and 05/2015 ABI right 0.4, left 0.5. Aortioiliac US showed stable . -Patient presents today with complaints of right lower back pain, radiating to right lower leg, as well she does report tingling and numbness in bilateral lower extremity, right> left, for the past 5 days, reports pain is worse with activity, movement, relieved by rest, patient reports history of peripheral vascular disease, reports she is compliant with her aspirin, but she is still smoking, she denies any fever, chills, chest pain or shortness of breath, patient reports she is vaccinated and boosted with Moderna. - in ED was significant for severe hypokalemia at 2.6(EKG is pending), she had ABI done, which did show  No arterial flow identified in the right ankle, and  Left ABI is stable, measuring 0.50. Findings are compatible with severe peripheral vascular disease in left lower extremity, pulses could not be felt in distal right lower extremity, and he can only detected by Dopplers on left lower extremity, ED discussed with vascular surgery at [facility] who recommended admission by hospitalist to [facility] and patient to be  started on heparin drip, and plan for angiogram tomorrow.    Review of systems:    In addition to the HPI above,  No Fever-chills, No Headache, No changes with Vision or hearing, No problems swallowing food or Liquids, No Chest pain, Cough or Shortness of Breath, No Abdominal pain, No Nausea or Vommitting, Bowel movements are regular, No Blood in stool or Urine, No dysuria, No new skin rashes or bruises, Reports slight lower extremity pain, tingling and numbness No recent weight gain or loss, No polyuria, polydypsia or polyphagia, No significant Mental Stressors.  A full 10 point Review of Systems was done, except as stated above, all other Review of Systems were negative.   With Past History of the following :    Past Medical History:  Diagnosis Date  . CHF (congestive heart failure) (HCC)   . COPD (chronic obstructive pulmonary disease) (HCC)   . Diabetes mellitus without complication (HCC)   . Ganglion cyst   . Headache   . Heart murmur   . Hypertension   . Insulin  pump in place   . Neuropathy in diabetes ([place])   . Peripheral vascular disease ([place])    a. prior stenting, right Fem-Pop bypass graft b. 04/2015: s/p PTA and stenting of R external iliac  . Respiratory failure ([place])   . Shortness of breath dyspnea       Past Surgical History:  Procedure Laterality Date  . CARPAL TUNNEL RELEASE    . FEMORAL-FEMORAL BYPASS GRAFT     right and left legs  . ILIAC VEIN ANGIOPLASTY / STENTING Right 05/07/2015  . PERIPHERAL VASCULAR CATHETERIZATION N/A 05/07/2015   Procedure: Abdominal Aortogram;  Surgeon: Lorretta Harp, MD;  Location: [place] [facility];  Service: Cardiovascular;  Laterality: N/A;  . PERIPHERAL VASCULAR CATHETERIZATION Bilateral 05/07/2015   Procedure: Lower Extremity Angiography;  Surgeon: Lorretta Harp, MD;  Location: [place] [facility];  Service: Cardiovascular;  Laterality: Bilateral;  . PERIPHERAL VASCULAR CATHETERIZATION Right 05/07/2015    Procedure: Peripheral Vascular Intervention;  Surgeon: Lorretta Harp, MD;  Location: [place] [facility];  Service: Cardiovascular;  Laterality: Right;  ext iliac      Social History:     Social History   Tobacco Use  . Smoking status: Current Every Day Smoker    Packs/day: 1.00    Years: 45.00    Pack years: 45.00    Types: Cigarettes    Start date: 06/29/1965  . Smokeless tobacco: Never Used  Substance Use Topics  . Alcohol use: Yes    Comment: occasionally       Family History :     Family History  Problem Relation Age of Onset  . Congenital heart disease Mother   . Heart attack Maternal Grandmother   . Heart disease Paternal Grandfather   . Diabetes Daughter       Home Medications:   Prior to Admission medications   Medication Sig Start Date End Date Taking? Authorizing Provider  amLODipine (NORVASC) 10 MG tablet Take 1 tablet (10 mg total) by mouth daily. 02/26/15  Yes Barton Dubois, MD  aspirin EC 81 MG EC tablet Take 1 tablet (81 mg total) by mouth daily. 05/08/15  Yes Strader, [place] M, PA-C  carvedilol (COREG) 25 MG tablet Take 1 tablet (25 mg total) 2 (two) times daily by mouth. Patient taking differently: Take 25 mg by mouth 2 (two) times daily. 1 1/2 tabs BID 12/23/16 03/23/17 Yes Branch, Alphonse Guild, MD  hydrALAZINE (APRESOLINE) 25 MG tablet Take 1 tablet (25 mg total) by mouth every 8 (eight) hours. 02/26/15  Yes Barton Dubois, MD  insulin lispro (HUMALOG) 100 UNIT/ML injection Inject 111 Units into the skin. Via pump 111 units daily   Yes [provider]  ipratropium-albuterol (DUONEB) 0.5-2.5 (3) MG/3ML SOLN Inhale 3 mLs into the lungs every 6 (six) hours as needed (for shortness of breath).  10/25/16  Yes [provider]  losartan (COZAAR) 100 MG tablet Take 1 tablet (100 mg total) by mouth daily. 02/26/15  Yes Barton Dubois, MD  pantoprazole (PROTONIX) 40 MG tablet TAKE 1 TABLET BY MOUTH TWICE DAILY BEFORE A MEAL. Patient taking  differently: Take 40 mg by mouth 2 (two) times daily before a meal. 06/28/17  Yes Setzer, Terri L, NP  rosuvastatin (CRESTOR) 10 MG tablet Take 10 mg by mouth at bedtime. 01/30/20  Yes [provider]  carvedilol (COREG) 12.5 MG tablet TAKE 1 & 1/2 TABLETS BY MOUTH TWICE DAILY 03/27/17   Arnoldo Lenis, MD  hydrochlorothiazide (HYDRODIURIL) 25 MG tablet  Take 25 mg by mouth daily. 01/30/20   [provider]     Allergies:     Allergies  Allergen Reactions  . Lisinopril-Hydrochlorothiazide Hives and Itching  . Penicillins   . Sulfonamide Derivatives Other (See Comments)    Hives     Physical Exam:   Vitals  Blood pressure (!) 155/93, pulse 60, temperature 98.9 F (37.2 C), temperature source Oral, resp. rate 16, height 5\' 4"  (1.626 m), weight 102.1 kg, SpO2 100 %.   1. General well developed female lying in bed in NAD,    2. Normal affect and insight, Not Suicidal or Homicidal, Awake Alert, Oriented X 3.  3. No F.N deficits, ALL C.Nerves Intact, Strength 5/5 all 4 extremities, Sensation intact all 4 extremities, Plantars down going.  4. Ears and Eyes appear Normal, Conjunctivae clear, PERRLA. Moist Oral Mucosa.  5. Supple Neck, No JVD, No cervical lymphadenopathy appriciated, No Carotid Bruits.  6. Symmetrical Chest wall movement, Good air movement bilaterally, CTAB.  7. RRR, No Gallops, Rubs or Murmurs, No Parasternal Heave.  8. Positive Bowel Sounds, Abdomen Soft, No tenderness, No organomegaly appriciated,No rebound -guarding or rigidity.  9. No pulse felt in the right foot, unable to find pulses with hand-held Dopplers, but warm, with no cynosis(was cooler earelier per patient and ED physican, even patient herself report it is feeling warmer now) left lower extremity unable to palpate pulses, but single present with Dopplers . warm, no cyanosis , Good muscle tone,  joints appear normal , no effusions, Normal ROM.  11. No Palpable Lymph Nodes in Neck or  Axillae    Data Review:    CBC Recent Labs  Lab 02/19/20 1944  WBC 10.4  HGB 12.4  HCT 39.4  PLT 372  MCV 78.6*  MCH 24.8*  MCHC 31.5  RDW 14.8   ------------------------------------------------------------------------------------------------------------------  Chemistries  Recent Labs  Lab 02/19/20 1944  NA 136  K 2.6*  CL 94*  CO2 31  GLUCOSE 164*  BUN 13  CREATININE 0.82  CALCIUM 9.1  MG 1.8   ------------------------------------------------------------------------------------------------------------------ estimated creatinine clearance is 75.3 mL/min (by C-G formula based on SCr of 0.82 mg/dL). ------------------------------------------------------------------------------------------------------------------ No results for input(s): TSH, T4TOTAL, T3FREE, THYROIDAB in the last 72 hours.  Invalid input(s): FREET3  Coagulation profile No results for input(s): INR, PROTIME in the last 168 hours. ------------------------------------------------------------------------------------------------------------------- No results for input(s): DDIMER in the last 72 hours. -------------------------------------------------------------------------------------------------------------------  Cardiac Enzymes No results for input(s): CKMB, TROPONINI, MYOGLOBIN in the last 168 hours.  Invalid input(s): CK ------------------------------------------------------------------------------------------------------------------    Component Value Date/Time   BNP 94.0 10/25/2016 2348     ---------------------------------------------------------------------------------------------------------------  Urinalysis No results found for: COLORURINE, APPEARANCEUR, LABSPEC, PHURINE, GLUCOSEU, HGBUR, BILIRUBINUR, KETONESUR, PROTEINUR, UROBILINOGEN, NITRITE, LEUKOCYTESUR  ----------------------------------------------------------------------------------------------------------------   Imaging  Results:    DG Lumbar Spine Complete  Result Date: 02/19/2020 CLINICAL DATA:  Low back pain with lower extremity numbness EXAM: LUMBAR SPINE - COMPLETE 4+ VIEW COMPARISON:  None. FINDINGS: Frontal, lateral, spot lumbosacral lateral, and bilateral oblique views were obtained. There are 5 non-rib-bearing lumbar type vertebral bodies. There is no fracture or spondylolisthesis. There is moderately severe disc space narrowing at L5-S1 with milder disc space narrowing at L3-4 and L4-5. There is facet osteoarthritic change at L3-4, L4-5, and L5-S1 bilaterally. There is aortic atherosclerosis. There is iliac artery atherosclerosis with a stent in the right external iliac artery. IMPRESSION: No fracture or spondylolisthesis. 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 Disc space narrowing at L5-S1  and to a lesser degree at L3-4 and L4-5. Aortic Atherosclerosis (ICD10-I70.0). Electronically Signed   By: Bretta Bang III M.D.   On: 02/19/2020 17:16   US ARTERIAL ABI (SCREENING LOWER EXTREMITY)  Result Date: 02/19/2020 CLINICAL DATA:  Right leg numbness. EXAM: NONINVASIVE PHYSIOLOGIC VASCULAR STUDY OF BILATERAL LOWER EXTREMITIES TECHNIQUE: Evaluation of both lower extremities were performed at rest, including calculation of ankle-brachial indices with single level Doppler, pressure and pulse volume recording. COMPARISON:  12/06/2016 FINDINGS: Right ABI: Could not be obtained due to lack of flow in the right ankle. Left ABI: 0.50 (previously 0.50) Right Lower Extremity: No arterial signal identified in the right dorsalis pedis artery or the right posterior tibial artery. Left Lower Extremity:  Monophasic waveforms in the left ankle. IMPRESSION: 1. No arterial flow identified in the right ankle. 2. Left ABI is stable, measuring 0.50. Findings are compatible with severe peripheral vascular disease in left lower extremity. These results were called by telephone at the time of interpretation on  02/19/2020 at 5:12 pm to provider [facility] , who verbally acknowledged these results. Electronically Signed   By: Richarda Overlie M.D.   On: 02/19/2020 17:14    My personal review of EKG: Pending   Assessment & Plan:    Active Problems:   Mixed hyperlipidemia   Current smoker   Essential hypertension   PERIPHERAL VASCULAR DISEASE   GERD   DM type 2 causing vascular disease (HCC)   Claudication (HCC)   Chronic diastolic heart failure (HCC)   PVD (peripheral vascular disease) with claudication (HCC)   Peripheral vascular disease with  claudication -Patient with known history of severe bilateral vascular disease, status post bypass surgery in the past, with stenting, appears to be acute on chronic vascular insufficiency. -ED physician discussed with vascular surgery Dr. Lenell Antu at [facility], who recommended hospitalist admission to [facility], and heparin drip, and plan for angiogram tomorrow. -As of infection, no fever, no leukocytosis no indication for antibiotics. -Continue with heparin GTT, aspirin. -Pain has resolved currently, leg is warm, no evidence of cyanosis -continue With home dose aspirin and statin - Bilateral superficial femoral artery occlusion, previous right femoropopliteal bypass recently found to be occluded, March 2017 right external iliac stent 860 mm nitinol self-expanding by Dr. Allyson Sabal, repeat ABI and 05/2015 ABI right 0.4, left 0.5. Aortioiliac US showed stable   COPD -No wheezing, no active flare, continue with as needed nebs  Tobacco abuse -She was counseled  Hypertension -Continue with home medication, but will hold lisinopril and hydrochlorothiazide  Type 2 diabetes mellitus -Patient on insulin pump, reported had to be removed before she got her CT scan, so she will be started on Levemir 12 units twice daily and insulin sliding scale, will consult diabetic coordinator  GERD -Continue with PPI  Hyperlipidemia -Continue with  statin  Hypokalemia -Repleted, recheck in a.m., will give 1 g of mag sulfate as well.   DVT Prophylaxis Heparin drip   AM Labs Ordered, also please review Full Orders  Family Communication: Admission, patients condition and plan of care including tests being ordered have been discussed with the patient and daughter frankie by phone who indicate understanding and agree with the plan and Code Status.  Code Status Full  Likely DC to  home  Condition GUARDED    Consults called: vascular surgery Dr Lenell Antu called by ED    Admission status:   inpatient  Time spent in minutes : 60 minutes   Huey Bienenstock M.D on  02/19/2020 at 9:47 PM   [facility] - Office  [phone]

## 2020-02-20 DIAGNOSIS — I10 Essential (primary) hypertension: Secondary | ICD-10-CM | POA: Diagnosis not present

## 2020-02-20 DIAGNOSIS — I739 Peripheral vascular disease, unspecified: Secondary | ICD-10-CM | POA: Diagnosis not present

## 2020-02-20 DIAGNOSIS — E876 Hypokalemia: Secondary | ICD-10-CM

## 2020-02-20 DIAGNOSIS — I5032 Chronic diastolic (congestive) heart failure: Secondary | ICD-10-CM | POA: Diagnosis not present

## 2020-02-20 LAB — CBC
HCT: 38 % (ref 36.0–46.0)
Hemoglobin: 11.6 g/dL — ABNORMAL LOW (ref 12.0–15.0)
MCH: 24.6 pg — ABNORMAL LOW (ref 26.0–34.0)
MCHC: 30.5 g/dL (ref 30.0–36.0)
MCV: 80.7 fL (ref 80.0–100.0)
Platelets: 272 10*3/uL (ref 150–400)
RBC: 4.71 MIL/uL (ref 3.87–5.11)
RDW: 14.8 % (ref 11.5–15.5)
WBC: 10.2 10*3/uL (ref 4.0–10.5)
nRBC: 0 % (ref 0.0–0.2)

## 2020-02-20 LAB — HIV ANTIBODY (ROUTINE TESTING W REFLEX): HIV Screen 4th Generation wRfx: NONREACTIVE

## 2020-02-20 LAB — BASIC METABOLIC PANEL
Anion gap: 10 (ref 5–15)
BUN: 12 mg/dL (ref 8–23)
CO2: 30 mmol/L (ref 22–32)
Calcium: 8.5 mg/dL — ABNORMAL LOW (ref 8.9–10.3)
Chloride: 97 mmol/L — ABNORMAL LOW (ref 98–111)
Creatinine, Ser: 0.86 mg/dL (ref 0.44–1.00)
GFR, Estimated: >60 mL/min (ref >60)
Glucose, Bld: 261 mg/dL — ABNORMAL HIGH (ref 70–99)
Potassium: 2.9 mmol/L — ABNORMAL LOW (ref 3.5–5.1)
Sodium: 137 mmol/L (ref 135–145)

## 2020-02-20 LAB — HEMOGLOBIN A1C
Hgb A1c MFr Bld: 8 % — ABNORMAL HIGH (ref 4.8–5.6)
Mean Plasma Glucose: 182.9 mg/dL

## 2020-02-20 LAB — HEPARIN LEVEL (UNFRACTIONATED)
Heparin Unfractionated: 0.54 IU/mL (ref 0.30–0.70)
Heparin Unfractionated: 0.5 IU/mL (ref 0.30–0.70)

## 2020-02-20 LAB — CBG MONITORING, ED
Glucose-Capillary: 132 mg/dL — ABNORMAL HIGH (ref 70–99)
Glucose-Capillary: 136 mg/dL — ABNORMAL HIGH (ref 70–99)
Glucose-Capillary: 138 mg/dL — ABNORMAL HIGH (ref 70–99)
Glucose-Capillary: 157 mg/dL — ABNORMAL HIGH (ref 70–99)
Glucose-Capillary: 169 mg/dL — ABNORMAL HIGH (ref 70–99)
Glucose-Capillary: 188 mg/dL — ABNORMAL HIGH (ref 70–99)
Glucose-Capillary: 189 mg/dL — ABNORMAL HIGH (ref 70–99)

## 2020-02-20 MED ORDER — POTASSIUM CHLORIDE CRYS ER 20 MEQ PO TBCR
40.0000 meq | EXTENDED_RELEASE_TABLET | ORAL | Status: AC
  Administered 2020-02-20: 40 meq via ORAL
  Filled 2020-02-20 (×2): qty 2

## 2020-02-20 MED ORDER — POTASSIUM CHLORIDE CRYS ER 20 MEQ PO TBCR
40.0000 meq | EXTENDED_RELEASE_TABLET | Freq: Once | ORAL | Status: AC
  Administered 2020-02-20: 40 meq via ORAL
  Filled 2020-02-20: qty 2

## 2020-02-20 MED ORDER — INSULIN ASPART 100 UNIT/ML ~~LOC~~ SOLN
0.0000 [IU] | SUBCUTANEOUS | Status: DC
  Administered 2020-02-20 (×2): 3 [IU] via SUBCUTANEOUS
  Administered 2020-02-20: 20:00:00 2 [IU] via SUBCUTANEOUS
  Administered 2020-02-21 (×3): 3 [IU] via SUBCUTANEOUS
  Administered 2020-02-21: 08:00:00 2 [IU] via SUBCUTANEOUS
  Administered 2020-02-22: 3 [IU] via SUBCUTANEOUS
  Administered 2020-02-22 (×3): 2 [IU] via SUBCUTANEOUS
  Administered 2020-02-23: 3 [IU] via SUBCUTANEOUS
  Administered 2020-02-23: 5 [IU] via SUBCUTANEOUS
  Administered 2020-02-23: 2 [IU] via SUBCUTANEOUS
  Administered 2020-02-23: 3 [IU] via SUBCUTANEOUS
  Administered 2020-02-23 – 2020-02-24 (×3): 2 [IU] via SUBCUTANEOUS
  Administered 2020-02-24 (×2): 5 [IU] via SUBCUTANEOUS
  Administered 2020-02-25: 3 [IU] via SUBCUTANEOUS
  Administered 2020-02-25: 2 [IU] via SUBCUTANEOUS
  Administered 2020-02-25 – 2020-02-26 (×3): 3 [IU] via SUBCUTANEOUS
  Administered 2020-02-26 (×5): 2 [IU] via SUBCUTANEOUS
  Administered 2020-02-26: 3 [IU] via SUBCUTANEOUS
  Administered 2020-02-27: 2 [IU] via SUBCUTANEOUS
  Filled 2020-02-20 (×3): qty 1

## 2020-02-20 NOTE — Progress Notes (Signed)
ANTICOAGULATION CONSULT NOTE - Follow Up Consult  Pharmacy Consult for heparin Indication: PAD  Labs: Recent Labs    02/19/20 1944 02/20/20 0147  HGB 12.4 11.6*  HCT 39.4 38.0  PLT 372 272  HEPARINUNFRC  --  0.54  CREATININE 0.82 0.86    Assessment/Plan:  69yo female therapeutic on heparin with initial dosing for PAD. Will continue gtt at current rate and confirm stable with additional level.   Vernard Gambles, PharmD, BCPS  02/20/2020,2:58 AM

## 2020-02-20 NOTE — Progress Notes (Addendum)
Inpatient Diabetes Program Recommendations  AACE/ADA: New Consensus Statement on Inpatient Glycemic Control   Target Ranges:  Prepandial:   less than 140 mg/dL      Peak postprandial:   less than 180 mg/dL (1-2 hours)      Critically ill patients:  140 - 180 mg/dL   Results for Katherine Williams, Katherine Williams (MRN [id number]) as of 02/20/2020 10:26  Ref. Range 02/19/2020 23:31 02/20/2020 08:07  Glucose-Capillary Latest Ref Range: 70 - 99 mg/dL 932 (H) 671 (H)   Results for Katherine Williams, Katherine Williams (MRN [id number]) as of 02/20/2020 10:26  Ref. Range 02/19/2020 19:44  Hemoglobin A1C Latest Ref Range: 4.8 - 5.6 % 8.0 (H)   Review of Glycemic Control  Diabetes history: DM2 Outpatient Diabetes medications: Insulin Pump with Regular insulin Current orders for Inpatient glycemic control: Levemir 12 units BID, Novolog 0-15 units Q4H   NOTE: Noted patient uses an insulin pump with Humalog insulin outpatient. Per H&P, patient removed insulin pump yesterday prior to CT. Diabetes Coordinator is working remotely and patient is currently still in the Emergency Room. Have called patient's cell phone multiple times but no answer. Per chart, patient's PCP is Dr. Margo Aye and patient's last appointment with Dr. Margo Aye was on 08/21/19.  Spoke with patient over the phone and she reports that she has a Medtronic insulin pump with Regular insulin (from [facility]). Patient reports that she sees Dr. Margo Aye and they work together to manage her pump. Patient confirms that she removed her insulin pump yesterday prior to CT and she has not reconnected her pump since then. Patient reviewed insulin pump settings are reports them as follows:  Basal insulin  12A  1.0 units/hour 10A  8.5 units/hour 12:30P  7 units/hour Total daily basal insulin: 111.75 units/24 hours  Carb Coverage 1:5 1 unit for every 5 grams of carbohydrates  Insulin Sensitivity 1:20  1 unit drops blood glucose 20 mg/dl  In talking with the patient she states that her blood glucose has been  running low over the past few days as she has not been able to eat a lot. Patient states that she has had her insulin pump off (suspended) a lot over the past 2 days due to frequent hypoglycemia.  Discussed current insulin orders and informed patient that glucose trends would be followed and adjustments made with insulin orders if needed.  Patient verbalized understanding of information discussed and states that she does not have any further questions related to diabetes at this time.   Thanks, Orlando Penner, RN, MSN, CDE Diabetes Coordinator Inpatient Diabetes Program [phone] (Team Pager from 8am to 5pm)

## 2020-02-20 NOTE — Progress Notes (Signed)
PROGRESS NOTE  Katherine Williams WVP:[id number] DOB: 10/18/1950 DOA: 02/19/2020 PCP: Benita Stabile, MD  HPI/Recap of past 75 hours: 70 year old female with past medical history of peripheral vascular disease, hypertension, diabetes mellitus and ongoing tobacco abuse who is status post bilateral superficial femoral artery occlusion and previous right femoral-popliteal bypass with stent placement presented to the emergency room on 1/5 with complaints of right lower back pain radiating down her right lower leg and some increased numbness and tingling in her lower extremities right greater than left.  In the emergency room, she was found to be hypokalemic with a potassium of 2.6 and an ABI done noted no arterial flow identified in the right ankle while left ABI was stable at 0.5.  Case was discussed with vascular surgery at [facility] recommended transfer there for angiogram.  Unfortunately, due to current Covid surge, no beds are available and patient still waiting for potential ER to ER transfer.  Patient seen down the emergency room this morning.  She is tired and complains of feeling a little bit cold.  Leg pain about the same.  Assessment/Plan: Principal Problem:   PVD (peripheral vascular disease) with claudication (HCC): Concerning for left leg ischemia.  Hopefully she will be able to be transferred to emergency room bed at [facility] for angiogram and to be seen by vascular surgery.  Continue heparin drip.  No signs of infection at this time. Active Problems:   Mixed hyperlipidemia   Current smoker: Counseling patient for tobacco abuse.  COPD: Currently stable.  On room air.    Essential hypertension: Continue with home medication, but holding HCTZ and lisinopril.    GERD: Continue PPI.    DM type 2 causing vascular disease (HCC): Every 4 hours sliding scale.    Chronic diastolic heart failure (HCC): Euvolemic at this time    Morbid obesity [facility]): Patient meets criteria for BMI greater than  35 along with comorbidities of hypertension and diabetes mellitus and peripheral vascular disease.  Hypokalemia: Replaced.  Code Status: Full code  Family Communication: Declined for me to call her daughter, she states she is keeping her daughter in the loop  Disposition Plan: Hopefully transferring to [facility], ER soon.   Consultants:  Vascular surgery  Procedures:  Angiogram when she is able to be transferred  Antimicrobials:  None  DVT prophylaxis: Heparin infusion   Objective: Vitals:   02/20/20 1300 02/20/20 1330  BP: (!) 171/88 (!) 129/106  Pulse: 61 63  Resp: (!) 21 (!) 21  Temp:    SpO2: 93% 98%    Intake/Output Summary (Last 24 hours) at 02/20/2020 1401 Last data filed at 02/20/2020 0047 Gross per 24 hour  Intake 100 ml  Output --  Net 100 ml   Filed Weights   02/19/20 1529  Weight: 102.1 kg   Body mass index is 38.62 kg/m.  Exam:   General: Alert and oriented x3, no acute distress  HEENT: Normocephalic and atraumatic, mucous membranes are moist  Cardiovascular: Regular rate and rhythm, S1-S2  Respiratory: Clear to auscultation bilaterally  Abdomen: Soft, nontender, nondistended, positive bowel sounds  Musculoskeletal: No clubbing or visual cyanosis.  Poor pulses in left leg.  It is slightly more warm than the right leg which is cool.  Minimal tenderness.  Skin: Dry skin, no signs of ischemia or necrosis  Psychiatry: Appropriate, no evidence of psychoses   Data Reviewed: CBC: Recent Labs  Lab 02/19/20 1944 02/20/20 0147  WBC 10.4 10.2  HGB 12.4  11.6*  HCT 39.4 38.0  MCV 78.6* 80.7  PLT 372 272   Basic Metabolic Panel: Recent Labs  Lab 02/19/20 1944 02/20/20 0147  NA 136 137  K 2.6* 2.9*  CL 94* 97*  CO2 31 30  GLUCOSE 164* 261*  BUN 13 12  CREATININE 0.82 0.86  CALCIUM 9.1 8.5*  MG 1.8  --    GFR: Estimated Creatinine Clearance: 71.8 mL/min (by C-G formula based on SCr of 0.86 mg/dL). Liver Function Tests: No  results for input(s): AST, ALT, ALKPHOS, BILITOT, PROT, ALBUMIN in the last 168 hours. No results for input(s): LIPASE, AMYLASE in the last 168 hours. No results for input(s): AMMONIA in the last 168 hours. Coagulation Profile: No results for input(s): INR, PROTIME in the last 168 hours. Cardiac Enzymes: No results for input(s): CKTOTAL, CKMB, CKMBINDEX, TROPONINI in the last 168 hours. BNP (last 3 results) No results for input(s): PROBNP in the last 8760 hours. HbA1C: Recent Labs    02/19/20 1944  HGBA1C 8.0*   CBG: Recent Labs  Lab 02/19/20 2331 02/20/20 0807 02/20/20 1114 02/20/20 1158  GLUCAP 156* 189* 169* 157*   Lipid Profile: No results for input(s): CHOL, HDL, LDLCALC, TRIG, CHOLHDL, LDLDIRECT in the last 72 hours. Thyroid Function Tests: No results for input(s): TSH, T4TOTAL, FREET4, T3FREE, THYROIDAB in the last 72 hours. Anemia Panel: No results for input(s): VITAMINB12, FOLATE, FERRITIN, TIBC, IRON, RETICCTPCT in the last 72 hours. Urine analysis: No results found for: COLORURINE, APPEARANCEUR, LABSPEC, PHURINE, GLUCOSEU, HGBUR, BILIRUBINUR, KETONESUR, PROTEINUR, UROBILINOGEN, NITRITE, LEUKOCYTESUR Sepsis Labs: @LABRCNTIP (procalcitonin:4,lacticidven:4)  ) Recent Results (from the past 240 hour(s))  Resp Panel by RT-PCR (Flu A&B, Covid) Nasopharyngeal Swab     Status: None   Collection Time: 02/19/20  9:37 PM   Specimen: Nasopharyngeal Swab; Nasopharyngeal(NP) swabs in vial transport medium  Result Value Ref Range Status   SARS Coronavirus 2 by RT PCR NEGATIVE NEGATIVE Final    Comment: (NOTE) SARS-CoV-2 target nucleic acids are NOT DETECTED.  The SARS-CoV-2 RNA is generally detectable in upper respiratory specimens during the acute phase of infection. The lowest concentration of SARS-CoV-2 viral copies this assay can detect is 138 copies/mL. A negative result does not preclude SARS-Cov-2 infection and should not be used as the sole basis for treatment  or other patient management decisions. A negative result may occur with  improper specimen collection/handling, submission of specimen other than nasopharyngeal swab, presence of viral mutation(s) within the areas targeted by this assay, and inadequate number of viral copies(<138 copies/mL). A negative result must be combined with clinical observations, patient history, and epidemiological information. The expected result is Negative.  Fact Sheet for Patients:  04/18/20  Fact Sheet for Healthcare Providers:  BloggerCourse.com  This test is no t yet approved or cleared by the [employer] FDA and  has been authorized for detection and/or diagnosis of SARS-CoV-2 by FDA under an Emergency Use Authorization (EUA). This EUA will remain  in effect (meaning this test can be used) for the duration of the COVID-19 declaration under Section 564(b)(1) of the Act, 21 U.S.C.section 360bbb-3(b)(1), unless the authorization is terminated  or revoked sooner.       Influenza A by PCR NEGATIVE NEGATIVE Final   Influenza B by PCR NEGATIVE NEGATIVE Final    Comment: (NOTE) The Xpert Xpress SARS-CoV-2/FLU/RSV plus assay is intended as an aid in the diagnosis of influenza from Nasopharyngeal swab specimens and should not be used as a sole basis for treatment. Nasal washings and  aspirates are unacceptable for Xpert Xpress SARS-CoV-2/FLU/RSV testing.  Fact Sheet for Patients: EntrepreneurPulse.com.au  Fact Sheet for Healthcare Providers: IncredibleEmployment.be  This test is not yet approved or cleared by the [place] FDA and has been authorized for detection and/or diagnosis of SARS-CoV-2 by FDA under an Emergency Use Authorization (EUA). This EUA will remain in effect (meaning this test can be used) for the duration of the COVID-19 declaration under Section 564(b)(1) of the Act, 21 U.S.C. section  360bbb-3(b)(1), unless the authorization is terminated or revoked.  Performed at [facility], [address], [place],  [address]       Studies: DG Lumbar Spine Complete  Result Date: 02/19/2020 CLINICAL DATA:  Low back pain with lower extremity numbness EXAM: LUMBAR SPINE - COMPLETE 4+ VIEW COMPARISON:  None. FINDINGS: Frontal, lateral, spot lumbosacral lateral, and bilateral oblique views were obtained. There are 5 non-rib-bearing lumbar type vertebral bodies. There is no fracture or spondylolisthesis. There is moderately severe disc space narrowing at L5-S1 with milder disc space narrowing at L3-4 and L4-5. There is facet osteoarthritic change at L3-4, L4-5, and L5-S1 bilaterally. There is aortic atherosclerosis. There is iliac artery atherosclerosis with a stent in the right external iliac artery. IMPRESSION: No fracture or spondylolisthesis. 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 Disc space narrowing at L5-S1 and to a lesser degree at L3-4 and L4-5. Aortic Atherosclerosis (ICD10-I70.0). Electronically Signed   By: Lowella Grip III M.D.   On: 02/19/2020 17:16   US ARTERIAL ABI (SCREENING LOWER EXTREMITY)  Result Date: 02/19/2020 CLINICAL DATA:  Right leg numbness. EXAM: NONINVASIVE PHYSIOLOGIC VASCULAR STUDY OF BILATERAL LOWER EXTREMITIES TECHNIQUE: Evaluation of both lower extremities were performed at rest, including calculation of ankle-brachial indices with single level Doppler, pressure and pulse volume recording. COMPARISON:  12/06/2016 FINDINGS: Right ABI: Could not be obtained due to lack of flow in the right ankle. Left ABI: 0.50 (previously 0.50) Right Lower Extremity: No arterial signal identified in the right dorsalis pedis artery or the right posterior tibial artery. Left Lower Extremity:  Monophasic waveforms in the left ankle. IMPRESSION: 1. No arterial flow identified in the right ankle. 2. Left ABI is stable, measuring 0.50. Findings are  compatible with severe peripheral vascular disease in left lower extremity. These results were called by telephone at the time of interpretation on 02/19/2020 at 5:12 pm to provider [facility] , who verbally acknowledged these results. Electronically Signed   By: Markus Daft M.D.   On: 02/19/2020 17:14    Scheduled Meds: . amLODipine  10 mg Oral Daily  . aspirin EC  81 mg Oral Daily  . carvedilol  25 mg Oral BID  . hydrALAZINE  25 mg Oral Q8H  . insulin aspart  0-15 Units Subcutaneous Q4H  . insulin detemir  12 Units Subcutaneous BID  . pantoprazole  40 mg Oral BID AC  . rosuvastatin  10 mg Oral QHS    Continuous Infusions: . heparin 1,250 Units/hr (02/19/20 1856)     LOS: 1 day     Annita Brod, MD [facility]   02/20/2020, 2:01 PM

## 2020-02-20 NOTE — Progress Notes (Signed)
ANTICOAGULATION CONSULT NOTE -   Pharmacy Consult for IV Heparin Indication: Critical PAD  Allergies  Allergen Reactions  . Lisinopril-Hydrochlorothiazide Hives and Itching  . Penicillins   . Sulfonamide Derivatives Other (See Comments)    Hives    Patient Measurements: Height: 5\' 4"  (162.6 cm) Weight: 102.1 kg (225 lb) IBW/kg (Calculated) : 54.7 Heparin Dosing Weight: 78.5 kg  Vital Signs: BP: 155/45 (01/06 0415) Pulse Rate: 71 (01/06 0415)  Labs: Recent Labs    02/19/20 1944 02/20/20 0147 02/20/20 0834  HGB 12.4 11.6*  --   HCT 39.4 38.0  --   PLT 372 272  --   HEPARINUNFRC  --  0.54 0.50  CREATININE 0.82 0.86  --     Estimated Creatinine Clearance: 71.8 mL/min (by C-G formula based on SCr of 0.86 mg/dL).   Medical History: Past Medical History:  Diagnosis Date  . CHF (congestive heart failure) (HCC)   . COPD (chronic obstructive pulmonary disease) (HCC)   . Diabetes mellitus without complication (HCC)   . Ganglion cyst   . Headache   . Heart murmur   . Hypertension   . Insulin pump in place   . Neuropathy in diabetes (HCC)   . Peripheral vascular disease (HCC)    a. prior stenting, right Fem-Pop bypass graft b. 04/2015: s/p PTA and stenting of R external iliac  . Respiratory failure (HCC)   . Shortness of breath dyspnea     Assessment: 70 yr old female presented to [facility] ED with bilateral leg numbness when sitting/lying, R worse than left; pulses were palpable in L foot, but unable to doppler dorsalis pedis in R foot. Pharmacy is consulted for heparin dosing for critical PAD. Per med rec, pt was on no anticoagulants PTA.  CBC WNL   VVS consulted, plan to transfer pt to [facility] with tentative plan for angiogram   HL 0.50- therapeutic  Goal of Therapy:  Heparin level 0.3-0.7 units/ml Monitor platelets by anticoagulation protocol: Yes   Plan:  Continue heparin infusion at 1250 units/hr Check heparin level daily Monitor  daily heparin level, CBC Monitor for signs/symptoms of bleeding  .[facility], PharmD Clinical Pharmacist 02/20/2020 9:19 AM

## 2020-02-21 ENCOUNTER — Encounter (HOSPITAL_COMMUNITY): Payer: Self-pay | Admitting: Emergency Medicine

## 2020-02-21 ENCOUNTER — Emergency Department (HOSPITAL_COMMUNITY): Payer: Medicare Other

## 2020-02-21 DIAGNOSIS — M5136 Other intervertebral disc degeneration, lumbar region: Secondary | ICD-10-CM | POA: Diagnosis not present

## 2020-02-21 DIAGNOSIS — R202 Paresthesia of skin: Secondary | ICD-10-CM

## 2020-02-21 DIAGNOSIS — I739 Peripheral vascular disease, unspecified: Secondary | ICD-10-CM

## 2020-02-21 DIAGNOSIS — M79604 Pain in right leg: Secondary | ICD-10-CM

## 2020-02-21 DIAGNOSIS — I5032 Chronic diastolic (congestive) heart failure: Secondary | ICD-10-CM | POA: Diagnosis not present

## 2020-02-21 DIAGNOSIS — J449 Chronic obstructive pulmonary disease, unspecified: Secondary | ICD-10-CM

## 2020-02-21 DIAGNOSIS — F17200 Nicotine dependence, unspecified, uncomplicated: Secondary | ICD-10-CM | POA: Diagnosis not present

## 2020-02-21 DIAGNOSIS — E782 Mixed hyperlipidemia: Secondary | ICD-10-CM

## 2020-02-21 DIAGNOSIS — R200 Anesthesia of skin: Secondary | ICD-10-CM

## 2020-02-21 DIAGNOSIS — M79671 Pain in right foot: Secondary | ICD-10-CM

## 2020-02-21 DIAGNOSIS — E669 Obesity, unspecified: Secondary | ICD-10-CM

## 2020-02-21 DIAGNOSIS — K219 Gastro-esophageal reflux disease without esophagitis: Secondary | ICD-10-CM

## 2020-02-21 LAB — BASIC METABOLIC PANEL
Anion gap: 10 (ref 5–15)
BUN: 12 mg/dL (ref 8–23)
CO2: 27 mmol/L (ref 22–32)
Calcium: 9 mg/dL (ref 8.9–10.3)
Chloride: 102 mmol/L (ref 98–111)
Creatinine, Ser: 0.81 mg/dL (ref 0.44–1.00)
GFR, Estimated: >60 mL/min (ref >60)
Glucose, Bld: 110 mg/dL — ABNORMAL HIGH (ref 70–99)
Potassium: 3.7 mmol/L (ref 3.5–5.1)
Sodium: 139 mmol/L (ref 135–145)

## 2020-02-21 LAB — HEPARIN LEVEL (UNFRACTIONATED)
Heparin Unfractionated: 0.75 IU/mL — ABNORMAL HIGH (ref 0.30–0.70)
Heparin Unfractionated: 0.63 IU/mL (ref 0.30–0.70)

## 2020-02-21 LAB — CBC
HCT: 39.9 % (ref 36.0–46.0)
Hemoglobin: 12.3 g/dL (ref 12.0–15.0)
MCH: 24.7 pg — ABNORMAL LOW (ref 26.0–34.0)
MCHC: 30.8 g/dL (ref 30.0–36.0)
MCV: 80.3 fL (ref 80.0–100.0)
Platelets: 340 10*3/uL (ref 150–400)
RBC: 4.97 MIL/uL (ref 3.87–5.11)
RDW: 15.3 % (ref 11.5–15.5)
WBC: 10.4 10*3/uL (ref 4.0–10.5)
nRBC: 0 % (ref 0.0–0.2)

## 2020-02-21 LAB — GLUCOSE, CAPILLARY
Glucose-Capillary: 109 mg/dL — ABNORMAL HIGH (ref 70–99)
Glucose-Capillary: 166 mg/dL — ABNORMAL HIGH (ref 70–99)

## 2020-02-21 LAB — CBG MONITORING, ED
Glucose-Capillary: 102 mg/dL — ABNORMAL HIGH (ref 70–99)
Glucose-Capillary: 137 mg/dL — ABNORMAL HIGH (ref 70–99)
Glucose-Capillary: 174 mg/dL — ABNORMAL HIGH (ref 70–99)

## 2020-02-21 MED ORDER — IOHEXOL 350 MG/ML SOLN
100.0000 mL | Freq: Once | INTRAVENOUS | Status: AC | PRN
  Administered 2020-02-21: 100 mL via INTRAVENOUS

## 2020-02-21 MED ORDER — NICOTINE 21 MG/24HR TD PT24
21.0000 mg | MEDICATED_PATCH | Freq: Every day | TRANSDERMAL | Status: DC
  Administered 2020-02-21 – 2020-02-27 (×6): 21 mg via TRANSDERMAL
  Filled 2020-02-21 (×6): qty 1

## 2020-02-21 MED ORDER — NICOTINE POLACRILEX 2 MG MT GUM
2.0000 mg | CHEWING_GUM | OROMUCOSAL | Status: DC | PRN
  Filled 2020-02-21: qty 1

## 2020-02-21 NOTE — ED Notes (Signed)
Patient repeatedly self removing herself from monitor after education that it needed to be connected for cardiac and vital sign monitoring.

## 2020-02-21 NOTE — Progress Notes (Signed)
ANTICOAGULATION CONSULT NOTE -   Pharmacy Consult for IV Heparin Indication: Critical PAD  Allergies  Allergen Reactions  . Lisinopril-Hydrochlorothiazide Hives and Itching  . Penicillins   . Sulfonamide Derivatives Other (See Comments)    Hives    Patient Measurements: Height: 5\' 4"  (162.6 cm) Weight: 102.1 kg (225 lb) IBW/kg (Calculated) : 54.7 Heparin Dosing Weight: 78.5 kg  Vital Signs: Temp: 98.7 F (37.1 C) (01/07 0500) Temp Source: Oral (01/07 0500) BP: 142/60 (01/07 0500) Pulse Rate: 67 (01/07 0500)  Labs: Recent Labs    02/19/20 1944 02/20/20 0147 02/20/20 0834 02/21/20 0512  HGB 12.4 11.6*  --   --   HCT 39.4 38.0  --   --   PLT 372 272  --   --   HEPARINUNFRC  --  0.54 0.50 0.75*  CREATININE 0.82 0.86  --   --     Estimated Creatinine Clearance: 71.8 mL/min (by C-G formula based on SCr of 0.86 mg/dL).   Assessment: 70 yr old female presented to [facility] ED with bilateral leg numbness when sitting/lying, R worse than left; pulses were palpable in L foot, but unable to doppler dorsalis pedis in R foot. Pharmacy is consulted for heparin dosing for critical PAD. Per med rec, pt was on no anticoagulants PTA.  Heparin level supratherapeutic (0.75) on gtt at 1250 units/hr. No bleeding noted.  Goal of Therapy:  Heparin level 0.3-0.7 units/ml Monitor platelets by anticoagulation protocol: Yes   Plan:  Decrease heparin infusion to 1150 units/hr Will f/u 6 hr heparin level  [facility], PharmD, BCPS Please see amion for complete clinical pharmacist phone list 02/21/2020 6:47 AM

## 2020-02-21 NOTE — ED Notes (Signed)
Report given to [place] RN @ [facility] ED

## 2020-02-21 NOTE — Progress Notes (Signed)
ANTICOAGULATION CONSULT NOTE -   Pharmacy Consult for IV Heparin Indication: Critical PAD  Allergies  Allergen Reactions  . Lisinopril-Hydrochlorothiazide Hives and Itching  . Penicillins   . Sulfonamide Derivatives Other (See Comments)    Hives    Patient Measurements: Height: 5\' 4"  (162.6 cm) Weight: 102.1 kg (225 lb) IBW/kg (Calculated) : 54.7 Heparin Dosing Weight: 78.5 kg  Vital Signs: Temp: 97.7 F (36.5 C) (01/07 1108) Temp Source: Oral (01/07 1108) BP: 159/37 (01/07 1415) Pulse Rate: 57 (01/07 1415)  Labs: Recent Labs    02/19/20 1944 02/19/20 1944 02/20/20 0147 02/20/20 0834 02/21/20 0512 02/21/20 0702 02/21/20 1320  HGB 12.4  --  11.6*  --   --  12.3  --   HCT 39.4  --  38.0  --   --  39.9  --   PLT 372  --  272  --   --  340  --   HEPARINUNFRC  --    < > 0.54 0.50 0.75*  --  0.63  CREATININE 0.82  --  0.86  --  0.81  --   --    < > = values in this interval not displayed.    Estimated Creatinine Clearance: 76.3 mL/min (by C-G formula based on SCr of 0.81 mg/dL).   Assessment: 70 yr old female presented to [facility] ED with bilateral leg numbness when sitting/lying, R worse than left; pulses were palpable in L foot, but unable to doppler dorsalis pedis in R foot. Pharmacy is consulted for heparin dosing for critical PAD. Per med rec, pt was on no anticoagulants PTA.  Heparin level therapeutic s/p rate decrease to 1150 units/hr, no issues noted  Goal of Therapy:  Heparin level 0.3-0.7 units/ml Monitor platelets by anticoagulation protocol: Yes   Plan:  Continue heparin gtt at 1150 units/hr F/u heparin level with AM labs Monitor VVS plans  [facility], PharmD Clinical Pharmacist ED Pharmacist Phone # [phone] 02/21/2020 3:03 PM

## 2020-02-21 NOTE — Progress Notes (Signed)
PROGRESS NOTE  Katherine Williams ZOX:[id number] DOB: 10/10/1950 DOA: 02/19/2020 PCP: Benita Stabile, MD   LOS: 2 days   Brief narrative: 70 year old female with past medical history of peripheral vascular disease, hypertension, diabetes mellitus and ongoing tobacco abuse  status post bilateral superficial femoral artery occlusion and previous right femoral-popliteal bypass with stent placement presented to the emergency room on 1/5 with complaints of right lower back pain radiating down her right lower leg and some increased numbness and tingling in her lower extremities right greater than left.  In the emergency room, she was found to be hypokalemic with a potassium of 2.6 and an ABI done showed no arterial flow in the right ankle while left ABI was stable at 0.5.  Case was discussed with vascular surgery at [facility] who recommended transfer for further work-up and treatment  Assessment/Plan:  Principal Problem:   PVD (peripheral vascular disease) with claudication (HCC) Active Problems:   Mixed hyperlipidemia   Current smoker   Essential hypertension   GERD   DM type 2 causing vascular disease (HCC)   Chronic diastolic heart failure (HCC)   Morbid obesity (HCC)  PVD (peripheral vascular disease) with claudication: spoke with vascular surgery at bedside. Will get CTA with run off. On heparin drip. . On aspirin, on oxycodone for pain. Continue statins, coreg.    Mixed hyperlipidemia. Continue statins    Current smoker: Counseling done extensively at bedside regarding tobacco cessation.  Will prescribe nicotine patch and gum.  COPD: Currently stable.  On room air. Continue nebulizers.  Compensated at this time.    Essential hypertension:  HCTZ and lisinopril on hold at this time. Continue amlodipine, coreg, hydralazine.  Closely monitor blood pressure    GERD: on PPI.    DM type 2. Continue sliding scale insulin, levemir, diabetic diet, acu checks.     Chronic diastolic heart  failure (HCC): Euvolemic at this time.  Continue to monitor closely.  Continue beta-blockers aspirin.    Morbid obesity: Would benefit from weight loss as outpatient  Hypokalemia: Replaced.  Check levels in a.m.  DVT prophylaxis: On heparin drip  Disposition: inpatient  Patient is inpatient due to need for further vascular work-up vascular surgery consultation possible intervention.  Planned Disposition: Home             Expected discharge date: 2 to 3 days             Medically stable for discharge: No   Code Status: Full code  Family Communication: None   Consultants:  Vascular surgery  Procedures:  None yet  Antibiotics:  . None  Anti-infectives (From admission, onward)   None     Subjective: Today, patient was seen and examined at bedside.  Complains of leg pain at rest.  Denies dyspnea, chest pain, nausea or vomiting.  Discussed about the smoking cessation  Objective: Vitals:   02/21/20 0823 02/21/20 1108  BP: (!) 167/66 (!) 162/60  Pulse: 67 62  Resp: 16 18  Temp:  97.7 F (36.5 C)  SpO2: 96% 96%   No intake or output data in the 24 hours ending 02/21/20 1333 Filed Weights   02/19/20 1529  Weight: 102.1 kg   Body mass index is 38.62 kg/m.   Physical Exam: GENERAL: Patient is alert awake and oriented. Not in obvious distress.  Obese, HENT: No scleral pallor or icterus. Pupils equally reactive to light. Oral mucosa is moist NECK: is supple, no gross swelling noted. CHEST:  Diminished breath  sounds bilaterally. CVS: S1 and S2 heard, no murmur. Regular rate and rhythm.  ABDOMEN: Soft, non-tender, bowel sounds are present. EXTREMITIES: Right foot cool compared to left.  Unable to palpate femoral pulses,  CNS: Cranial nerves are intact. No focal motor deficits. SKIN: warm and dry without rashes.  Data Review: I have personally reviewed the following laboratory data and studies,  CBC: Recent Labs  Lab 02/19/20 1944 02/20/20 0147  02/21/20 0702  WBC 10.4 10.2 10.4  HGB 12.4 11.6* 12.3  HCT 39.4 38.0 39.9  MCV 78.6* 80.7 80.3  PLT 372 272 340   Basic Metabolic Panel: Recent Labs  Lab 02/19/20 1944 02/20/20 0147 02/21/20 0512  NA 136 137 139  K 2.6* 2.9* 3.7  CL 94* 97* 102  CO2 31 30 27   GLUCOSE 164* 261* 110*  BUN 13 12 12   CREATININE 0.82 0.86 0.81  CALCIUM 9.1 8.5* 9.0  MG 1.8  --   --    Liver Function Tests: No results for input(s): AST, ALT, ALKPHOS, BILITOT, PROT, ALBUMIN in the last 168 hours. No results for input(s): LIPASE, AMYLASE in the last 168 hours. No results for input(s): AMMONIA in the last 168 hours. Cardiac Enzymes: No results for input(s): CKTOTAL, CKMB, CKMBINDEX, TROPONINI in the last 168 hours. BNP (last 3 results) No results for input(s): BNP in the last 8760 hours.  ProBNP (last 3 results) No results for input(s): PROBNP in the last 8760 hours.  CBG: Recent Labs  Lab 02/20/20 1602 02/20/20 1940 02/20/20 2353 02/21/20 0436 02/21/20 0807  GLUCAP 138* 132* 188* 102* 137*   Recent Results (from the past 240 hour(s))  Resp Panel by RT-PCR (Flu A&B, Covid) Nasopharyngeal Swab     Status: None   Collection Time: 02/19/20  9:37 PM   Specimen: Nasopharyngeal Swab; Nasopharyngeal(NP) swabs in vial transport medium  Result Value Ref Range Status   SARS Coronavirus 2 by RT PCR NEGATIVE NEGATIVE Final    Comment: (NOTE) SARS-CoV-2 target nucleic acids are NOT DETECTED.  The SARS-CoV-2 RNA is generally detectable in upper respiratory specimens during the acute phase of infection. The lowest concentration of SARS-CoV-2 viral copies this assay can detect is 138 copies/mL. A negative result does not preclude SARS-Cov-2 infection and should not be used as the sole basis for treatment or other patient management decisions. A negative result may occur with  improper specimen collection/handling, submission of specimen other than nasopharyngeal swab, presence of viral  mutation(s) within the areas targeted by this assay, and inadequate number of viral copies(<138 copies/mL). A negative result must be combined with clinical observations, patient history, and epidemiological information. The expected result is Negative.  Fact Sheet for Patients:  04/20/20  Fact Sheet for Healthcare Providers:  04/18/20  This test is no t yet approved or cleared by the [employer] FDA and  has been authorized for detection and/or diagnosis of SARS-CoV-2 by FDA under an Emergency Use Authorization (EUA). This EUA will remain  in effect (meaning this test can be used) for the duration of the COVID-19 declaration under Section 564(b)(1) of the Act, 21 U.S.C.section 360bbb-3(b)(1), unless the authorization is terminated  or revoked sooner.       Influenza A by PCR NEGATIVE NEGATIVE Final   Influenza B by PCR NEGATIVE NEGATIVE Final    Comment: (NOTE) The Xpert Xpress SARS-CoV-2/FLU/RSV plus assay is intended as an aid in the diagnosis of influenza from Nasopharyngeal swab specimens and should not be used as a sole basis for  treatment. Nasal washings and aspirates are unacceptable for Xpert Xpress SARS-CoV-2/FLU/RSV testing.  Fact Sheet for Patients: EntrepreneurPulse.com.au  Fact Sheet for Healthcare Providers: IncredibleEmployment.be  This test is not yet approved or cleared by the [place] FDA and has been authorized for detection and/or diagnosis of SARS-CoV-2 by FDA under an Emergency Use Authorization (EUA). This EUA will remain in effect (meaning this test can be used) for the duration of the COVID-19 declaration under Section 564(b)(1) of the Act, 21 U.S.C. section 360bbb-3(b)(1), unless the authorization is terminated or revoked.  Performed at [facility], [address], [place], [place] [address]      Studies: DG Lumbar Spine  Complete  Result Date: 02/19/2020 CLINICAL DATA:  Low back pain with lower extremity numbness EXAM: LUMBAR SPINE - COMPLETE 4+ VIEW COMPARISON:  None. FINDINGS: Frontal, lateral, spot lumbosacral lateral, and bilateral oblique views were obtained. There are 5 non-rib-bearing lumbar type vertebral bodies. There is no fracture or spondylolisthesis. There is moderately severe disc space narrowing at L5-S1 with milder disc space narrowing at L3-4 and L4-5. There is facet osteoarthritic change at L3-4, L4-5, and L5-S1 bilaterally. There is aortic atherosclerosis. There is iliac artery atherosclerosis with a stent in the right external iliac artery. IMPRESSION: No fracture or spondylolisthesis. 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 Disc space narrowing at L5-S1 and to a lesser degree at L3-4 and L4-5. Aortic Atherosclerosis (ICD10-I70.0). Electronically Signed   By: Lowella Grip III M.D.   On: 02/19/2020 17:16   US ARTERIAL ABI (SCREENING LOWER EXTREMITY)  Result Date: 02/19/2020 CLINICAL DATA:  Right leg numbness. EXAM: NONINVASIVE PHYSIOLOGIC VASCULAR STUDY OF BILATERAL LOWER EXTREMITIES TECHNIQUE: Evaluation of both lower extremities were performed at rest, including calculation of ankle-brachial indices with single level Doppler, pressure and pulse volume recording. COMPARISON:  12/06/2016 FINDINGS: Right ABI: Could not be obtained due to lack of flow in the right ankle. Left ABI: 0.50 (previously 0.50) Right Lower Extremity: No arterial signal identified in the right dorsalis pedis artery or the right posterior tibial artery. Left Lower Extremity:  Monophasic waveforms in the left ankle. IMPRESSION: 1. No arterial flow identified in the right ankle. 2. Left ABI is stable, measuring 0.50. Findings are compatible with severe peripheral vascular disease in left lower extremity. These results were called by telephone at the time of interpretation on 02/19/2020 at 5:12 pm to provider  [facility] , who verbally acknowledged these results. Electronically Signed   By: Markus Daft M.D.   On: 02/19/2020 17:14      Flora Lipps, MD  [facility] 02/21/2020  If 7PM-7AM, please contact night-coverage

## 2020-02-21 NOTE — Consult Note (Signed)
Hospital Consult    Reason for Consult: Peripheral vascular disease, right lower extremity Referring Physician: Jeani Hawking ED MRN #:  [id number]  History of Present Illness: This is a 70 y.o. female with history of hypertension, diabetes, obesity, COPD, tobacco abuse that presents as a transfer from [facility] for evaluation of PAD in the right lower extremity.  Patient has a complex history of vascular interventions and states her right foot has been hurting for years.  She states this got worse around [place].  She presented to [facility] where ABIs showed an ABI of 0 in the right foot and no flow at the ankle and vascular surgery was consulted.  They talked to Dr. Lenell Antu on the phone who recommended transfer to [facility] for further evaluation and that was 3 days ago.  She had a delay in transfer due to the Covid crisis and no beds available.  In review of the notes appears she had a left common femoral endarterectomy with a left common femoral to above-knee popliteal bypass in 2006 by Dr. Arbie Cookey.  Patient states she also had a right leg bypass that only lasted 1 day and then failed.  Most recently she had a right external iliac stent on 05/07/2015 by Dr. Allyson Sabal.  I did review his arteriogram pictures and appears she has a SFA occlusion at that time in 2017 as well.  She is able to move her foot.  She only complains of some numbness in the toes.  No tissue loss.  States she hardly walks due to back pain.  Large laparotomy incision from previous hysterectomy.  States she is still smoking 1/2 PPD.  Past Medical History:  Diagnosis Date  . CHF (congestive heart failure) (HCC)   . COPD (chronic obstructive pulmonary disease) (HCC)   . Diabetes mellitus without complication (HCC)   . Ganglion cyst   . Headache   . Heart murmur   . Hypertension   . Insulin pump in place   . Neuropathy in diabetes (HCC)   . Peripheral vascular disease (HCC)    a. prior stenting, right Fem-Pop bypass graft b.  04/2015: s/p PTA and stenting of R external iliac  . Respiratory failure (HCC)   . Shortness of breath dyspnea     Past Surgical History:  Procedure Laterality Date  . CARPAL TUNNEL RELEASE    . FEMORAL-FEMORAL BYPASS GRAFT     right and left legs  . ILIAC VEIN ANGIOPLASTY / STENTING Right 05/07/2015  . PERIPHERAL VASCULAR CATHETERIZATION N/A 05/07/2015   Procedure: Abdominal Aortogram;  Surgeon: Runell Gess, MD;  Location: [facility] INVASIVE CV LAB;  Service: Cardiovascular;  Laterality: N/A;  . PERIPHERAL VASCULAR CATHETERIZATION Bilateral 05/07/2015   Procedure: Lower Extremity Angiography;  Surgeon: Runell Gess, MD;  Location: [facility] INVASIVE CV LAB;  Service: Cardiovascular;  Laterality: Bilateral;  . PERIPHERAL VASCULAR CATHETERIZATION Right 05/07/2015   Procedure: Peripheral Vascular Intervention;  Surgeon: Runell Gess, MD;  Location: [facility] INVASIVE CV LAB;  Service: Cardiovascular;  Laterality: Right;  ext iliac    Allergies  Allergen Reactions  . Lisinopril-Hydrochlorothiazide Hives and Itching  . Penicillins   . Sulfonamide Derivatives Other (See Comments)    Hives    Prior to Admission medications   Medication Sig Start Date End Date Taking? Authorizing Provider  amLODipine (NORVASC) 10 MG tablet Take 1 tablet (10 mg total) by mouth daily. 02/26/15  Yes Vassie Loll, MD  aspirin EC 81 MG EC tablet Take 1 tablet (81  mg total) by mouth daily. 05/08/15  Yes Strader, [place] M, PA-C  carvedilol (COREG) 25 MG tablet Take 1 tablet (25 mg total) 2 (two) times daily by mouth. Patient taking differently: Take 25 mg by mouth 2 (two) times daily. 1 1/2 tabs BID 12/23/16 03/23/17 Yes Branch, Dorothe Pea, MD  hydrALAZINE (APRESOLINE) 25 MG tablet Take 1 tablet (25 mg total) by mouth every 8 (eight) hours. 02/26/15  Yes Vassie Loll, MD  insulin lispro (HUMALOG) 100 UNIT/ML injection Inject 111 Units into the skin. Via pump 111 units daily   Yes [provider]   ipratropium-albuterol (DUONEB) 0.5-2.5 (3) MG/3ML SOLN Inhale 3 mLs into the lungs every 6 (six) hours as needed (for shortness of breath).  10/25/16  Yes [provider]  losartan (COZAAR) 100 MG tablet Take 1 tablet (100 mg total) by mouth daily. 02/26/15  Yes Vassie Loll, MD  pantoprazole (PROTONIX) 40 MG tablet TAKE 1 TABLET BY MOUTH TWICE DAILY BEFORE A MEAL. Patient taking differently: Take 40 mg by mouth 2 (two) times daily before a meal. 06/28/17  Yes Setzer, Terri L, NP  rosuvastatin (CRESTOR) 10 MG tablet Take 10 mg by mouth at bedtime. 01/30/20  Yes [provider]  carvedilol (COREG) 12.5 MG tablet TAKE 1 & 1/2 TABLETS BY MOUTH TWICE DAILY 03/27/17   Antoine Poche, MD  hydrochlorothiazide (HYDRODIURIL) 25 MG tablet Take 25 mg by mouth daily. 01/30/20   [provider]    Social History   Socioeconomic History  . Marital status: Widowed    Spouse name: Not on file  . Number of children: Not on file  . Years of education: Not on file  . Highest education level: Not on file  Occupational History  . Not on file  Tobacco Use  . Smoking status: Current Every Day Smoker    Packs/day: 1.00    Years: 45.00    Pack years: 45.00    Types: Cigarettes    Start date: 06/29/1965  . Smokeless tobacco: Never Used  Vaping Use  . Vaping Use: Never used  Substance and Sexual Activity  . Alcohol use: Yes    Comment: occasionally  . Drug use: Yes    Types: Marijuana  . Sexual activity: Not on file  Other Topics Concern  . Not on file  Social History Narrative  . Not on file   Social Determinants of Health   Financial Resource Strain: Not on file  Food Insecurity: Not on file  Transportation Needs: Not on file  Physical Activity: Not on file  Stress: Not on file  Social Connections: Not on file  Intimate Partner Violence: Not on file     Family History  Problem Relation Age of Onset  . Congenital heart disease Mother   . Heart attack  Maternal Grandmother   . Heart disease Paternal Grandfather   . Diabetes Daughter     ROS: [x]  Positive   [ ]  Negative   [ ]  All sytems reviewed and are negative  Cardiovascular: []  chest pain/pressure []  palpitations []  SOB lying flat []  DOE []  pain in legs while walking [x]  pain in legs at rest (right) [x]  pain in legs at night (right) []  non-healing ulcers []  hx of DVT []  swelling in legs  Pulmonary: []  productive cough []  asthma/wheezing []  home O2  Neurologic: []  weakness in []  arms []  legs []  numbness in []  arms []  legs []  hx of CVA []  mini stroke [] difficulty speaking or slurred speech []   temporary loss of vision in one eye []  dizziness  Hematologic: []  hx of cancer []  bleeding problems []  problems with blood clotting easily  Endocrine:   []  diabetes []  thyroid disease  GI []  vomiting blood []  blood in stool  GU: []  CKD/renal failure []  HD--[]  M/W/F or []  T/T/S []  burning with urination []  blood in urine  Psychiatric: []  anxiety []  depression  Musculoskeletal: []  arthritis []  joint pain  Integumentary: []  rashes []  ulcers  Constitutional: []  fever []  chills   Physical Examination  Vitals:   02/21/20 0823 02/21/20 1108  BP: (!) 167/66 (!) 162/60  Pulse: 67 62  Resp: 16 18  Temp:  97.7 F (36.5 C)  SpO2: 96% 96%   Body mass index is 38.62 kg/m.  General:  NAD Gait: Not observed HENT: WNL, normocephalic Pulmonary: no respiratory distress Cardiac: regular, without  Murmurs, rubs or gallops Abdomen: soft, NT/ND, obese, large midline laparotomy incision Skin: without rashes Vascular Exam/Pulses: Difficulty appreciating femoral pulses in either groin She does have incisions in the above-knee popliteal region medially on both legs Left foot does have a PT and DP signal (monophasic) and is warm and motor sensory intact Unable to find signals in right foot but foot is warm and appears motor intact with some numbness in the  toes Extremities: Right foot appears viable  Musculoskeletal: no muscle wasting or atrophy  Neurologic: no appreciable deficits  CBC    Component Value Date/Time   WBC 10.4 02/21/2020 0702   RBC 4.97 02/21/2020 0702   HGB 12.3 02/21/2020 0702   HCT 39.9 02/21/2020 0702   PLT 340 02/21/2020 0702   MCV 80.3 02/21/2020 0702   MCH 24.7 (L) 02/21/2020 0702   MCHC 30.8 02/21/2020 0702   RDW 15.3 02/21/2020 0702   LYMPHSABS 1.3 10/25/2016 2348   MONOABS 0.8 10/25/2016 2348   EOSABS 0.3 10/25/2016 2348   BASOSABS 0.0 10/25/2016 2348    BMET    Component Value Date/Time   NA 139 02/21/2020 0512   K 3.7 02/21/2020 0512   CL 102 02/21/2020 0512   CO2 27 02/21/2020 0512   GLUCOSE 110 (H) 02/21/2020 0512   BUN 12 02/21/2020 0512   CREATININE 0.81 02/21/2020 0512   CREATININE 0.71 04/30/2015 1259   CALCIUM 9.0 02/21/2020 0512   GFRNONAA >60 02/21/2020 0512   GFRAA >60 10/25/2016 2348    COAGS: Lab Results  Component Value Date   INR 1.00 04/30/2015     Non-Invasive Vascular Imaging:    Patient had ABIs done on 02/19/2020 at [facility] that showed no arterial flow at the right ankle with a left ABI 0.5.   ASSESSMENT/PLAN: This is a 70 y.o. female with multiple medical problems including diabetes, tobacco abuse, COPD, HTN, obesity and multiple previous bilateral lower extremity revascularizations that presents as a transfer from [facility] for worsening right leg symptoms.    She states she has had a bypass in the right leg and although I do not have any operative notes she does have an incision in the region of the right above-knee popliteal space medially to suggest an old bypass.  She has had a right external iliac stent by Dr. Gwenlyn Found in 2017 as well.  I cannot find any Doppler flow at the right ankle but the foot is warm and motor intact and this appears to be chronically worsening PAD.  The foot appears viable at this time.  She describes several years of right foot rest pain.  I cannot appreciate any femoral pulses in either groin and this may be related to her obesity.  I have recommended a CTA abdomen pelvis with the runoff and have discussed with the hospitalist.  Vascular will continue to follow.  Cephus Shelling, MD [facility] Office: [phone]  Cephus Shelling

## 2020-02-21 NOTE — ED Triage Notes (Signed)
Pt BIB EMS from [facility].Hospital to hospital transfer. Pt c/o numbness when she is lying. Pt c/o R foot pain with no pedal pulse. Pt diagnosed with PAD.

## 2020-02-22 DIAGNOSIS — E1159 Type 2 diabetes mellitus with other circulatory complications: Secondary | ICD-10-CM | POA: Diagnosis not present

## 2020-02-22 DIAGNOSIS — I739 Peripheral vascular disease, unspecified: Secondary | ICD-10-CM | POA: Diagnosis not present

## 2020-02-22 DIAGNOSIS — I5032 Chronic diastolic (congestive) heart failure: Secondary | ICD-10-CM | POA: Diagnosis not present

## 2020-02-22 DIAGNOSIS — D509 Iron deficiency anemia, unspecified: Secondary | ICD-10-CM

## 2020-02-22 DIAGNOSIS — F17200 Nicotine dependence, unspecified, uncomplicated: Secondary | ICD-10-CM | POA: Diagnosis not present

## 2020-02-22 LAB — CBC
HCT: 37 % (ref 36.0–46.0)
Hemoglobin: 11.5 g/dL — ABNORMAL LOW (ref 12.0–15.0)
MCH: 24.7 pg — ABNORMAL LOW (ref 26.0–34.0)
MCHC: 31.1 g/dL (ref 30.0–36.0)
MCV: 79.4 fL — ABNORMAL LOW (ref 80.0–100.0)
Platelets: 315 10*3/uL (ref 150–400)
RBC: 4.66 MIL/uL (ref 3.87–5.11)
RDW: 14.9 % (ref 11.5–15.5)
WBC: 9.4 10*3/uL (ref 4.0–10.5)
nRBC: 0 % (ref 0.0–0.2)

## 2020-02-22 LAB — HEPARIN LEVEL (UNFRACTIONATED): Heparin Unfractionated: 0.35 IU/mL (ref 0.30–0.70)

## 2020-02-22 LAB — BASIC METABOLIC PANEL
Anion gap: 10 (ref 5–15)
BUN: 9 mg/dL (ref 8–23)
CO2: 26 mmol/L (ref 22–32)
Calcium: 8.9 mg/dL (ref 8.9–10.3)
Chloride: 102 mmol/L (ref 98–111)
Creatinine, Ser: 0.81 mg/dL (ref 0.44–1.00)
GFR, Estimated: >60 mL/min (ref >60)
Glucose, Bld: 136 mg/dL — ABNORMAL HIGH (ref 70–99)
Potassium: 3.6 mmol/L (ref 3.5–5.1)
Sodium: 138 mmol/L (ref 135–145)

## 2020-02-22 LAB — MAGNESIUM: Magnesium: 1.9 mg/dL (ref 1.7–2.4)

## 2020-02-22 LAB — PHOSPHORUS: Phosphorus: 2.9 mg/dL (ref 2.5–4.6)

## 2020-02-22 LAB — GLUCOSE, CAPILLARY
Glucose-Capillary: 130 mg/dL — ABNORMAL HIGH (ref 70–99)
Glucose-Capillary: 131 mg/dL — ABNORMAL HIGH (ref 70–99)
Glucose-Capillary: 142 mg/dL — ABNORMAL HIGH (ref 70–99)
Glucose-Capillary: 166 mg/dL — ABNORMAL HIGH (ref 70–99)
Glucose-Capillary: 118 mg/dL — ABNORMAL HIGH (ref 70–99)

## 2020-02-22 MED ORDER — BUPROPION HCL ER (SR) 150 MG PO TB12
150.0000 mg | ORAL_TABLET | Freq: Every day | ORAL | Status: DC
  Administered 2020-02-22 – 2020-02-23 (×2): 150 mg via ORAL
  Filled 2020-02-22 (×2): qty 1

## 2020-02-22 MED ORDER — POTASSIUM CHLORIDE 20 MEQ PO PACK
20.0000 meq | PACK | Freq: Once | ORAL | Status: AC
  Administered 2020-02-22: 20 meq via ORAL
  Filled 2020-02-22: qty 1

## 2020-02-22 MED ORDER — BUPROPION HCL ER (SR) 150 MG PO TB12
150.0000 mg | ORAL_TABLET | Freq: Two times a day (BID) | ORAL | Status: DC
  Administered 2020-02-25 – 2020-02-27 (×5): 150 mg via ORAL
  Filled 2020-02-22 (×5): qty 1

## 2020-02-22 NOTE — Plan of Care (Signed)

## 2020-02-22 NOTE — Progress Notes (Signed)
[facility]  Subjective  -states the pain in her right foot is about the same on heparin.  Still able to wiggle her toes.   Objective (!) 137/49 71 98.1 F (36.7 C) (Oral) 18 95%  Intake/Output Summary (Last 24 hours) at 02/22/2020 1053 Last data filed at 02/22/2020 0900 Gross per 24 hour  Intake 1200.65 ml  Output 800 ml  Net 400.65 ml    Right foot still appears viable and is motor intact and warm  Laboratory Lab Results: Recent Labs    02/21/20 0702 02/22/20 0216  WBC 10.4 9.4  HGB 12.3 11.5*  HCT 39.9 37.0  PLT 340 315   BMET Recent Labs    02/21/20 0512 02/22/20 0216  NA 139 138  K 3.7 3.6  CL 102 102  CO2 27 26  GLUCOSE 110* 136*  BUN 12 9  CREATININE 0.81 0.81  CALCIUM 9.0 8.9    COAG Lab Results  Component Value Date   INR 1.00 04/30/2015   No results found for: PTT  Assessment/Planning:  70 year old female with multiple medical issues as previously noted.  After review of CTA she has occluded right external iliac artery stent and also an occluded common femoral artery.  This is in the same extremity as a previous failed common femoral to above-knee popliteal bypass.  I discussed proceeding to the operating room on Monday for a right common femoral endarterectomy and likely iliac stenting procedure through a hybrid approach.  Discussed she will be very high risk for complications particularly given her obesity and I worry about groin breakdown.  She seems understanding and wishes to proceed.  Can continue heparin from my standpoint.  Discussed with her given multiple failed interventions she will be at high risk for limb loss even with intervention and she has continued to use tobacco heavily.  Cephus Shelling 02/22/2020 10:53 AM --

## 2020-02-22 NOTE — Progress Notes (Signed)
ANTICOAGULATION CONSULT NOTE -   Pharmacy Consult for IV Heparin Indication: Critical PAD  Allergies  Allergen Reactions  . Lisinopril-Hydrochlorothiazide Hives and Itching  . Penicillins   . Sulfonamide Derivatives Other (See Comments)    Hives    Patient Measurements: Height: 5\' 4"  (162.6 cm) Weight: 102.1 kg (225 lb) IBW/kg (Calculated) : 54.7 Heparin Dosing Weight: 78.5 kg  Vital Signs: Temp: 98.8 F (37.1 C) (01/08 0445) BP: 142/63 (01/08 0445) Pulse Rate: 66 (01/08 0445)  Labs: Recent Labs    02/20/20 0147 02/20/20 0834 02/21/20 0512 02/21/20 0702 02/21/20 1320 02/22/20 0216  HGB 11.6*  --   --  12.3  --  11.5*  HCT 38.0  --   --  39.9  --  37.0  PLT 272  --   --  340  --  315  HEPARINUNFRC 0.54   < > 0.75*  --  0.63 0.35  CREATININE 0.86  --  0.81  --   --  0.81   < > = values in this interval not displayed.    Estimated Creatinine Clearance: 76.3 mL/min (by C-G formula based on SCr of 0.81 mg/dL).   Assessment: 70 yr old female presented to [facility] ED with bilateral leg numbness when sitting/lying, R worse than left; pulses were palpable in L foot, but unable to doppler dorsalis pedis in R foot. Pharmacy is consulted for heparin dosing for critical PAD. Per med rec, pt was on no anticoagulants PTA.  Heparin level remains therapeutic s/p rate decrease to 1150 units/hr, no issues noted. Will continue.   Goal of Therapy:  Heparin level 0.3-0.7 units/ml Monitor platelets by anticoagulation protocol: Yes   Plan:  Continue heparin gtt at 1150 units/hr F/u heparin level with AM labs Monitor VVS plans  [facility], PharmD PGY2 ID Pharmacy Resident Phone between 7 am - 3:30 pm: Margarite Gouge  Please check AMION for all [facility] Pharmacy phone numbers After 10:00 PM, call Main Pharmacy [phone]  02/22/2020 7:43 AM

## 2020-02-22 NOTE — Plan of Care (Signed)
  Problem: Pain Managment: Goal: General experience of comfort will improve Outcome: Progressing   Problem: Safety: Goal: Ability to remain free from injury will improve Outcome: Progressing   Problem: Skin Integrity: Goal: Risk for impaired skin integrity will decrease Outcome: Progressing   

## 2020-02-22 NOTE — Progress Notes (Signed)
PROGRESS NOTE  Katherine GinsbergKathy M Williams ZOX:[id number] DOB: 08-03-1950 DOA: 02/19/2020 PCP: Benita StabileHall, John Z, MD   LOS: 3 days   Brief narrative: 70 year old female with past medical history of peripheral vascular disease, hypertension, diabetes mellitus and ongoing tobacco abuse  status post bilateral superficial femoral artery occlusion and previous right femoral-popliteal bypass with stent placement presented to the emergency room on 1/5 with complaints of right lower back pain radiating down her right lower leg and some increased numbness and tingling in her lower extremities right greater than left.  In the emergency room, she was found to be hypokalemic with a potassium of 2.6 and an ABI done showed no arterial flow in the right ankle while left ABI was stable at 0.5.[facility] where she was found to have occluded right external iliac stent and common femoral artery. Plan for hybrid procedure with enterectomy of R common femoral and iliac stenting on 1/10.   Assessment/Plan:  Principal Problem:   PVD (peripheral vascular disease) with claudication (HCC) Active Problems:   Mixed hyperlipidemia   Current smoker   Essential hypertension   GERD   DM type 2 causing vascular disease (HCC)   Chronic diastolic heart failure (HCC)   Morbid obesity (HCC)  PVD (peripheral vascular disease) with claudication: s - Vascular surgery on board, appreciate recommendations - OR on Monday 1/10 for hybrid procedure - Continue heparin - Tobacco cessation as below  Mixed hyperlipidemia  - Continue statin could increase to 20 mg of rosuvastatin   Tobacco use disorder/ Current Smoker  Discussed. She has tried Chantix and had adverse side effect. Very interested in other options. Bupropion 150 mg daily x 3 days then BID.  No history of seizures. Patches  And gum ordered.   COPD: Currently stable.  On room air. Continue nebulizers.  Compensated at this time.  Essential hypertension:  HCTZ and lisinopril on  hold at this time. Continue amlodipine, coreg, hydralazine.  Closely monitor blood pressure  GERD: on PPI.  DM type 2. Continue sliding scale insulin, levemir, diabetic diet, acu checks.   Chronic diastolic heart failure (HCC): Euvolemic at this time.  Continue to monitor closely.  Continue beta-blockers aspirin.  Morbid obesity: Would benefit from weight loss as outpatient  Microcytic anemic- check ferritin, may need outpatient colonoscopy.   Hypokalemia- repleted.   DVT prophylaxis: On heparin drip  Disposition: inpatient  Patient is inpatient due to need for further vascular work-up vascular surgery consultation possible intervention.  Planned Disposition: Home             Expected discharge date: 2 to 3 days             Medically stable for discharge: No   Code Status: Full code  Family Communication: None   Consultants:  Vascular surgery  Procedures:  None yet  Antibiotics:  . None  Anti-infectives (From admission, onward)   None     Subjective: Today, patient was seen and examined at bedside. Reports leg pain is okay. Denies worsening pain. No chest pain, dyspnea. Very much wants to stop smoking.   Objective: Vitals:   02/22/20 0846 02/22/20 0958  BP: (!) 159/59 (!) 137/49  Pulse: 69 71  Resp: 17 18  Temp: 98.3 F (36.8 C) 98.1 F (36.7 C)  SpO2: 94% 95%    Intake/Output Summary (Last 24 hours) at 02/22/2020 1402 Last data filed at 02/22/2020 0900 Gross per 24 hour  Intake 1200.65 ml  Output 800 ml  Net 400.65 ml  Filed Weights   02/19/20 1529  Weight: 102.1 kg   Body mass index is 38.62 kg/m.   Physical Exam: GENERAL: Patient is alert awake and oriented. Not in obvious distress.  Obese, HENT: No scleral pallor or icterus. Pupils equally reactive to light. Oral mucosa is moist NECK: is supple, no gross swelling noted. CHEST:  Diminished breath sounds bilaterally. CVS: S1 and S2 heard, no murmur. Regular rate and rhythm.  ABDOMEN:  Soft, non-tender, bowel sounds are present. EXTREMITIES: Right foot cool compared to left.  Unable to palpate femoral pulses, slightly hyperpigmented digits 4/5 as compared to L. Stable per patient.  CNS: Cranial nerves are intact. No focal motor deficits. SKIN: warm and dry without rashes.  Data Review: I have personally reviewed the following laboratory data and studies,  CBC: Recent Labs  Lab 02/19/20 1944 02/20/20 0147 02/21/20 0702 02/22/20 0216  WBC 10.4 10.2 10.4 9.4  HGB 12.4 11.6* 12.3 11.5*  HCT 39.4 38.0 39.9 37.0  MCV 78.6* 80.7 80.3 79.4*  PLT 372 272 340 315   Basic Metabolic Panel: Recent Labs  Lab 02/19/20 1944 02/20/20 0147 02/21/20 0512 02/22/20 0216  NA 136 137 139 138  K 2.6* 2.9* 3.7 3.6  CL 94* 97* 102 102  CO2 31 30 27 26   GLUCOSE 164* 261* 110* 136*  BUN 13 12 12 9   CREATININE 0.82 0.86 0.81 0.81  CALCIUM 9.1 8.5* 9.0 8.9  MG 1.8  --   --  1.9  PHOS  --   --   --  2.9   Liver Function Tests: No results for input(s): AST, ALT, ALKPHOS, BILITOT, PROT, ALBUMIN in the last 168 hours. No results for input(s): LIPASE, AMYLASE in the last 168 hours. No results for input(s): AMMONIA in the last 168 hours. Cardiac Enzymes: No results for input(s): CKTOTAL, CKMB, CKMBINDEX, TROPONINI in the last 168 hours. BNP (last 3 results) No results for input(s): BNP in the last 8760 hours.  ProBNP (last 3 results) No results for input(s): PROBNP in the last 8760 hours.  CBG: Recent Labs  Lab 02/21/20 2035 02/21/20 2357 02/22/20 0443 02/22/20 0853 02/22/20 1202  GLUCAP 166* 109* 130* 131* 142*   Recent Results (from the past 240 hour(s))  Resp Panel by RT-PCR (Flu A&B, Covid) Nasopharyngeal Swab     Status: None   Collection Time: 02/19/20  9:37 PM   Specimen: Nasopharyngeal Swab; Nasopharyngeal(NP) swabs in vial transport medium  Result Value Ref Range Status   SARS Coronavirus 2 by RT PCR NEGATIVE NEGATIVE Final    Comment: (NOTE) SARS-CoV-2  target nucleic acids are NOT DETECTED.  The SARS-CoV-2 RNA is generally detectable in upper respiratory specimens during the acute phase of infection. The lowest concentration of SARS-CoV-2 viral copies this assay can detect is 138 copies/mL. A negative result does not preclude SARS-Cov-2 infection and should not be used as the sole basis for treatment or other patient management decisions. A negative result may occur with  improper specimen collection/handling, submission of specimen other than nasopharyngeal swab, presence of viral mutation(s) within the areas targeted by this assay, and inadequate number of viral copies(<138 copies/mL). A negative result must be combined with clinical observations, patient history, and epidemiological information. The expected result is Negative.  Fact Sheet for Patients:  04/21/20  Fact Sheet for Healthcare Providers:  04/18/20  This test is no t yet approved or cleared by the [employer] FDA and  has been authorized for detection and/or diagnosis of SARS-CoV-2 by  FDA under an Emergency Use Authorization (EUA). This EUA will remain  in effect (meaning this test can be used) for the duration of the COVID-19 declaration under Section 564(b)(1) of the Act, 21 U.S.C.section 360bbb-3(b)(1), unless the authorization is terminated  or revoked sooner.       Influenza A by PCR NEGATIVE NEGATIVE Final   Influenza B by PCR NEGATIVE NEGATIVE Final    Comment: (NOTE) The Xpert Xpress SARS-CoV-2/FLU/RSV plus assay is intended as an aid in the diagnosis of influenza from Nasopharyngeal swab specimens and should not be used as a sole basis for treatment. Nasal washings and aspirates are unacceptable for Xpert Xpress SARS-CoV-2/FLU/RSV testing.  Fact Sheet for Patients: BloggerCourse.com  Fact Sheet for Healthcare  Providers: SeriousBroker.it  This test is not yet approved or cleared by the [place] FDA and has been authorized for detection and/or diagnosis of SARS-CoV-2 by FDA under an Emergency Use Authorization (EUA). This EUA will remain in effect (meaning this test can be used) for the duration of the COVID-19 declaration under Section 564(b)(1) of the Act, 21 U.S.C. section 360bbb-3(b)(1), unless the authorization is terminated or revoked.  Performed at [facility], [address], [place], [place] [address]      Studies: CT ANGIO AO+BIFEM W & OR WO CONTRAST  Result Date: 02/21/2020 CLINICAL DATA:  70 year old female with right foot pain and decreased pulses EXAM: CT ANGIOGRAPHY OF ABDOMINAL AORTA WITH ILIOFEMORAL RUNOFF TECHNIQUE: Multidetector CT imaging of the abdomen, pelvis and lower extremities was performed using the standard protocol during bolus administration of intravenous contrast. Multiplanar CT image reconstructions and MIPs were obtained to evaluate the vascular anatomy. CONTRAST:  OMNIPAQUE IOHEXOL 350 MG/ML SOLN COMPARISON:  Ankle-brachial indices obtained earlier today FINDINGS: VASCULAR Aorta: Small caliber aorta with atherosclerotic calcifications throughout. No evidence of aneurysm or dissection. 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 Sys likely chronic in nature given the relative atrophy of the celiac axis and proximal hepatic and splenic arteries. Hypertrophy of the pancreaticoduodenal arcade is present. SMA: Patent without evidence of aneurysm, dissection, vasculitis or significant stenosis. Renals: Single renal arteries bilaterally. Heavily calcified atherosclerotic plaque results in moderate to advanced stenosis of the proximal left renal artery. The right renal artery is also diseased with perhaps mild to moderate stenosis. IMA: Occluded at the origin. RIGHT Lower Extremity Inflow: Scattered calcified atherosclerotic plaque  throughout the common iliac artery with at least moderate focal stenosis. The internal iliac artery is heavily diseased and likely stenotic at the origin. The most proximal portion of the external iliac artery is patent. However, the previously placed external iliac artery stent is completely occluded. 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 The profunda femoral branches reconstitute via collateral flow. The previously placed femoral to above the knee popliteal bypass graft is completely occluded and collapsed. The native popliteal artery reconstitutes via collateral flow. Runoff: Diseased but patent 3 vessel runoff to the ankle. LEFT Lower Extremity Inflow: Heavily calcified atherosclerotic plaque throughout the common iliac artery with at least moderate focal stenosis. The internal iliac artery remains patent. The external iliac artery is diseased but patent and without significant stenosis. Outflow: Surgical changes of prior left common femoral endarterectomy and femoral to above the knee bypass graft. The bypass graft is completely thrombosed. The profunda femoral artery remains patent. Popliteal artery reconstitutes via collateral flow. Runoff: Diseased below the knee arteries. The anterior tibial artery occludes just above the ankle. There appears to be peroneal continuation of the dorsalis  pedis artery. The posterior tibial artery remains patent to the ankle. Veins: No obvious venous abnormality within the limitations of this arterial phase study. Review of the MIP images confirms the above findings. NON-VASCULAR Lower chest: No acute abnormality. Hepatobiliary: No focal liver abnormality is seen. No gallstones, gallbladder wall thickening, or biliary dilatation. Pancreas: Unremarkable. No pancreatic ductal dilatation or surrounding inflammatory changes. Spleen: Normal in size without focal abnormality. Adrenals/Urinary Tract: Adrenal glands are unremarkable. No hydronephrosis  or nephrolithiasis. Slight renal size discrepancy with the left kidney smaller than the right consistent with chronic ischemia. No enhancing renal mass. Tiny low-attenuation renal lesions are too small to characterize but statistically likely benign cysts. Stomach/Bowel: Stomach is within normal limits. Appendix appears normal. No evidence of bowel wall thickening, distention, or inflammatory changes. Lymphatic: No suspicious lymphadenopathy. Reproductive: Status post hysterectomy. No adnexal masses. Other: No abdominal wall hernia or abnormality. No abdominopelvic ascites. Musculoskeletal: No acute fracture or aggressive appearing lytic or blastic osseous lesion. Lower lumbar degenerative disc disease. Minimal grade 1 anterolisthesis of L4 on L5. IMPRESSION: VASCULAR 1. Severe right lower extremity peripheral arterial disease with at least moderate stenosis of the common iliac artery and total occlusion of the external iliac and common femoral arteries as well as the femoral to above the knee popliteal bypass graft. The popliteal and runoff arteries reconstitute via collateral flow. 2. Severe left lower extremity peripheral arterial disease with at least moderate stenosis of the common iliac artery and chronic occlusion of the femoral to above the knee popliteal bypass graft. 3. Probable peroneal continuation of the distal anterior tibial artery on the left. 4. 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 5. High-grade stenosis of the proximal left renal artery. 6. Moderate stenosis of the mid right renal artery. 7. Aortic Atherosclerosis (ICD10-I70.0). NON-VASCULAR 1. No acute abnormality within the abdomen or pelvis. 2. Mild atrophy and delayed nephrogram of the left kidney consistent with chronic ischemia. 3. Lower lumbar degenerative disc disease. Signed, Sterling Big, MD, RPVI [facility]  Special[facility]iology Electronically Signed   By: Malachy Moan M.D.   On: 02/21/2020 16:31      Shekia Kuper Bartholome Bill, MD  [facility] 02/22/2020  If 7PM-7AM, please contact night-coverage

## 2020-02-23 DIAGNOSIS — I739 Peripheral vascular disease, unspecified: Secondary | ICD-10-CM | POA: Diagnosis not present

## 2020-02-23 LAB — GLUCOSE, CAPILLARY
Glucose-Capillary: 118 mg/dL — ABNORMAL HIGH (ref 70–99)
Glucose-Capillary: 123 mg/dL — ABNORMAL HIGH (ref 70–99)
Glucose-Capillary: 130 mg/dL — ABNORMAL HIGH (ref 70–99)
Glucose-Capillary: 132 mg/dL — ABNORMAL HIGH (ref 70–99)
Glucose-Capillary: 175 mg/dL — ABNORMAL HIGH (ref 70–99)
Glucose-Capillary: 187 mg/dL — ABNORMAL HIGH (ref 70–99)
Glucose-Capillary: 211 mg/dL — ABNORMAL HIGH (ref 70–99)

## 2020-02-23 LAB — CBC
HCT: 36.1 % (ref 36.0–46.0)
Hemoglobin: 11 g/dL — ABNORMAL LOW (ref 12.0–15.0)
MCH: 24.1 pg — ABNORMAL LOW (ref 26.0–34.0)
MCHC: 30.5 g/dL (ref 30.0–36.0)
MCV: 79.2 fL — ABNORMAL LOW (ref 80.0–100.0)
Platelets: 333 10*3/uL (ref 150–400)
RBC: 4.56 MIL/uL (ref 3.87–5.11)
RDW: 14.9 % (ref 11.5–15.5)
WBC: 8.8 10*3/uL (ref 4.0–10.5)
nRBC: 0 % (ref 0.0–0.2)

## 2020-02-23 LAB — HEPARIN LEVEL (UNFRACTIONATED): Heparin Unfractionated: 0.46 IU/mL (ref 0.30–0.70)

## 2020-02-23 LAB — FERRITIN: Ferritin: 135 ng/mL (ref 11–307)

## 2020-02-23 LAB — COMPREHENSIVE METABOLIC PANEL
ALT: 21 U/L (ref 0–44)
AST: 25 U/L (ref 15–41)
Albumin: 2.9 g/dL — ABNORMAL LOW (ref 3.5–5.0)
Alkaline Phosphatase: 43 U/L (ref 38–126)
Anion gap: 10 (ref 5–15)
BUN: 10 mg/dL (ref 8–23)
CO2: 26 mmol/L (ref 22–32)
Calcium: 8.9 mg/dL (ref 8.9–10.3)
Chloride: 101 mmol/L (ref 98–111)
Creatinine, Ser: 0.94 mg/dL (ref 0.44–1.00)
GFR, Estimated: >60 mL/min (ref >60)
Glucose, Bld: 157 mg/dL — ABNORMAL HIGH (ref 70–99)
Potassium: 3.5 mmol/L (ref 3.5–5.1)
Sodium: 137 mmol/L (ref 135–145)
Total Bilirubin: 0.6 mg/dL (ref 0.3–1.2)
Total Protein: 6.4 g/dL — ABNORMAL LOW (ref 6.5–8.1)

## 2020-02-23 MED ORDER — VANCOMYCIN HCL IN DEXTROSE 1-5 GM/200ML-% IV SOLN
1000.0000 mg | INTRAVENOUS | Status: AC
  Administered 2020-02-24: 1000 mg via INTRAVENOUS
  Filled 2020-02-23 (×2): qty 200

## 2020-02-23 NOTE — Progress Notes (Signed)
PROGRESS NOTE    Katherine Williams  SPQ:[id number]  DOB: [birth year]  DOA: 02/19/2020 PCP: Benita Stabile, MD Outpatient Specialists:   Hospital course: 70 year old female with PMH significant for PVD, HTN, DM 2 on insulin pump and ongoing tobacco use is status post bilateral superficial femoropopliteal bypass with stent placement was readmitted on 02/19/2020 due to numbness and tingling in her right lower extremity.  Work-up revealed no flow at the right ankle and she was found to have occluded right external iliac stent and common femoral artery.  Seen by vascular surgery and plan is for hybrid procedure with endarterectomy of right common femoral and iliac stenting on 02/24/2020.   Subjective:  Patient states that she is quite scared.  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  She is also worried about breakdown of incision given her pannus.  States she discussed this with vascular surgery.  States she is trying to quit smoking but it has been very hard.  Notes that she is on bupropion and has a nicotine patch on.  States she misses her little [place] dog and is looking forward to seeing him when she goes home.  States that she is worried about her glucometer at home.  Notes that her freestyle sensor and glucometer and pump all show different glucose readings varying up to 20 points.   Objective: Vitals:   02/22/20 2057 02/23/20 0405 02/23/20 0818 02/23/20 1237  BP: (!) 160/51 (!) 151/59 (!) 153/91 (!) 148/56  Pulse: 61 (!) 55 (!) 59 63  Resp: 16 17 18 18   Temp: 98.8 F (37.1 C) 98.7 F (37.1 C) 97.9 F (36.6 C) 98 F (36.7 C)  TempSrc: Oral Oral    SpO2: 99% 95% 97% 97%  Weight:      Height:        Intake/Output Summary (Last 24 hours) at 02/23/2020 1543 Last data filed at 02/23/2020 1400 Gross per 24 hour  Intake 896.04 ml  Output -  Net 896.04 ml   Filed Weights   02/19/20 1529  Weight: 102.1 kg     Exam:  General: Friendly, chatty, relatively  well-appearing female sitting up in bed with legs hanging down eating breakfast. Eyes: sclera anicteric, conjuctiva mild injection bilaterally CVS: S1-S2, regular  Respiratory:  decreased air entry bilaterally secondary to decreased inspiratory effort, rales at bases  GI: NABS, soft, NT  LE: Right foot is cool compared to the left. Neuro: A/O x 3, Moving all extremities equally with normal strength, CN 3-12 intact, grossly nonfocal.  Psych: patient is logical and coherent, judgement and insight appear normal, mood and affect appropriate to situation.   Assessment & Plan:   70 year old female with PVD and DM2 with ongoing tobacco use is admitted for right lower extremity ischemia.  PVD For vascular surgery tomorrow morning On heparin drip  Ongoing tobacco use On bupropion and nicotine patch, also as needed nicotine gum States she is trying very hard to quit smoking  DM2 Blood sugar under excellent control on present regimen We will ask diabetes coordinator for consultation regarding glucometer readings at home  HTN Continue amlodipine, carvedilol, hydralazine HCTZ and lisinopril are on hold  HFpEF No evidence for decompensation  HL Continue statin   DVT prophylaxis: Heparin drip Code Status: Full Family Communication: Patient is in contact with her sister she states no need to call Disposition Plan:   Patient is from: Home  Anticipated Discharge Location: Home  Barriers to Discharge: For vascular  procedure tomorrow  Is patient medically stable for Discharge: No   Consultants:  Vascular surgery  Procedures:  None yet, for surgery tomorrow  Antimicrobials:  None   Data Reviewed:  Basic Metabolic Panel: Recent Labs  Lab 02/19/20 1944 02/20/20 0147 02/21/20 0512 02/22/20 0216 02/23/20 0228  NA 136 137 139 138 137  K 2.6* 2.9* 3.7 3.6 3.5  CL 94* 97* 102 102 101  CO2 31 30 27 26 26   GLUCOSE 164* 261* 110* 136* 157*  BUN 13 12 12 9 10   CREATININE  0.82 0.86 0.81 0.81 0.94  CALCIUM 9.1 8.5* 9.0 8.9 8.9  MG 1.8  --   --  1.9  --   PHOS  --   --   --  2.9  --    Liver Function Tests: Recent Labs  Lab 02/23/20 0228  AST 25  ALT 21  ALKPHOS 43  BILITOT 0.6  PROT 6.4*  ALBUMIN 2.9*   No results for input(s): LIPASE, AMYLASE in the last 168 hours. No results for input(s): AMMONIA in the last 168 hours. CBC: Recent Labs  Lab 02/19/20 1944 02/20/20 0147 02/21/20 0702 02/22/20 0216 02/23/20 0228  WBC 10.4 10.2 10.4 9.4 8.8  HGB 12.4 11.6* 12.3 11.5* 11.0*  HCT 39.4 38.0 39.9 37.0 36.1  MCV 78.6* 80.7 80.3 79.4* 79.2*  PLT 372 272 340 315 333   Cardiac Enzymes: No results for input(s): CKTOTAL, CKMB, CKMBINDEX, TROPONINI in the last 168 hours. BNP (last 3 results) No results for input(s): PROBNP in the last 8760 hours. CBG: Recent Labs  Lab 02/22/20 2022 02/23/20 0005 02/23/20 0402 02/23/20 0933 02/23/20 1234  GLUCAP 118* 187* 132* 130* 175*    Recent Results (from the past 240 hour(s))  Resp Panel by RT-PCR (Flu A&B, Covid) Nasopharyngeal Swab     Status: None   Collection Time: 02/19/20  9:37 PM   Specimen: Nasopharyngeal Swab; Nasopharyngeal(NP) swabs in vial transport medium  Result Value Ref Range Status   SARS Coronavirus 2 by RT PCR NEGATIVE NEGATIVE Final    Comment: (NOTE) SARS-CoV-2 target nucleic acids are NOT DETECTED.  The SARS-CoV-2 RNA is generally detectable in upper respiratory specimens during the acute phase of infection. The lowest concentration of SARS-CoV-2 viral copies this assay can detect is 138 copies/mL. A negative result does not preclude SARS-Cov-2 infection and should not be used as the sole basis for treatment or other patient management decisions. A negative result may occur with  improper specimen collection/handling, submission of specimen other than nasopharyngeal swab, presence of viral mutation(s) within the areas targeted by this assay, and inadequate number of  viral copies(<138 copies/mL). A negative result must be combined with clinical observations, patient history, and epidemiological information. The expected result is Negative.  Fact Sheet for Patients:  04/22/20  Fact Sheet for Healthcare Providers:  04/18/20  This test is no t yet approved or cleared by the [employer] FDA and  has been authorized for detection and/or diagnosis of SARS-CoV-2 by FDA under an Emergency Use Authorization (EUA). This EUA will remain  in effect (meaning this test can be used) for the duration of the COVID-19 declaration under Section 564(b)(1) of the Act, 21 U.S.C.section 360bbb-3(b)(1), unless the authorization is terminated  or revoked sooner.       Influenza A by PCR NEGATIVE NEGATIVE Final   Influenza B by PCR NEGATIVE NEGATIVE Final    Comment: (NOTE) The Xpert Xpress SARS-CoV-2/FLU/RSV plus assay is intended as an aid in  the diagnosis of influenza from Nasopharyngeal swab specimens and should not be used as a sole basis for treatment. Nasal washings and aspirates are unacceptable for Xpert Xpress SARS-CoV-2/FLU/RSV testing.  Fact Sheet for Patients: BloggerCourse.com  Fact Sheet for Healthcare Providers: SeriousBroker.it  This test is not yet approved or cleared by the [place] FDA and has been authorized for detection and/or diagnosis of SARS-CoV-2 by FDA under an Emergency Use Authorization (EUA). This EUA will remain in effect (meaning this test can be used) for the duration of the COVID-19 declaration under Section 564(b)(1) of the Act, 21 U.S.C. section 360bbb-3(b)(1), unless the authorization is terminated or revoked.  Performed at [facility], [address], [place], [place] [address]       Studies: No results found.   Scheduled Meds: . amLODipine  10 mg Oral Daily  . aspirin EC  81 mg Oral Daily   . buPROPion  150 mg Oral Daily  . [START ON 02/25/2020] buPROPion  150 mg Oral BID  . carvedilol  25 mg Oral BID  . hydrALAZINE  25 mg Oral Q8H  . insulin aspart  0-15 Units Subcutaneous Q4H  . insulin detemir  12 Units Subcutaneous BID  . nicotine  21 mg Transdermal Daily  . pantoprazole  40 mg Oral BID AC  . rosuvastatin  10 mg Oral QHS   Continuous Infusions: . heparin 1,150 Units/hr (02/23/20 1400)  . [START ON 02/24/2020] vancomycin      Principal Problem:   PVD (peripheral vascular disease) with claudication (HCC) Active Problems:   Mixed hyperlipidemia   Current smoker   Essential hypertension   GERD   Hypokalemia   DM type 2 causing vascular disease (HCC)   Chronic diastolic heart failure (HCC)   Morbid obesity (HCC)   Microcytic anemia     Tamaira Ciriello Tublu Elanda Garmany, [facility]  If 7PM-7AM, please contact night-coverage [email or web address] Password [email or web address] 02/23/2020, 3:43 PM    LOS: 4 days

## 2020-02-23 NOTE — Progress Notes (Signed)
ANTICOAGULATION CONSULT NOTE -   Pharmacy Consult for IV Heparin Indication: Critical PAD  Allergies  Allergen Reactions  . Lisinopril-Hydrochlorothiazide Hives and Itching  . Penicillins   . Sulfonamide Derivatives Other (See Comments)    Hives    Patient Measurements: Height: 5\' 4"  (162.6 cm) Weight: 102.1 kg (225 lb) IBW/kg (Calculated) : 54.7 Heparin Dosing Weight: 78.5 kg  Vital Signs: Temp: 98.7 F (37.1 C) (01/09 0405) Temp Source: Oral (01/09 0405) BP: 151/59 (01/09 0405) Pulse Rate: 55 (01/09 0405)  Labs: Recent Labs    02/21/20 0512 02/21/20 0512 02/21/20 0702 02/21/20 1320 02/22/20 0216 02/23/20 0228  HGB  --    < > 12.3  --  11.5* 11.0*  HCT  --   --  39.9  --  37.0 36.1  PLT  --   --  340  --  315 333  HEPARINUNFRC 0.75*  --   --  0.63 0.35 0.46  CREATININE 0.81  --   --   --  0.81 0.94   < > = values in this interval not displayed.    Estimated Creatinine Clearance: 65.7 mL/min (by C-G formula based on SCr of 0.94 mg/dL).   Assessment: 70 yr old female presented to [facility] ED with bilateral leg numbness when sitting/lying, R worse than left; pulses were palpable in L foot, but unable to doppler dorsalis pedis in R foot. Pharmacy is consulted for heparin dosing for critical PAD. Per med rec, pt was on no anticoagulants PTA.  Heparin level remains therapeutic s/p rate decrease to 1150 units/hr, no issues noted. Will continue.   Goal of Therapy:  Heparin level 0.3-0.7 units/ml Monitor platelets by anticoagulation protocol: Yes   Plan:  Continue heparin gtt at 1150 units/hr F/u heparin level with AM labs Monitor VVS plans  [facility], PharmD PGY2 ID Pharmacy Resident Phone between 7 am - 3:30 pm: Margarite Gouge  Please check AMION for all [facility] Pharmacy phone numbers After 10:00 PM, call Main Pharmacy [phone]  02/23/2020 7:31 AM

## 2020-02-23 NOTE — Progress Notes (Signed)
[facility]  Subjective  -right foot pain stable, foot warm, wiggling toes.   Objective (!) 153/91 (!) 59 97.9 F (36.6 C) 18 97%  Intake/Output Summary (Last 24 hours) at 02/23/2020 1007 Last data filed at 02/23/2020 0300 Gross per 24 hour  Intake 520 ml  Output -  Net 520 ml    Right foot still appears viable and is motor intact and warm  Laboratory Lab Results: Recent Labs    02/22/20 0216 02/23/20 0228  WBC 9.4 8.8  HGB 11.5* 11.0*  HCT 37.0 36.1  PLT 315 333   BMET Recent Labs    02/22/20 0216 02/23/20 0228  NA 138 137  K 3.6 3.5  CL 102 101  CO2 26 26  GLUCOSE 136* 157*  BUN 9 10  CREATININE 0.81 0.94  CALCIUM 8.9 8.9    COAG Lab Results  Component Value Date   INR 1.00 04/30/2015   No results found for: PTT  Assessment/Planning:  70 year old female with multiple medical issues as previously noted.  After review of CTA she has occluded right external iliac artery stent and also an occluded right common femoral artery.  This is in the same extremity as a previous failed common femoral to above-knee popliteal bypass and now failed external iliac stent.    Plan to proceeding to the operating room tomorrow for a right common femoral endarterectomy and likely iliac stenting procedure through a hybrid approach.  Discussed she will be very high risk for complications particularly given her obesity and I worry about groin breakdown.  She seems understanding and wishes to proceed.  Please keep NPO after midnight.    Cephus Shelling 02/23/2020 10:07 AM --

## 2020-02-24 ENCOUNTER — Encounter (HOSPITAL_COMMUNITY)
Admission: EM | Disposition: A | Discharge status: Home Health | Payer: Self-pay | Source: Home / Self Care | Attending: Internal Medicine

## 2020-02-24 ENCOUNTER — Inpatient Hospital Stay (HOSPITAL_COMMUNITY): Payer: Medicare Other | Admitting: Anesthesiology

## 2020-02-24 ENCOUNTER — Inpatient Hospital Stay (HOSPITAL_COMMUNITY): Payer: Medicare Other

## 2020-02-24 DIAGNOSIS — I70222 Atherosclerosis of native arteries of extremities with rest pain, left leg: Secondary | ICD-10-CM

## 2020-02-24 DIAGNOSIS — I739 Peripheral vascular disease, unspecified: Secondary | ICD-10-CM | POA: Diagnosis not present

## 2020-02-24 DIAGNOSIS — T82856A Stenosis of peripheral vascular stent, initial encounter: Secondary | ICD-10-CM

## 2020-02-24 DIAGNOSIS — T82858A Stenosis of vascular prosthetic devices, implants and grafts, initial encounter: Secondary | ICD-10-CM

## 2020-02-24 HISTORY — PX: ULTRASOUND GUIDANCE FOR VASCULAR ACCESS: SHX6516

## 2020-02-24 HISTORY — PX: PATCH ANGIOPLASTY: SHX6230

## 2020-02-24 HISTORY — PX: ENDARTERECTOMY FEMORAL: SHX5804

## 2020-02-24 HISTORY — PX: INSERTION OF ILIAC STENT: SHX6256

## 2020-02-24 HISTORY — PX: INTRAOPERATIVE ARTERIOGRAM: SHX5157

## 2020-02-24 LAB — CBC
HCT: 37 % (ref 36.0–46.0)
HCT: 37.7 % (ref 36.0–46.0)
Hemoglobin: 11.5 g/dL — ABNORMAL LOW (ref 12.0–15.0)
Hemoglobin: 11.5 g/dL — ABNORMAL LOW (ref 12.0–15.0)
MCH: 24.5 pg — ABNORMAL LOW (ref 26.0–34.0)
MCH: 24.2 pg — ABNORMAL LOW (ref 26.0–34.0)
MCHC: 31.1 g/dL (ref 30.0–36.0)
MCHC: 30.5 g/dL (ref 30.0–36.0)
MCV: 78.7 fL — ABNORMAL LOW (ref 80.0–100.0)
MCV: 79.2 fL — ABNORMAL LOW (ref 80.0–100.0)
Platelets: 330 10*3/uL (ref 150–400)
Platelets: 314 10*3/uL (ref 150–400)
RBC: 4.7 MIL/uL (ref 3.87–5.11)
RBC: 4.76 MIL/uL (ref 3.87–5.11)
RDW: 15 % (ref 11.5–15.5)
RDW: 15 % (ref 11.5–15.5)
WBC: 9.4 10*3/uL (ref 4.0–10.5)
WBC: 11.3 10*3/uL — ABNORMAL HIGH (ref 4.0–10.5)
nRBC: 0 % (ref 0.0–0.2)
nRBC: 0 % (ref 0.0–0.2)

## 2020-02-24 LAB — BASIC METABOLIC PANEL
Anion gap: 10 (ref 5–15)
BUN: 8 mg/dL (ref 8–23)
CO2: 27 mmol/L (ref 22–32)
Calcium: 9.2 mg/dL (ref 8.9–10.3)
Chloride: 101 mmol/L (ref 98–111)
Creatinine, Ser: 0.88 mg/dL (ref 0.44–1.00)
GFR, Estimated: >60 mL/min (ref >60)
Glucose, Bld: 110 mg/dL — ABNORMAL HIGH (ref 70–99)
Potassium: 3.9 mmol/L (ref 3.5–5.1)
Sodium: 138 mmol/L (ref 135–145)

## 2020-02-24 LAB — POCT ACTIVATED CLOTTING TIME
Activated Clotting Time: 285 seconds
Activated Clotting Time: 249 seconds
Activated Clotting Time: 261 seconds
Activated Clotting Time: 142 seconds
Activated Clotting Time: 243 seconds

## 2020-02-24 LAB — SURGICAL PCR SCREEN
MRSA, PCR: NEGATIVE
Staphylococcus aureus: NEGATIVE

## 2020-02-24 LAB — TYPE AND SCREEN
ABO/RH(D): B NEG
Antibody Screen: NEGATIVE

## 2020-02-24 LAB — GLUCOSE, CAPILLARY
Glucose-Capillary: 134 mg/dL — ABNORMAL HIGH (ref 70–99)
Glucose-Capillary: 128 mg/dL — ABNORMAL HIGH (ref 70–99)
Glucose-Capillary: 191 mg/dL — ABNORMAL HIGH (ref 70–99)
Glucose-Capillary: 209 mg/dL — ABNORMAL HIGH (ref 70–99)
Glucose-Capillary: 175 mg/dL — ABNORMAL HIGH (ref 70–99)
Glucose-Capillary: 204 mg/dL — ABNORMAL HIGH (ref 70–99)

## 2020-02-24 LAB — HEPARIN LEVEL (UNFRACTIONATED): Heparin Unfractionated: 0.34 IU/mL (ref 0.30–0.70)

## 2020-02-24 LAB — CREATININE, SERUM
Creatinine, Ser: 1.24 mg/dL — ABNORMAL HIGH (ref 0.44–1.00)
GFR, Estimated: 47 mL/min — ABNORMAL LOW (ref >60)

## 2020-02-24 LAB — ABO/RH: ABO/RH(D): B NEG

## 2020-02-24 SURGERY — ENDARTERECTOMY, FEMORAL
Anesthesia: General | Site: Groin | Laterality: Right

## 2020-02-24 MED ORDER — METOPROLOL TARTRATE 5 MG/5ML IV SOLN: 2.0000 mg | INTRAVENOUS | Status: DC | PRN

## 2020-02-24 MED ORDER — LACTATED RINGERS IV SOLN: INTRAVENOUS | Status: DC | PRN

## 2020-02-24 MED ORDER — PHENYLEPHRINE HCL-NACL 10-0.9 MG/250ML-% IV SOLN
INTRAVENOUS | Status: DC | PRN
  Administered 2020-02-24: 20 ug/min via INTRAVENOUS

## 2020-02-24 MED ORDER — CLOPIDOGREL BISULFATE 300 MG PO TABS
300.0000 mg | ORAL_TABLET | Freq: Once | ORAL | Status: AC
  Administered 2020-02-24: 300 mg via ORAL
  Filled 2020-02-24: qty 1

## 2020-02-24 MED ORDER — CHLORHEXIDINE GLUCONATE CLOTH 2 % EX PADS
6.0000 | MEDICATED_PAD | Freq: Every day | CUTANEOUS | Status: DC
  Administered 2020-02-24 – 2020-02-26 (×3): 6 via TOPICAL

## 2020-02-24 MED ORDER — OXYCODONE HCL 5 MG PO TABS: 5.0000 mg | ORAL_TABLET | Freq: Once | ORAL | Status: DC | PRN

## 2020-02-24 MED ORDER — HEPARIN SODIUM (PORCINE) 5000 UNIT/ML IJ SOLN
5000.0000 [IU] | Freq: Three times a day (TID) | INTRAMUSCULAR | Status: DC
  Administered 2020-02-25 – 2020-02-27 (×7): 5000 [IU] via SUBCUTANEOUS
  Filled 2020-02-24 (×7): qty 1

## 2020-02-24 MED ORDER — HYDRALAZINE HCL 20 MG/ML IJ SOLN: 5.0000 mg | INTRAMUSCULAR | Status: DC | PRN

## 2020-02-24 MED ORDER — VANCOMYCIN HCL IN DEXTROSE 1-5 GM/200ML-% IV SOLN
1000.0000 mg | INTRAVENOUS | Status: AC
  Administered 2020-02-25: 1000 mg via INTRAVENOUS
  Filled 2020-02-24 (×2): qty 200

## 2020-02-24 MED ORDER — DOCUSATE SODIUM 100 MG PO CAPS
100.0000 mg | ORAL_CAPSULE | Freq: Every day | ORAL | Status: DC
  Administered 2020-02-25 – 2020-02-27 (×3): 100 mg via ORAL
  Filled 2020-02-24 (×3): qty 1

## 2020-02-24 MED ORDER — ONDANSETRON HCL 4 MG/2ML IJ SOLN: 4.0000 mg | Freq: Once | INTRAMUSCULAR | Status: DC | PRN

## 2020-02-24 MED ORDER — HYDROMORPHONE HCL 1 MG/ML IJ SOLN
0.5000 mg | INTRAMUSCULAR | Status: DC | PRN
  Administered 2020-02-25 – 2020-02-27 (×2): 1 mg via INTRAVENOUS
  Filled 2020-02-24 (×2): qty 1

## 2020-02-24 MED ORDER — MUPIROCIN 2 % EX OINT
1.0000 "application " | TOPICAL_OINTMENT | Freq: Two times a day (BID) | CUTANEOUS | Status: DC
  Administered 2020-02-24 – 2020-02-26 (×3): 1 via NASAL
  Filled 2020-02-24 (×2): qty 22

## 2020-02-24 MED ORDER — DEXAMETHASONE SODIUM PHOSPHATE 10 MG/ML IJ SOLN
INTRAMUSCULAR | Status: AC
  Filled 2020-02-24: qty 1

## 2020-02-24 MED ORDER — LIDOCAINE 2% (20 MG/ML) 5 ML SYRINGE
INTRAMUSCULAR | Status: AC
  Filled 2020-02-24: qty 5

## 2020-02-24 MED ORDER — PROPOFOL 10 MG/ML IV BOLUS
INTRAVENOUS | Status: DC | PRN
  Administered 2020-02-24: 100 mg via INTRAVENOUS

## 2020-02-24 MED ORDER — PROTAMINE SULFATE 10 MG/ML IV SOLN
INTRAVENOUS | Status: DC | PRN
  Administered 2020-02-24: 20 mg via INTRAVENOUS
  Administered 2020-02-24: 30 mg via INTRAVENOUS

## 2020-02-24 MED ORDER — LABETALOL HCL 5 MG/ML IV SOLN
10.0000 mg | INTRAVENOUS | Status: DC | PRN
  Administered 2020-02-24: 10 mg via INTRAVENOUS
  Filled 2020-02-24: qty 4

## 2020-02-24 MED ORDER — MAGNESIUM SULFATE 2 GM/50ML IV SOLN: 2.0000 g | Freq: Every day | INTRAVENOUS | Status: DC | PRN

## 2020-02-24 MED ORDER — FENTANYL CITRATE (PF) 250 MCG/5ML IJ SOLN
INTRAMUSCULAR | Status: AC
  Filled 2020-02-24: qty 5

## 2020-02-24 MED ORDER — VANCOMYCIN HCL IN DEXTROSE 1-5 GM/200ML-% IV SOLN
1000.0000 mg | Freq: Two times a day (BID) | INTRAVENOUS | Status: DC
Start: 2020-02-24 — End: 2020-02-24
  Filled 2020-02-24 (×2): qty 200

## 2020-02-24 MED ORDER — HYDROMORPHONE HCL 1 MG/ML IJ SOLN
0.2500 mg | INTRAMUSCULAR | Status: DC | PRN
  Administered 2020-02-24 (×2): 0.5 mg via INTRAVENOUS

## 2020-02-24 MED ORDER — CLOPIDOGREL BISULFATE 75 MG PO TABS
75.0000 mg | ORAL_TABLET | Freq: Every day | ORAL | Status: DC
  Administered 2020-02-25 – 2020-02-27 (×3): 75 mg via ORAL
  Filled 2020-02-24 (×3): qty 1

## 2020-02-24 MED ORDER — LIDOCAINE 2% (20 MG/ML) 5 ML SYRINGE
INTRAMUSCULAR | Status: DC | PRN
  Administered 2020-02-24: 100 mg via INTRAVENOUS

## 2020-02-24 MED ORDER — PROTAMINE SULFATE 10 MG/ML IV SOLN
INTRAVENOUS | Status: AC
  Filled 2020-02-24: qty 5

## 2020-02-24 MED ORDER — GUAIFENESIN-DM 100-10 MG/5ML PO SYRP
15.0000 mL | ORAL_SOLUTION | ORAL | Status: DC | PRN
Start: 2020-02-24 — End: 2020-02-27
  Administered 2020-02-25: 15 mL via ORAL
  Filled 2020-02-24: qty 15

## 2020-02-24 MED ORDER — SODIUM CHLORIDE 0.9 % IV SOLN: 500.0000 mL | Freq: Once | INTRAVENOUS | Status: DC | PRN

## 2020-02-24 MED ORDER — SUGAMMADEX SODIUM 200 MG/2ML IV SOLN
INTRAVENOUS | Status: DC | PRN
  Administered 2020-02-24: 300 mg via INTRAVENOUS

## 2020-02-24 MED ORDER — ONDANSETRON HCL 4 MG/2ML IJ SOLN
INTRAMUSCULAR | Status: DC | PRN
  Administered 2020-02-24: 4 mg via INTRAVENOUS

## 2020-02-24 MED ORDER — SODIUM CHLORIDE 0.9 % IV SOLN
INTRAVENOUS | Status: AC
  Filled 2020-02-24: qty 1.2

## 2020-02-24 MED ORDER — OXYCODONE-ACETAMINOPHEN 5-325 MG PO TABS
1.0000 | ORAL_TABLET | ORAL | Status: DC | PRN
  Administered 2020-02-25: 2 via ORAL
  Administered 2020-02-25: 1 via ORAL
  Administered 2020-02-26 – 2020-02-27 (×2): 2 via ORAL
  Filled 2020-02-24: qty 2
  Filled 2020-02-24: qty 1
  Filled 2020-02-24 (×2): qty 2

## 2020-02-24 MED ORDER — OXYCODONE HCL 5 MG/5ML PO SOLN: 5.0000 mg | Freq: Once | ORAL | Status: DC | PRN

## 2020-02-24 MED ORDER — ALUM & MAG HYDROXIDE-SIMETH 200-200-20 MG/5ML PO SUSP: 15.0000 mL | ORAL | Status: DC | PRN

## 2020-02-24 MED ORDER — HEPARIN SODIUM (PORCINE) 1000 UNIT/ML IJ SOLN
INTRAMUSCULAR | Status: AC
  Filled 2020-02-24: qty 1

## 2020-02-24 MED ORDER — ROCURONIUM BROMIDE 10 MG/ML (PF) SYRINGE
PREFILLED_SYRINGE | INTRAVENOUS | Status: DC | PRN
  Administered 2020-02-24: 20 mg via INTRAVENOUS
  Administered 2020-02-24: 70 mg via INTRAVENOUS
  Administered 2020-02-24: 20 mg via INTRAVENOUS
  Administered 2020-02-24: 30 mg via INTRAVENOUS

## 2020-02-24 MED ORDER — ACETAMINOPHEN 10 MG/ML IV SOLN: 1000.0000 mg | Freq: Once | INTRAVENOUS | Status: DC | PRN

## 2020-02-24 MED ORDER — SODIUM CHLORIDE 0.9 % IV SOLN
INTRAVENOUS | Status: DC | PRN
  Administered 2020-02-24: 500 mL

## 2020-02-24 MED ORDER — EPHEDRINE 5 MG/ML INJ
INTRAVENOUS | Status: AC
  Filled 2020-02-24: qty 10

## 2020-02-24 MED ORDER — POTASSIUM CHLORIDE CRYS ER 20 MEQ PO TBCR: 20.0000 meq | EXTENDED_RELEASE_TABLET | Freq: Every day | ORAL | Status: DC | PRN

## 2020-02-24 MED ORDER — HEPARIN SODIUM (PORCINE) 1000 UNIT/ML IJ SOLN
INTRAMUSCULAR | Status: DC | PRN
  Administered 2020-02-24: 11000 [IU] via INTRAVENOUS

## 2020-02-24 MED ORDER — ONDANSETRON HCL 4 MG/2ML IJ SOLN: 4.0000 mg | Freq: Four times a day (QID) | INTRAMUSCULAR | Status: DC | PRN

## 2020-02-24 MED ORDER — EPHEDRINE SULFATE-NACL 50-0.9 MG/10ML-% IV SOSY
PREFILLED_SYRINGE | INTRAVENOUS | Status: DC | PRN
  Administered 2020-02-24 (×4): 5 mg via INTRAVENOUS

## 2020-02-24 MED ORDER — SODIUM CHLORIDE 0.9 % IV SOLN: INTRAVENOUS | Status: DC

## 2020-02-24 MED ORDER — PHENOL 1.4 % MT LIQD: 1.0000 | OROMUCOSAL | Status: DC | PRN

## 2020-02-24 MED ORDER — HEMOSTATIC AGENTS (NO CHARGE) OPTIME
TOPICAL | Status: DC | PRN
  Administered 2020-02-24: 1 via TOPICAL

## 2020-02-24 MED ORDER — PROPOFOL 10 MG/ML IV BOLUS
INTRAVENOUS | Status: AC
  Filled 2020-02-24: qty 20

## 2020-02-24 MED ORDER — DEXAMETHASONE SODIUM PHOSPHATE 10 MG/ML IJ SOLN
INTRAMUSCULAR | Status: DC | PRN
  Administered 2020-02-24: 5 mg via INTRAVENOUS

## 2020-02-24 MED ORDER — HYDROMORPHONE HCL 1 MG/ML IJ SOLN
INTRAMUSCULAR | Status: AC
  Filled 2020-02-24: qty 1

## 2020-02-24 MED ORDER — IODIXANOL 320 MG/ML IV SOLN
INTRAVENOUS | Status: DC | PRN
  Administered 2020-02-24: 63 mL via INTRA_ARTERIAL

## 2020-02-24 MED ORDER — 0.9 % SODIUM CHLORIDE (POUR BTL) OPTIME
TOPICAL | Status: DC | PRN
  Administered 2020-02-24: 1000 mL

## 2020-02-24 MED ORDER — ONDANSETRON HCL 4 MG/2ML IJ SOLN
INTRAMUSCULAR | Status: AC
  Filled 2020-02-24: qty 2

## 2020-02-24 MED ORDER — FENTANYL CITRATE (PF) 250 MCG/5ML IJ SOLN
INTRAMUSCULAR | Status: DC | PRN
  Administered 2020-02-24 (×2): 25 ug via INTRAVENOUS
  Administered 2020-02-24: 50 ug via INTRAVENOUS
  Administered 2020-02-24: 100 ug via INTRAVENOUS
  Administered 2020-02-24: 25 ug via INTRAVENOUS

## 2020-02-24 MED ORDER — ROCURONIUM BROMIDE 10 MG/ML (PF) SYRINGE
PREFILLED_SYRINGE | INTRAVENOUS | Status: AC
  Filled 2020-02-24: qty 20

## 2020-02-24 SURGICAL SUPPLY — 67 items
ADH SKN CLS APL DERMABOND .7 (GAUZE/BANDAGES/DRESSINGS) ×3
AGENT HMST SPONGE THK3/8 (HEMOSTASIS)
BALLN MUSTANG 6.0X40 75 (BALLOONS) ×4
BALLN MUSTANG 6X60X75 (BALLOONS) ×4
BALLOON MUSTANG 6.0X40 75 (BALLOONS) IMPLANT
BALLOON MUSTANG 6X60X75 (BALLOONS) IMPLANT
CANISTER SUCT 3000ML PPV (MISCELLANEOUS) ×4 IMPLANT
CATH BEACON 5 .035 65 KMP TIP (CATHETERS) ×1 IMPLANT
CATH OMNI FLUSH 5F 65CM (CATHETERS) ×1 IMPLANT
CLIP VESOCCLUDE MED 24/CT (CLIP) ×4 IMPLANT
CLIP VESOCCLUDE SM WIDE 24/CT (CLIP) ×4 IMPLANT
COVER PROBE W GEL 5X96 (DRAPES) ×1 IMPLANT
COVER SURGICAL LIGHT HANDLE (MISCELLANEOUS) ×1 IMPLANT
COVER WAND RF STERILE (DRAPES) ×4 IMPLANT
DERMABOND ADVANCED (GAUZE/BANDAGES/DRESSINGS) ×1
DERMABOND ADVANCED .7 DNX12 (GAUZE/BANDAGES/DRESSINGS) ×3 IMPLANT
DEVICE ENSNARE  12MMX20MM (VASCULAR PRODUCTS) ×4
DEVICE ENSNARE 12MMX20MM (VASCULAR PRODUCTS) IMPLANT
DEVICE TORQUE KENDALL .025-038 (MISCELLANEOUS) ×1 IMPLANT
DRAIN CHANNEL 15F RND FF W/TCR (WOUND CARE) IMPLANT
DRSG TEGADERM 2-3/8X2-3/4 SM (GAUZE/BANDAGES/DRESSINGS) ×1 IMPLANT
ELECT REM PT RETURN 9FT ADLT (ELECTROSURGICAL) ×4
ELECTRODE REM PT RTRN 9FT ADLT (ELECTROSURGICAL) ×3 IMPLANT
EVACUATOR SILICONE 100CC (DRAIN) IMPLANT
GLIDEWIRE ADV .035X260CM (WIRE) ×1 IMPLANT
GLOVE BIO SURGEON STRL SZ7.5 (GLOVE) ×4 IMPLANT
GLOVE BIOGEL PI IND STRL 8 (GLOVE) ×3 IMPLANT
GLOVE BIOGEL PI INDICATOR 8 (GLOVE) ×1
GOWN STRL REUS W/ TWL LRG LVL3 (GOWN DISPOSABLE) ×9 IMPLANT
GOWN STRL REUS W/ TWL XL LVL3 (GOWN DISPOSABLE) ×6 IMPLANT
GOWN STRL REUS W/TWL LRG LVL3 (GOWN DISPOSABLE) ×12
GOWN STRL REUS W/TWL XL LVL3 (GOWN DISPOSABLE) ×8
HEMOSTAT SNOW SURGICEL 2X4 (HEMOSTASIS) ×1 IMPLANT
HEMOSTAT SPONGE AVITENE ULTRA (HEMOSTASIS) IMPLANT
KIT BASIN OR (CUSTOM PROCEDURE TRAY) ×4 IMPLANT
KIT ENCORE 26 ADVANTAGE (KITS) ×1 IMPLANT
KIT TURNOVER KIT B (KITS) ×4 IMPLANT
LOOP VESSEL MINI RED (MISCELLANEOUS) ×1 IMPLANT
NS IRRIG 1000ML POUR BTL (IV SOLUTION) ×8 IMPLANT
PACK PERIPHERAL VASCULAR (CUSTOM PROCEDURE TRAY) ×4 IMPLANT
PAD ARMBOARD 7.5X6 YLW CONV (MISCELLANEOUS) ×8 IMPLANT
PATCH VASC XENOSURE 1CMX6CM (Vascular Products) ×4 IMPLANT
PATCH VASC XENOSURE 1X6 (Vascular Products) IMPLANT
SET MICROPUNCTURE 5F STIFF (MISCELLANEOUS) ×1 IMPLANT
SET WALTER ACTIVATION W/DRAPE (SET/KITS/TRAYS/PACK) ×1 IMPLANT
SHEATH BRITE TIP 7FR 35CM (SHEATH) ×1 IMPLANT
SHEATH BRITE TIP 7FRX11 (SHEATH) ×1 IMPLANT
SHEATH PINNACLE 5F 10CM (SHEATH) ×1 IMPLANT
SPONGE GAUZE 2X2 8PLY STRL LF (GAUZE/BANDAGES/DRESSINGS) ×1 IMPLANT
STENT VIABAHN 7X50X120 (Permanent Stent) ×8 IMPLANT
STENT VIABAHN 7X5X120 7FR (Permanent Stent) IMPLANT
STENT VIABAHNBX 8X59X80 (Permanent Stent) ×1 IMPLANT
SUT MNCRL AB 4-0 PS2 18 (SUTURE) ×4 IMPLANT
SUT PROLENE 5 0 C 1 24 (SUTURE) ×7 IMPLANT
SUT PROLENE 6 0 BV (SUTURE) ×5 IMPLANT
SUT VIC AB 2-0 CT1 27 (SUTURE) ×20
SUT VIC AB 2-0 CT1 TAPERPNT 27 (SUTURE) ×3 IMPLANT
SUT VIC AB 3-0 SH 27 (SUTURE) ×8
SUT VIC AB 3-0 SH 27X BRD (SUTURE) ×3 IMPLANT
SYR 10ML LL (SYRINGE) ×1 IMPLANT
SYR BULB IRRIG 60ML STRL (SYRINGE) ×1 IMPLANT
TOWEL GREEN STERILE (TOWEL DISPOSABLE) ×4 IMPLANT
TRAY FOLEY MTR SLVR 16FR STAT (SET/KITS/TRAYS/PACK) IMPLANT
UNDERPAD 30X36 HEAVY ABSORB (UNDERPADS AND DIAPERS) ×4 IMPLANT
WATER STERILE IRR 1000ML POUR (IV SOLUTION) ×4 IMPLANT
WIRE BENTSON .035X145CM (WIRE) ×2 IMPLANT
WIRE G V18X300CM (WIRE) ×1 IMPLANT

## 2020-02-24 NOTE — Progress Notes (Signed)
[facility]  Subjective  -right foot pain stable, foot warm, wiggling toes.   Objective (!) 158/54 67 98 F (36.7 C) (Oral) 16 91%  Intake/Output Summary (Last 24 hours) at 02/24/2020 0743 Last data filed at 02/23/2020 1900 Gross per 24 hour  Intake 856.04 ml  Output -  Net 856.04 ml    Right foot still appears viable and is motor intact and warm No right femoral pulse  Laboratory Lab Results: Recent Labs    02/23/20 0228 02/24/20 0200  WBC 8.8 9.4  HGB 11.0* 11.5*  HCT 36.1 37.0  PLT 333 330   BMET Recent Labs    02/23/20 0228 02/24/20 0200  NA 137 138  K 3.5 3.9  CL 101 101  CO2 26 27  GLUCOSE 157* 110*  BUN 10 8  CREATININE 0.94 0.88  CALCIUM 8.9 9.2    COAG Lab Results  Component Value Date   INR 1.00 04/30/2015   No results found for: PTT  Assessment/Planning:  70 year old female with multiple medical issues as previously noted.  After review of CTA she has occluded right external iliac artery stent and also an occluded right common femoral artery.  This is in the same extremity as a previous failed common femoral to above-knee popliteal bypass and now failed external iliac stent.    Plan for a right common femoral endarterectomy and likely iliac stenting procedure through a hybrid approach.  Discussed she will be very high risk for complications particularly given her obesity and I worry about groin breakdown.  Risks and benefits discussed.  Cephus Shelling 02/24/2020 7:43 AM --

## 2020-02-24 NOTE — Progress Notes (Signed)
Due to CrCl of 29ml/min, will change his post op vanc to 1g IV x1.  Ulyses Southward, PharmD, BCIDP, AAHIVP, CPP Infectious Disease Pharmacist 02/24/2020 6:29 PM

## 2020-02-24 NOTE — Anesthesia Procedure Notes (Signed)
Procedure Name: Intubation Date/Time: 02/24/2020 8:01 AM Performed by: Thelma Comp, CRNA Pre-anesthesia Checklist: Patient identified, Emergency Drugs available, Suction available and Patient being monitored Patient Re-evaluated:Patient Re-evaluated prior to induction Oxygen Delivery Method: Circle System Utilized Preoxygenation: Pre-oxygenation with 100% oxygen Induction Type: IV induction Ventilation: Mask ventilation without difficulty Laryngoscope Size: Mac and 3 Grade View: Grade I Tube type: Oral Tube size: 7.0 mm Number of attempts: 1 Airway Equipment and Method: Stylet and Oral airway Placement Confirmation: ETT inserted through vocal cords under direct vision,  positive ETCO2 and breath sounds checked- equal and bilateral Secured at: 22 cm Tube secured with: Tape Dental Injury: Teeth and Oropharynx as per pre-operative assessment

## 2020-02-24 NOTE — Anesthesia Preprocedure Evaluation (Signed)
Anesthesia Evaluation  Patient identified by MRN, date of birth, ID band Patient awake    Reviewed: Allergy & Precautions, H&P , NPO status , Patient's Chart, lab work & pertinent test results  Airway Mallampati: II  TM Distance: >3 FB Neck ROM: Full    Dental no notable dental hx.    Pulmonary COPD, Current Smoker,    Pulmonary exam normal breath sounds clear to auscultation       Cardiovascular hypertension, Pt. on medications + Peripheral Vascular Disease  Normal cardiovascular exam Rhythm:Regular Rate:Normal     Neuro/Psych negative neurological ROS  negative psych ROS   GI/Hepatic negative GI ROS, Neg liver ROS,   Endo/Other  diabetes, Insulin Dependent  Renal/GU negative Renal ROS  negative genitourinary   Musculoskeletal negative musculoskeletal ROS (+)   Abdominal   Peds negative pediatric ROS (+)  Hematology negative hematology ROS (+)   Anesthesia Other Findings   Reproductive/Obstetrics negative OB ROS                             Anesthesia Physical Anesthesia Plan  ASA: III  Anesthesia Plan: General   Post-op Pain Management:    Induction: Intravenous  PONV Risk Score and Plan: 3 and Ondansetron, Dexamethasone and Treatment may vary due to age or medical condition  Airway Management Planned: Oral ETT  Additional Equipment:   Intra-op Plan:   Post-operative Plan: Extubation in OR  Informed Consent: I have reviewed the patients History and Physical, chart, labs and discussed the procedure including the risks, benefits and alternatives for the proposed anesthesia with the patient or authorized representative who has indicated his/her understanding and acceptance.     Dental advisory given  Plan Discussed with: CRNA and Surgeon  Anesthesia Plan Comments:         Anesthesia Quick Evaluation

## 2020-02-24 NOTE — Progress Notes (Signed)
Received verbal order to stop heparin by Dr. Chestine Spore

## 2020-02-24 NOTE — Anesthesia Procedure Notes (Signed)
Arterial Line Insertion Start/End1/11/2020 8:09 AM, 02/24/2020 8:09 AM Performed by: Modena Morrow, CRNA, CRNA  Patient location: Pre-op. Preanesthetic checklist: patient identified, IV checked, site marked, risks and benefits discussed, surgical consent, monitors and equipment checked, pre-op evaluation, timeout performed and anesthesia consent Lidocaine 1% used for infiltration Left, radial was placed Catheter size: 20 Fr Hand hygiene performed  and maximum sterile barriers used   Attempts: 1 Procedure performed without using ultrasound guided technique. Following insertion, dressing applied and Biopatch. Post procedure assessment: normal and unchanged  Patient tolerated the procedure well with no immediate complications.

## 2020-02-24 NOTE — Plan of Care (Signed)
Pt. Received from PACU approximately 1530 in bed accompanied by RN. She is alert and oriented. Dilaudid given prior to transfer. No pain at this time. Right groin has skin glue, edges are approximated, no drainage, no hematoma. Left groin is covered with guaze and tegaderm. No drainage. Pulses audible with doppler on b/l lower extremities. RLE warm to touch. Pt. Has NS running at 100/cc hr. VS are WNL. Brynda Rim, RN

## 2020-02-24 NOTE — Plan of Care (Signed)
  Problem: Education: Goal: Knowledge of General Education information will improve Description Including pain rating scale, medication(s)/side effects and non-pharmacologic comfort measures Outcome: Progressing   Problem: Clinical Measurements: Goal: Will remain free from infection Outcome: Progressing Goal: Cardiovascular complication will be avoided Outcome: Progressing   Problem: Activity: Goal: Risk for activity intolerance will decrease Outcome: Progressing   

## 2020-02-24 NOTE — Progress Notes (Signed)
Patient ID: RAND BOLLER, female   DOB: Aug 04, 1950, 70 y.o.   MRN: [id number]  PROGRESS NOTE    Katherine Williams  DPO:[id number] DOB: [birth year] DOA: 02/19/2020 PCP: Benita Stabile, MD    Brief Narrative:  70 year old female with PMH significant for PVD, HTN, DM 2 on insulin pump and ongoing tobacco use is status post bilateral superficial femoropopliteal bypass with stent placement was readmitted on 02/19/2020 due to numbness and tingling in her right lower extremity.  Work-up revealed no flow at the right ankle and she was found to have occluded right external iliac stent and common femoral artery.  Seen by vascular surgery and plan is for hybrid procedure with endarterectomy of right common femoral and iliac stenting on 02/24/2020.   Assessment & Plan:   Principal Problem:   PVD (peripheral vascular disease) with claudication (HCC) Active Problems:   Mixed hyperlipidemia   Current smoker   Essential hypertension   GERD   Hypokalemia   DM type 2 causing vascular disease (HCC)   Chronic diastolic heart failure (HCC)   Morbid obesity (HCC)   Microcytic anemia  PVD S/p Re-vascularization today  Ongoing tobacco use On bupropion and nicotine patch, also as needed nicotine gum States she is trying very hard to quit smoking  DM2 Blood sugar under excellent control on present regimen We will ask diabetes coordinator for consultation regarding glucometer readings at home  HTN Continue amlodipine, carvedilol, hydralazine HCTZ and lisinopril are on hold  HFpEF No evidence for decompensation  HL Continue statin   DVT prophylaxis: Heparin SQ Code Status: Full code  Family Communication: patient at bedside Disposition Plan: home when stable   Consultants:   Vascular surgery  Procedures:  Right femoral endarterectomy angioplasty and stenting of the right common iliac and external iliac  Antimicrobials: Anti-infectives (From admission, onward)   Start     Dose/Rate Route  Frequency Ordered Stop   02/24/20 2000  vancomycin (VANCOCIN) IVPB 1000 mg/200 mL premix        1,000 mg 200 mL/hr over 60 Minutes Intravenous Every 12 hours 02/24/20 1458 02/25/20 1959   02/24/20 0000  vancomycin (VANCOCIN) IVPB 1000 mg/200 mL premix        1,000 mg 200 mL/hr over 60 Minutes Intravenous To Surgery 02/23/20 0916 02/24/20 0801       Subjective: Very happy about the outcome of her procedure today  Objective: Vitals:   02/24/20 1405 02/24/20 1420 02/24/20 1449 02/24/20 1652  BP: 140/60 (!) 157/65 (!) 159/60 (!) 162/58  Pulse: 77 69 68 69  Resp: 20 18 18 17   Temp:   97.9 F (36.6 C) 98.2 F (36.8 C)  TempSrc:   Axillary Oral  SpO2: 98% 98% 99% 98%  Weight:      Height:        Intake/Output Summary (Last 24 hours) at 02/24/2020 1731 Last data filed at 02/24/2020 1600 Gross per 24 hour  Intake 1090 ml  Output 550 ml  Net 540 ml   Filed Weights   02/19/20 1529  Weight: 102.1 kg    Examination:  General exam: Appears calm and comfortable  Respiratory system: Clear to auscultation. Respiratory effort normal. Cardiovascular system: S1 & S2 heard, RRR.  Gastrointestinal system: Abdomen is nondistended, soft and nontender.  Central nervous system: Alert and oriented. No focal neurological deficits. Extremities: Symmetric  Skin: No rashes Psychiatry: Judgement and insight appear normal. Mood & affect appropriate.     Data Reviewed: I have personally reviewed following  labs and imaging studies  CBC: Recent Labs  Lab 02/21/20 0702 02/22/20 0216 02/23/20 0228 02/24/20 0200 02/24/20 1402  WBC 10.4 9.4 8.8 9.4 11.3*  HGB 12.3 11.5* 11.0* 11.5* 11.5*  HCT 39.9 37.0 36.1 37.0 37.7  MCV 80.3 79.4* 79.2* 78.7* 79.2*  PLT 340 315 333 330 314   Basic Metabolic Panel: Recent Labs  Lab 02/19/20 1944 02/20/20 0147 02/21/20 0512 02/22/20 0216 02/23/20 0228 02/24/20 0200 02/24/20 1402  NA 136 137 139 138 137 138  --   K 2.6* 2.9* 3.7 3.6 3.5 3.9  --    CL 94* 97* 102 102 101 101  --   CO2 31 30 27 26 26 27   --   GLUCOSE 164* 261* 110* 136* 157* 110*  --   BUN 13 12 12 9 10 8   --   CREATININE 0.82 0.86 0.81 0.81 0.94 0.88 1.24*  CALCIUM 9.1 8.5* 9.0 8.9 8.9 9.2  --   MG 1.8  --   --  1.9  --   --   --   PHOS  --   --   --  2.9  --   --   --    GFR: Estimated Creatinine Clearance: 49.8 mL/min (A) (by C-G formula based on SCr of 1.24 mg/dL (H)). Liver Function Tests: Recent Labs  Lab 02/23/20 0228  AST 25  ALT 21  ALKPHOS 43  BILITOT 0.6  PROT 6.4*  ALBUMIN 2.9*   CBG: Recent Labs  Lab 02/23/20 1946 02/23/20 2357 02/24/20 0304 02/24/20 1250 02/24/20 1651  GLUCAP 211* 123* 128* 191* 209*   \Anemia Panel: Recent Labs    02/23/20 0228  FERRITIN 135     Recent Results (from the past 240 hour(s))  Resp Panel by RT-PCR (Flu A&B, Covid) Nasopharyngeal Swab     Status: None   Collection Time: 02/19/20  9:37 PM   Specimen: Nasopharyngeal Swab; Nasopharyngeal(NP) swabs in vial transport medium  Result Value Ref Range Status   SARS Coronavirus 2 by RT PCR NEGATIVE NEGATIVE Final    Comment: (NOTE) SARS-CoV-2 target nucleic acids are NOT DETECTED.  The SARS-CoV-2 RNA is generally detectable in upper respiratory specimens during the acute phase of infection. The lowest concentration of SARS-CoV-2 viral copies this assay can detect is 138 copies/mL. A negative result does not preclude SARS-Cov-2 infection and should not be used as the sole basis for treatment or other patient management decisions. A negative result may occur with  improper specimen collection/handling, submission of specimen other than nasopharyngeal swab, presence of viral mutation(s) within the areas targeted by this assay, and inadequate number of viral copies(<138 copies/mL). A negative result must be combined with clinical observations, patient history, and epidemiological information. The expected result is Negative.  Fact Sheet for Patients:   04/22/20  Fact Sheet for Healthcare Providers:  04/18/20  This test is no t yet approved or cleared by the [employer] FDA and  has been authorized for detection and/or diagnosis of SARS-CoV-2 by FDA under an Emergency Use Authorization (EUA). This EUA will remain  in effect (meaning this test can be used) for the duration of the COVID-19 declaration under Section 564(b)(1) of the Act, 21 U.S.C.section 360bbb-3(b)(1), unless the authorization is terminated  or revoked sooner.       Influenza A by PCR NEGATIVE NEGATIVE Final   Influenza B by PCR NEGATIVE NEGATIVE Final    Comment: (NOTE) The Xpert Xpress SARS-CoV-2/FLU/RSV plus assay is intended as an aid in  the diagnosis of influenza from Nasopharyngeal swab specimens and should not be used as a sole basis for treatment. Nasal washings and aspirates are unacceptable for Xpert Xpress SARS-CoV-2/FLU/RSV testing.  Fact Sheet for Patients: [employer]  Fact Sheet for Healthcare Providers: [employer]  This test is not yet approved or cleared by the [place] FDA and has been authorized for detection and/or diagnosis of SARS-CoV-2 by FDA under an Emergency Use Authorization (EUA). This EUA will remain in effect (meaning this test can be used) for the duration of the COVID-19 declaration under Section 564(b)(1) of the Act, 21 U.S.C. section 360bbb-3(b)(1), unless the authorization is terminated or revoked.  Performed at [facility], [address] [place], [place] [address]   Surgical PCR screen     Status: None   Collection Time: 02/24/20  2:45 AM   Specimen: Nasal Mucosa; Nasal Swab  Result Value Ref Range Status   MRSA, PCR NEGATIVE NEGATIVE Final   Staphylococcus aureus NEGATIVE NEGATIVE Final    Comment: (NOTE) The Xpert SA Assay (FDA approved for NASAL specimens in patients 22 years of  age and older), is one component of a comprehensive surveillance program. It is not intended to diagnose infection nor to guide or monitor treatment. Performed at [facility], [address] [place], [place] [address]       Radiology Studies: HYBRID OR IMAGING [facility] ONLY)  Result Date: 02/24/2020 There is no interpretation for this exam.  This order is for images obtained during a surgical procedure.  Please See "Surgeries" Tab for more information regarding the procedure.     Scheduled Meds: . amLODipine  10 mg Oral Daily  . aspirin EC  81 mg Oral Daily  . [START ON 02/25/2020] buPROPion  150 mg Oral BID  . carvedilol  25 mg Oral BID  . Chlorhexidine Gluconate Cloth  6 each Topical Daily  . [START ON 02/25/2020] clopidogrel  75 mg Oral Q0600  . [START ON 02/25/2020] docusate sodium  100 mg Oral Daily  . [START ON 02/25/2020] heparin  5,000 Units Subcutaneous Q8H  . hydrALAZINE  25 mg Oral Q8H  . HYDROmorphone      . insulin aspart  0-15 Units Subcutaneous Q4H  . insulin detemir  12 Units Subcutaneous BID  . mupirocin ointment  1 application Nasal BID  . nicotine  21 mg Transdermal Daily  . pantoprazole  40 mg Oral BID AC  . rosuvastatin  10 mg Oral QHS   Continuous Infusions: . sodium chloride    . sodium chloride 100 mL/hr at 02/24/20 1600  . magnesium sulfate bolus IVPB    . vancomycin       LOS: 5 days    Reva Bores, MD 02/24/2020 5:31 PM [phone] [facility] If 7PM-7AM, please contact night-coverage 02/24/2020, 5:31 PM

## 2020-02-24 NOTE — Op Note (Addendum)
Date: February 24, 2020  Preoperative diagnosis: Critical limb ischemia of the right lower extremity with rest pain including occluded right external iliac stents, occluded right common femoral artery, and occluded right common femoral to above knee popliteal artery bypass  Postoperative diagnosis: Same  Procedure: 1.  Redo exposure of right common femoral artery 2.  Right common femoral endarterectomy with profundoplasty and bovine pericardial patch angioplasty 3.  Aortogram with iliac arteriogram 4.  Ultrasound-guided access of left common femoral artery 5.  Retrograde angioplasty with stenting of the right common iliac artery with a 8 mm x 59 mm VBX 6.  Retrograde angioplasty with stenting of the right external iliac artery with 7 mm x 50 mm Viabahn x2 and post-dilated with 6 mm Mustang  Surgeon: Dr. Cephus Shelling, MD  Assistant: Dr. Waverly Ferrari, MD and Nathanial Rancher, [place]  Indication: Patient is a 70 year old female who was seen in consultation as a transfer from [facility] with worsening rest pain in the right foot and no identifiable Doppler flow at the ankle.  She was found to have an ABI of 0 with occluded right external iliac stent and also an occluded right common femoral artery and right common femoral to above-knee popliteal bypass.  She presents today for planned hybrid approach with right femoral endarterectomy and retrograde iliac stenting after risk and benefits discussed.  An assistant was needed for exposure and to expedite the case.  Findings: Patient is morbidly obese and redo right common femoral artery exposure through a horizontal groin incision was required.  Extensive dissection of the right groin given previous scar and ultimately identified two profunda branches that were preserved as noted on CT.  Endarterectomy was performed with eversion endarterectomy of each profunda branch and good back bleeding.  There was extensive plaque and calcification in the  right common femoral artery that was completely occluded.  I attempted to cross the right external iliac occluded stents retrograde from the right groin but was in a subintimal plane.  Ultimately I did access the left common femoral artery crossed the aortic bifurcation and snared my wire in the true lumen from retrograde right femoral approach.  Over this wire I then placed a 8 x 59 VBX in the right common iliac artery and two 7 mm x 50 mm Viabahn's in the right external iliac.  A bovine pericardial patch was sewn to the right common femoral artery.  Excellent right femoral pulse at completion with Doppler flow at the right PT.  Anesthesia: General  Details: Patient was taken to the operating room after informed consent was obtained.  Placed on table in supine position.  General tracheal anesthesia was induced.  Timeout was performed.  Antibiotics were given.  Initially made a horizontal incision in the right groin and dissected down opening the subcutaneous tissue with Bovie cautery until I encountered the femoral sheath that was opened longitudinally.  Extensive dissection was performed to identify the common femoral artery as well as the hood of his previous common femoral bypass.  This ultimately involved extensively dissection of the femoral vein off the artery as well as identifying two profunda branches.  I used a fixed retractor with a balfour for visualization under the inguinal ligament given her obesity.  Ultimately was able to get Vesseloops around each profunda branch and I ultimately disconnected out the old bypass after it was ligated on each end with 2-0 silks in the groin (it was already occluded) so I could visualize the common femoral and  I was able to dissect under the inguinal ligament to get controlled circumflex branches.  That point time patient was given 100 units/kg heparin and ACT was checked to maintain greater than 250.  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  There was no flow in the common femoral artery with subacute to chronic thrombus and significant calcification.  All this was endarterectomized with a Runner, broadcasting/film/video and the profundas were endarterectomized with eversion technique.  Ultimately I carried this dissection all the way up under the inguinal ligament until I could identify the true lumen although there was no inflow.  I was then able to pass a 7 [place] sheath with a Bentson wire and a KMP catheter retrograde in the right external iliac artery to try and cross the occluded stents retrograde.  Unfortunately I got into a subintimal plane.  I then had to come from the contralateral groin after multiple attempts.  The left common femoral artery was evaluated with ultrasound it was patent and an image was saved.  Accessed left common femoral artery with a micro access needle placed a micro sheath and then got ultimately Bentson wire into the infrarenal aorta where I advanced an Omni Flush catheter.  Infrarenal aortogram with iliac arteriogram was performed.  I then crossed the aortic bifurcation with the Omni Flush catheter got a Bentson wire down the right common iliac into the right external iliac artery.  I then came retrograde from the right groin exposure through our 7 French sheath and was able to snare the wire that had been passes up and over from other groin.  I then passed a catheter over the snared wire and got up into the true lumen of the infrarenal aorta and exchanged for a long Glidewire advantage that point time I finished endarterectomy into the right distal external iliac around our wire once we had through and through access.  I then brought a bovine pericardial patch on the field given I could not find any vein in the leg.  This was then sewn in place with 5-0 Prolene parachute technique and ultimately brought out the wire and 7 French sheath through the side of the patch.   Just prior to completion of the patch we went ahead and performed retrograde imaging of the right iliac.  The right common iliac had a dissection and heavily diseased and right external iliac stents occluded.  I elected to place a 8 mm x 59 mm VBX in the right common iliac artery with excellent results and the dissection was treated with inline flow.  I then elected to treat the external iliac with two 7 mm x 50 mm Viabahn stents that were postdilated with 6 mm Mustang balloons.  At that point time I then had pulsatile inflow in the right groin and a final retrograde iliac arteriogram showed inline flow down both the right common and external iliac stents with no residual stenosis or dissection.  I then removed my catheters and wires while using a vessel loop for proximal control.  I completed the patch with 5-0 Prolene.  Ultimately everything was de-aired prior to completion.  We had fairly good hemostasis but did require a couple of additional patch sutures.  We had excellent triphasic flow down both profunda branches.  There was a palpable pulse in the right groin patch.  Patient was given 50 mg protamine for reversal.  I was able to identify a PT  signal at the right ankle where she had no signals preop.  At that point in time the groin was irrigated out Surgicel snow was used for hemostasis.  I closed the femoral sheath with 2-0 Vicryl running.  The right groin incision was closed in multiple layers of 2-0 Vicryl, 3-0 Vicryl, 4-0 Monocryl Dermabond.  Ultimately the left femoral sheath was pulled at the end of the case manual pressure was held for 20 minutes.  Taken up PACU in stable condition.  Complication: None  Condition: Stable  Cephus Shelling, MD [facility] [place] Office: [phone]   Cephus Shelling

## 2020-02-24 NOTE — Transfer of Care (Signed)
Immediate Anesthesia Transfer of Care Note  Patient: Katherine Williams  Procedure(s) Performed: RIGHT FEMORAL ENDARTERECTOMY WITH PROFUNDAPLASTY (Right Groin) ULTRASOUND GUIDANCE FOR VASCULAR ACCESS, LEFT FEMORAL ARTERY (Left Groin) PATCH ANGIOPLASTY OF RIGHT FEMROAL ARTERY USING XENSOURE BOVINE PERICARDIUM PATCH (Right Groin) REDO RIGHT GROIN EXPOSURE (Right Groin) ABDOMINAL AORTAGRAM WITH ILIAC ARTERIOGRAM (Bilateral Abdomen) RETROGRADE STENTING OF RIGHT COMMON AND EXTERNAL ILIAC ARTERIES (Right Groin)  Patient Location: PACU  Anesthesia Type:General  Level of Consciousness: drowsy and patient cooperative  Airway & Oxygen Therapy: Patient Spontanous Breathing and Patient connected to face mask oxygen  Post-op Assessment: Report given to RN and Post -op Vital signs reviewed and stable  Post vital signs: Reviewed and stable  Last Vitals:  Vitals Value Taken Time  BP 157/59 02/24/20 1251  Temp    Pulse 81 02/24/20 1253  Resp 22 02/24/20 1253  SpO2 98 % 02/24/20 1253  Vitals shown include unvalidated device data.  Last Pain:  Vitals:   02/24/20 0305  TempSrc: Oral  PainSc:       Patients Stated Pain Goal: 3 (02/22/20 0015)  Complications: No complications documented.

## 2020-02-25 ENCOUNTER — Encounter (HOSPITAL_COMMUNITY): Payer: Self-pay | Admitting: Vascular Surgery

## 2020-02-25 DIAGNOSIS — I739 Peripheral vascular disease, unspecified: Secondary | ICD-10-CM | POA: Diagnosis not present

## 2020-02-25 LAB — CBC
HCT: 38.2 % (ref 36.0–46.0)
Hemoglobin: 12.4 g/dL (ref 12.0–15.0)
MCH: 25.2 pg — ABNORMAL LOW (ref 26.0–34.0)
MCHC: 32.5 g/dL (ref 30.0–36.0)
MCV: 77.5 fL — ABNORMAL LOW (ref 80.0–100.0)
Platelets: 318 10*3/uL (ref 150–400)
RBC: 4.93 MIL/uL (ref 3.87–5.11)
RDW: 15.5 % (ref 11.5–15.5)
WBC: 14.2 10*3/uL — ABNORMAL HIGH (ref 4.0–10.5)
nRBC: 0 % (ref 0.0–0.2)

## 2020-02-25 LAB — GLUCOSE, CAPILLARY
Glucose-Capillary: 167 mg/dL — ABNORMAL HIGH (ref 70–99)
Glucose-Capillary: 192 mg/dL — ABNORMAL HIGH (ref 70–99)
Glucose-Capillary: 183 mg/dL — ABNORMAL HIGH (ref 70–99)
Glucose-Capillary: 143 mg/dL — ABNORMAL HIGH (ref 70–99)
Glucose-Capillary: 149 mg/dL — ABNORMAL HIGH (ref 70–99)

## 2020-02-25 LAB — BASIC METABOLIC PANEL
Anion gap: 10 (ref 5–15)
BUN: 13 mg/dL (ref 8–23)
CO2: 25 mmol/L (ref 22–32)
Calcium: 8.6 mg/dL — ABNORMAL LOW (ref 8.9–10.3)
Chloride: 101 mmol/L (ref 98–111)
Creatinine, Ser: 1.11 mg/dL — ABNORMAL HIGH (ref 0.44–1.00)
GFR, Estimated: 54 mL/min — ABNORMAL LOW (ref >60)
Glucose, Bld: 143 mg/dL — ABNORMAL HIGH (ref 70–99)
Potassium: 4.2 mmol/L (ref 3.5–5.1)
Sodium: 136 mmol/L (ref 135–145)

## 2020-02-25 LAB — LIPID PANEL
Cholesterol: 149 mg/dL (ref 0–200)
HDL: 54 mg/dL (ref >40)
LDL Cholesterol: 75 mg/dL (ref 0–99)
Total CHOL/HDL Ratio: 2.8 RATIO
Triglycerides: 99 mg/dL (ref <150)
VLDL: 20 mg/dL (ref 0–40)

## 2020-02-25 MED ORDER — ROSUVASTATIN CALCIUM 20 MG PO TABS
40.0000 mg | ORAL_TABLET | Freq: Every day | ORAL | Status: DC
Start: 2020-02-25 — End: 2020-02-27
  Administered 2020-02-25 – 2020-02-26 (×2): 40 mg via ORAL
  Filled 2020-02-25 (×2): qty 2

## 2020-02-25 NOTE — Progress Notes (Addendum)
  Progress Note    02/25/2020 7:39 AM 1 Day Post-Op  Subjective:  R foot feels better compared to before surgery   Vitals:   02/24/20 1900 02/24/20 2309  BP: (!) 174/77 (!) 153/55  Pulse: 67 78  Resp: 17 17  Temp: 98.1 F (36.7 C) 98.7 F (37.1 C)  SpO2: 98% 93%   Physical Exam: Lungs:  Non labored Incisions:  R groin incision c/d/i without hematoma Extremities:  R ATA and PTA by doppler Neurologic: A&O  CBC    Component Value Date/Time   WBC 14.2 (H) 02/25/2020 0504   RBC 4.93 02/25/2020 0504   HGB 12.4 02/25/2020 0504   HCT 38.2 02/25/2020 0504   PLT 318 02/25/2020 0504   MCV 77.5 (L) 02/25/2020 0504   MCH 25.2 (L) 02/25/2020 0504   MCHC 32.5 02/25/2020 0504   RDW 15.5 02/25/2020 0504   LYMPHSABS 1.3 10/25/2016 2348   MONOABS 0.8 10/25/2016 2348   EOSABS 0.3 10/25/2016 2348   BASOSABS 0.0 10/25/2016 2348    BMET    Component Value Date/Time   NA 136 02/25/2020 0504   K 4.2 02/25/2020 0504   CL 101 02/25/2020 0504   CO2 25 02/25/2020 0504   GLUCOSE 143 (H) 02/25/2020 0504   BUN 13 02/25/2020 0504   CREATININE 1.11 (H) 02/25/2020 0504   CREATININE 0.71 04/30/2015 1259   CALCIUM 8.6 (L) 02/25/2020 0504   GFRNONAA 54 (L) 02/25/2020 0504   GFRAA >60 10/25/2016 2348    INR    Component Value Date/Time   INR 1.00 04/30/2015 1259     Intake/Output Summary (Last 24 hours) at 02/25/2020 0739 Last data filed at 02/24/2020 1600 Gross per 24 hour  Intake 850 ml  Output 550 ml  Net 300 ml     Assessment/Plan:  70 y.o. female is s/p R CFA endarterectomy with common and external iliac artery stenting 1 Day Post-Op   No rest pain R foot overnight; R foot well perfused with ATA and PTA by doppler 4x4 gauze placed in R groin, no tape; change daily OOB today Possibly home tomorrow   Emilie Rutter, PA-C [facility] [phone] 02/25/2020 7:39 AM  I have seen and evaluated the patient. I agree with the PA note as documented  above.  70 year old female postop day 1 status post redo right common femoral endarterectomy with retrograde stenting of the right common and external iliac artery that required snaring access from the left groin.  Her right groin looks good.  She has DP PT Doppler signals in the right foot.  Left groin transfemoral access site also looks good.  Please continue aspirin Plavix with dual antiplatelet therapy.  She can mobilize today.  Hgb 12.4.   Cephus Shelling, MD [facility] [place] Office: [phone]

## 2020-02-25 NOTE — Progress Notes (Signed)
Occupational Therapy Evaluation Patient Details Name: Katherine Williams MRN: [id number] DOB: November 23, 1950 Today's Date: 02/25/2020    History of Present Illness 70 year old female with PMH significant for PVD, HTN, DM 2 on insulin pump and ongoing tobacco use is status post bilateral superficial femoropopliteal bypass with stent placement was readmitted on 02/19/2020 due to numbness and tingling in her right lower extremity.  Work-up revealed no flow at the right ankle and she was found to have occluded right external iliac stent and common femoral artery.  Pt is s/p R CFA endarterectomy with common and external iliac artery stenting on 1/10.   Clinical Impression   PTA pt lives alone @ modified independent level with use of rollator. Pt states LB ADL tasks are difficult at baseline and she has difficulty with cooking/cleaning due to poor activity tolerance. Began education on energy conservation and availability of AE to help with LB ADL tasks. Will follow acutely to facilitate safe DC home and maximize functional level of independence. Feel pt would benefit from [facility] however unsure if pt will agree due to being concerned about Covid.     Follow Up Recommendations  Home health OT;Supervision - Intermittent (if agreeable)    Equipment Recommendations  None recommended by OT    Recommendations for Other Services       Precautions / Restrictions Precautions Precautions: Fall Restrictions Weight Bearing Restrictions: No      Mobility Bed Mobility Overal bed mobility: Modified Independent                  Transfers Overall transfer level: Needs assistance Equipment used: Rolling walker (2 wheeled) Transfers: Sit to/from Stand Sit to Stand: Supervision              Balance Overall balance assessment: Needs assistance Sitting-balance support: No upper extremity supported;Feet supported Sitting balance-Leahy Scale: Good     Standing balance support: Bilateral upper extremity  supported;During functional activity Standing balance-Leahy Scale: Poor Standing balance comment: relies on UE support for balance                           ADL either performed or assessed with clinical judgement   ADL Overall ADL's : Needs assistance/impaired     Grooming: Set up;Sitting   Upper Body Bathing: Set up;Sitting   Lower Body Bathing: Min guard;Sit to/from stand Lower Body Bathing Details (indicate cue type and reason): uses long handled sponge at baseline Upper Body Dressing : Set up;Sitting   Lower Body Dressing: Minimal assistance Lower Body Dressing Details (indicate cue type and reason): putting on underwear and socks is difficult at baseline Toilet Transfer: Supervision/safety;Ambulation   Toileting- Clothing Manipulation and Hygiene: Supervision/safety       Functional mobility during ADLs: Supervision/safety;Rolling walker General ADL Comments: Began education on availability of AE for LB ADL adn use of energy conservation strategies; uses long handled brush to help with bathing; will educate further on sock aid and reacher; May discuss peri-care and AE if needed.     Vision         Perception     Praxis      Pertinent Vitals/Pain Pain Assessment: Faces Faces Pain Scale: Hurts little more Pain Location: groin Pain Descriptors / Indicators: Aching;Grimacing;Guarding Pain Intervention(s): Limited activity within patient's tolerance     Hand Dominance Right   Extremity/Trunk Assessment Upper Extremity Assessment Upper Extremity Assessment: RUE deficits/detail;LUE deficits/detail RUE Deficits / Details: bone spurs B shoulders- @ 90  degrees but functional   Lower Extremity Assessment Lower Extremity Assessment: Defer to PT evaluation   Cervical / Trunk Assessment Cervical / Trunk Assessment: Normal   Communication Communication Communication: No difficulties   Cognition Arousal/Alertness: Awake/alert Behavior During Therapy:  WFL for tasks assessed/performed Overall Cognitive Status: Within Functional Limits for tasks assessed                                     General Comments  VSS    Exercises Exercises: Other exercises Other Exercises Other Exercises: reviewed pursed lip breathing   Shoulder Instructions      Home Living Family/patient expects to be discharged to:: Private residence Living Arrangements: Alone Available Help at Discharge: Family;Available PRN/intermittently Type of Home: House Home Access: Level entry     Home Layout: One level     Bathroom Shower/Tub: Producer, television/film/video: Standard Bathroom Accessibility: Yes How Accessible: Accessible via walker Home Equipment: Walker - 4 wheels;Shower seat;Grab bars - tub/shower          Prior Functioning/Environment Level of Independence: Independent with assistive device(s)        Comments: used scooters at times and rollator especially when out; drives        OT Problem List: Decreased activity tolerance;Impaired balance (sitting and/or standing);Decreased knowledge of use of DME or AE;Cardiopulmonary status limiting activity;Obesity;Pain      OT Treatment/Interventions: Self-care/ADL training;Therapeutic exercise;Energy conservation;DME and/or AE instruction;Therapeutic activities;Patient/family education    OT Goals(Current goals can be found in the care plan section) Acute Rehab OT Goals Patient Stated Goal: to go home OT Goal Formulation: With patient Time For Goal Achievement: 03/10/20 Potential to Achieve Goals: Good  OT Frequency: Min 2X/week   Barriers to D/C:            Co-evaluation              AM-PAC OT "6 Clicks" Daily Activity     Outcome Measure Help from another person eating meals?: None Help from another person taking care of personal grooming?: A Little Help from another person toileting, which includes using toliet, bedpan, or urinal?: A Little Help from  another person bathing (including washing, rinsing, drying)?: A Little Help from another person to put on and taking off regular upper body clothing?: A Little Help from another person to put on and taking off regular lower body clothing?: A Little 6 Click Score: 19   End of Session Equipment Utilized During Treatment: Rolling walker Nurse Communication: Mobility status  Activity Tolerance: Patient tolerated treatment well Patient left: in bed;with call bell/phone within reach  OT Visit Diagnosis: Unsteadiness on feet (R26.81);Pain Pain - part of body:  (groin)                Time: 1325-1350 OT Time Calculation (min): 25 min Charges:  OT General Charges $OT Visit: 1 Visit OT Evaluation $OT Eval Low Complexity: 1 Low OT Treatments $Self Care/Home Management : 8-22 mins  Luisa Dago, OT/L   Acute OT Clinical Specialist Acute Rehabilitation Services Pager [phone] Office [phone]   [facility] 02/25/2020, 2:00 PM

## 2020-02-25 NOTE — Progress Notes (Signed)
Inpatient Diabetes Program Recommendations  AACE/ADA: New Consensus Statement on Inpatient Glycemic Control (2015)  Target Ranges:  Prepandial:   less than 140 mg/dL      Peak postprandial:   less than 180 mg/dL (1-2 hours)      Critically ill patients:  140 - 180 mg/dL   Lab Results  Component Value Date   GLUCAP 183 (H) 02/25/2020   HGBA1C 8.0 (H) 02/19/2020    Spoke with patient on the phone as DM coordinator is working remotely.  Wanted to discuss use of CGM as she was having lows prior to hospitalization.  She uses a Medtronic insulin pump.  She states she also wears the Medtronic CGM as well as the [employer] 14 day.  Discussed lows with her and she says it usually happens overnight.  She will get as low as 40 mg/dL.  She will wake up with symptoms.  Asked if her alarms are set on her CGM; she states the Medtronic alarm is set.  She states the [employer] won't alarm unless she checks a blood sugar with a finger stick.  She admits to turning her pump off if HS CBG is <130.  By doing this her sensor will not alarm.  Explained this to patient.  She likely needs overnight adjustments and asked her to reach out to Dr. Margo Aye and keep him updated on blood sugars so he can ajust.   Will continue to follow while inpatient.  Thank you, Dulce Sellar, RN, BSN Diabetes Coordinator Inpatient Diabetes Program [phone] (team pager from 8a-5p)

## 2020-02-25 NOTE — Care Management Important Message (Signed)
Important Message  Patient Details  Name: Katherine Williams MRN: [id number] Date of Birth: 11-21-1950   Medicare Important Message Given:  Yes     Renie Ora 02/25/2020, 9:28 AM

## 2020-02-25 NOTE — Plan of Care (Signed)
Pt. Is alert and oriented. C/o pain 7/10 this shift. PRN pain medication given with effective results. She has ambulated with PT and sat up in chair. Foley removed at 0700. Due to void at this time. She still has poor appetite. She is calm and cooperative. NSR on monitor. Surgical incision sites are clean and dry, no drainage. Brynda Rim, RN

## 2020-02-25 NOTE — Evaluation (Signed)
Physical Therapy Evaluation Patient Details Name: Katherine Williams MRN: [id number] DOB: 1950-03-01 Today's Date: 02/25/2020   History of Present Illness  70 year old female with PMH significant for PVD, HTN, DM 2 on insulin pump and ongoing tobacco use is status post bilateral superficial femoropopliteal bypass with stent placement was readmitted on 02/19/2020 due to numbness and tingling in her right lower extremity.  Work-up revealed no flow at the right ankle and she was found to have occluded right external iliac stent and common femoral artery.  Pt is s/p R CFA endarterectomy with common and external iliac artery stenting on 1/10.  Clinical Impression  Pt admitted with above diagnosis. Pt was able to ambulate with RW in room with overall good safety.  Pt has rollator at home and will use that initially. Pt should progress well. Pt reports difficulty in stores due to COPD therefore recommend wheelchair.  Will follow acutely.  Pt currently with functional limitations due to the deficits listed below (see PT Problem List). Pt will benefit from skilled PT to increase their independence and safety with mobility to allow discharge to the venue listed below.      Follow Up Recommendations Home health PT;Supervision - Intermittent    Equipment Recommendations  Wheelchair (18x16 lightweight wheelchair with desk armrests, anti tippers and elevating legrests);Wheelchair cushion (18x16 pressure relieving cushion)    Recommendations for Other Services       Precautions / Restrictions Precautions Precautions: Fall Restrictions Weight Bearing Restrictions: No      Mobility  Bed Mobility Overal bed mobility: Needs Assistance Bed Mobility: Supine to Sit     Supine to sit: Min guard     General bed mobility comments: Incr time to come to EOB.    Transfers Overall transfer level: Needs assistance Equipment used: Rolling walker (2 wheeled) Transfers: Sit to/from Stand Sit to Stand: Min guard          General transfer comment: cues for hand placement and steadying assist with initial stand.  Ambulation/Gait Ambulation/Gait assistance: Min guard Gait Distance (Feet): 45 Feet Assistive device: Rolling walker (2 wheeled) Gait Pattern/deviations: Step-through pattern;Decreased stride length;Antalgic;Wide base of support;Trunk flexed   Gait velocity interpretation: <1.31 ft/sec, indicative of household ambulator General Gait Details: needed cues to stand tall as pt flexed slightly unless cued. Cues to stay close to RW as well. Pt able to make it to door and back although was fatigued. Pt is safe with RW.  Stairs            Wheelchair Mobility    Modified Rankin (Stroke Patients Only)       Balance Overall balance assessment: Needs assistance Sitting-balance support: No upper extremity supported;Feet supported Sitting balance-Leahy Scale: Fair     Standing balance support: Bilateral upper extremity supported;During functional activity Standing balance-Leahy Scale: Poor Standing balance comment: relies on UE support for balance                             Pertinent Vitals/Pain Pain Assessment: Faces Faces Pain Scale: Hurts little more Pain Location: groin Pain Descriptors / Indicators: Aching;Grimacing;Guarding Pain Intervention(s): Limited activity within patient's tolerance;Monitored during session;Repositioned    Home Living Family/patient expects to be discharged to:: Private residence Living Arrangements: Alone Available Help at Discharge: Family;Available PRN/intermittently Type of Home: House Home Access: Level entry     Home Layout: One level Home Equipment: Walker - 4 wheels;Shower seat      Prior Function Level  of Independence: Independent with assistive device(s)         Comments: used scooters at times and rollator especially when out     Hand Dominance   Dominant Hand: Right    Extremity/Trunk Assessment   Upper  Extremity Assessment Upper Extremity Assessment: Defer to OT evaluation    Lower Extremity Assessment Lower Extremity Assessment: Generalized weakness    Cervical / Trunk Assessment Cervical / Trunk Assessment: Normal  Communication   Communication: No difficulties  Cognition Arousal/Alertness: Awake/alert Behavior During Therapy: WFL for tasks assessed/performed Overall Cognitive Status: Within Functional Limits for tasks assessed                                        General Comments General comments (skin integrity, edema, etc.): 77 bpm, 99% RA    Exercises     Assessment/Plan    PT Assessment Patient needs continued PT services  PT Problem List Decreased activity tolerance;Decreased balance;Decreased mobility;Decreased knowledge of use of DME;Decreased safety awareness;Decreased knowledge of precautions       PT Treatment Interventions DME instruction;Gait training;Therapeutic activities;Therapeutic exercise;Balance training;Patient/family education;Functional mobility training    PT Goals (Current goals can be found in the Care Plan section)  Acute Rehab PT Goals Patient Stated Goal: to go home PT Goal Formulation: With patient Time For Goal Achievement: 03/10/20 Potential to Achieve Goals: Good    Frequency Min 3X/week   Barriers to discharge        Co-evaluation               AM-PAC PT "6 Clicks" Mobility  Outcome Measure Help needed turning from your back to your side while in a flat bed without using bedrails?: None Help needed moving from lying on your back to sitting on the side of a flat bed without using bedrails?: A Little Help needed moving to and from a bed to a chair (including a wheelchair)?: A Little Help needed standing up from a chair using your arms (e.g., wheelchair or bedside chair)?: A Little Help needed to walk in hospital room?: A Little Help needed climbing 3-5 steps with a railing? : A Little 6 Click Score:  19    End of Session Equipment Utilized During Treatment: Gait belt Activity Tolerance: Patient limited by fatigue Patient left: with call bell/phone within reach;in chair Nurse Communication: Mobility status PT Visit Diagnosis: Muscle weakness (generalized) (M62.81)    Time: 3007-6226 PT Time Calculation (min) (ACUTE ONLY): 19 min   Charges:   PT Evaluation $PT Eval Moderate Complexity: 1 Mod          Storm Sovine W,PT Acute Rehabilitation Services Pager:  [phone]  Office:  [phone]    Berline Lopes 02/25/2020, 11:38 AM

## 2020-02-25 NOTE — Progress Notes (Signed)
Mobility Specialist: Progress Note   02/25/20 1456  Mobility  Activity Ambulated in room  Level of Assistance Modified independent, requires aide device or extra time  Assistive Device Front wheel walker  Distance Ambulated (ft) 112 ft (56'x2)  Mobility Response Tolerated well  Mobility performed by Mobility specialist  Bed Position Chair  $Mobility charge 1 Mobility   Pre-Mobility: 63 HR, 97% SpO2 Post-Mobility: 68 HR, 139/61 BP, 96% SpO2  Pt c/o 4/10 pain in her R groin during ambulation. Pt to chair after ambulation with call bell.   [facility] Mobility Specialist

## 2020-02-25 NOTE — Progress Notes (Signed)
PHARMACIST LIPID MONITORING   Katherine Williams is a 70 y.o. female admitted on 02/19/2020 with PVD.  Pharmacy has been consulted to optimize lipid-lowering therapy with the indication of secondary prevention for clinical ASCVD.  Recent Labs:  Lipid Panel (last 6 months):   Lab Results  Component Value Date   CHOL 149 02/25/2020   TRIG 99 02/25/2020   HDL 54 02/25/2020   CHOLHDL 2.8 02/25/2020   VLDL 20 02/25/2020   LDLCALC 75 02/25/2020    Hepatic function panel (last 6 months):   Lab Results  Component Value Date   AST 25 02/23/2020   ALT 21 02/23/2020   ALKPHOS 43 02/23/2020   BILITOT 0.6 02/23/2020    SCr (since admission):   Serum creatinine: 1.11 mg/dL (H) 22/48/25 0037 Estimated creatinine clearance: 55.7 mL/min (A)  Current lipid-lowering therapy: crestor 10mg /d Previous lipid-lowering therapies (if applicable): none Documented or reported allergies or intolerances to lipid-lowering therapies (if applicable): nonee  Assessment:  Patient agrees with changes to lipid-lowering therapy  Recommendation per protocol:  Increase intensity or dose of current statin.  Follow-up with:  Primary care provider - , MD  Follow-up labs after discharge:    Liver function panel and lipid panel in 8-12 weeks then annually  Plan: -Increase crestor to 40mg  daily  10-12, PharmD Clinical Pharmacist **Pharmacist phone directory can now be found on [email or web address] ([email or web address]).  Listed under [facility].

## 2020-02-25 NOTE — Anesthesia Postprocedure Evaluation (Signed)
Anesthesia Post Note  Patient: Katherine Williams  Procedure(s) Performed: RIGHT FEMORAL ENDARTERECTOMY WITH PROFUNDAPLASTY (Right Groin) ULTRASOUND GUIDANCE FOR VASCULAR ACCESS, LEFT FEMORAL ARTERY (Left Groin) PATCH ANGIOPLASTY OF RIGHT FEMROAL ARTERY USING XENSOURE BOVINE PERICARDIUM PATCH (Right Groin) REDO RIGHT GROIN EXPOSURE (Right Groin) ABDOMINAL AORTAGRAM WITH ILIAC ARTERIOGRAM (Bilateral Abdomen) RETROGRADE STENTING OF RIGHT COMMON AND EXTERNAL ILIAC ARTERIES (Right Groin)     Patient location during evaluation: PACU Anesthesia Type: General Level of consciousness: awake and alert Pain management: pain level controlled Vital Signs Assessment: post-procedure vital signs reviewed and stable Respiratory status: spontaneous breathing, nonlabored ventilation, respiratory function stable and patient connected to nasal cannula oxygen Cardiovascular status: blood pressure returned to baseline and stable Postop Assessment: no apparent nausea or vomiting Anesthetic complications: no   No complications documented.  Last Vitals:  Vitals:   02/25/20 0802 02/25/20 0829  BP: 103/84 120/60  Pulse: 65   Resp: 16   Temp: 36.8 C   SpO2: 95%     Last Pain:  Vitals:   02/25/20 0802  TempSrc: Oral  PainSc:                  Bain Whichard S

## 2020-02-25 NOTE — Progress Notes (Signed)
PROGRESS NOTE    Katherine Williams  NUU:[id number] DOB: [birth year] DOA: 02/19/2020 PCP: Benita Stabile, MD   Chief Complaint  Patient presents with  . Numbness  . Foot Pain  Brief Narrative: 70 year old female with complex comorbidities with PVD, hypertension, T2DM on insulin pump, ongoing tobacco abuse status post bilateral superficial femoral-popliteal bypass and stent placement was readmitted 02/19/2020 due to numbness and tingling in her lower extremity.  Work-up in the ED showed no flow at the right ankle and was found to have occluded right external iliac stent and common femoral artery, seen by vascular surgery and underwent  Redo right common femoral endarterectomy with retrograde stenting of the right common and external iliac artery that required snaring access from left groin 02/24/20  Subjective:  Afebrile overnight hemoglobin stable, c reat 1.1, leukocytosis at 14.2k she just worked with physical therapy, resting on bedside chair Assessment & Plan:  PVD Redo right common femoral endarterectomy with retrograde stenting of the right common and external iliac artery that required snaring access from left groin 02/24/20.  Hemoglobin is stable, vascular following, continue DAPT with aspirin/Plavix, Crestor.  Okay to mobilize today.  diabetes mellitus on insulin pump, DM coordinator following, fairly stable hemoglobin A1c 8.0, blood sugar well controlled, continue to monitor with Lantus 12 units and sliding scale insulin Recent Labs  Lab 02/24/20 1250 02/24/20 1651 02/24/20 2001 02/24/20 2308 02/25/20 0405  GLUCAP 191* 209* 175* 204* 167*   Lab Results  Component Value Date   HGBA1C 8.0 (H) 02/19/2020   Hypertension/Mixed hyperlipidemia: BP stable on amlodipine, Coreg.  HCTZ lisinopril on hold. continue Crestor  Current smoker: Smoking cessation counseling, nicotine patch, bupropion  GERD: Continue PPI  Hypokalemia: Resolved  Chronic diastolic heart failure: Compensated  currently euvolemic.  Morbid obesity with BMI 38, will benefit with weight loss PCP follow-up  Nutrition: Diet Order            Diet heart healthy/carb modified Room service appropriate? Yes; Fluid consistency: Thin  Diet effective now                 Body mass index is 38.62 kg/m.  DVT prophylaxis: heparin injection 5,000 Units Start: 02/25/20 0600 SCD's Start: 02/24/20 1458 Code Status:   Code Status: Full Code  Family Communication: plan of care discussed with patient at bedside.  Status is: Inpatient  Remains inpatient appropriate because:Inpatient level of care appropriate due to severity of illness   Dispo: The patient is from: Home              Anticipated d/c is to: tbd              Anticipated d/c date is: 1 day              Patient currently is not medically stable to d/c. Consultants:see note  Procedures:see note  Culture/Microbiology No results found for: SDES, SPECREQUEST, CULT, REPTSTATUS  Other culture-see note  Medications: Scheduled Meds: . amLODipine  10 mg Oral Daily  . aspirin EC  81 mg Oral Daily  . buPROPion  150 mg Oral BID  . carvedilol  25 mg Oral BID  . Chlorhexidine Gluconate Cloth  6 each Topical Daily  . clopidogrel  75 mg Oral Q0600  . docusate sodium  100 mg Oral Daily  . heparin  5,000 Units Subcutaneous Q8H  . hydrALAZINE  25 mg Oral Q8H  . insulin aspart  0-15 Units Subcutaneous Q4H  . insulin detemir  12 Units Subcutaneous  BID  . mupirocin ointment  1 application Nasal BID  . nicotine  21 mg Transdermal Daily  . pantoprazole  40 mg Oral BID AC  . rosuvastatin  10 mg Oral QHS   Continuous Infusions: . sodium chloride    . sodium chloride 100 mL/hr at 02/25/20 0037  . magnesium sulfate bolus IVPB      Antimicrobials: Anti-infectives (From admission, onward)   Start     Dose/Rate Route Frequency Ordered Stop   02/25/20 0000  vancomycin (VANCOCIN) IVPB 1000 mg/200 mL premix        1,000 mg 200 mL/hr over 60 Minutes  Intravenous Every 24 hours 02/24/20 1829 02/25/20 0141   02/24/20 2000  vancomycin (VANCOCIN) IVPB 1000 mg/200 mL premix  Status:  Discontinued        1,000 mg 200 mL/hr over 60 Minutes Intravenous Every 12 hours 02/24/20 1458 02/24/20 1829   02/24/20 0000  vancomycin (VANCOCIN) IVPB 1000 mg/200 mL premix        1,000 mg 200 mL/hr over 60 Minutes Intravenous To Surgery 02/23/20 0916 02/24/20 0801     Objective: Vitals: Today's Vitals   02/25/20 0145 02/25/20 0501 02/25/20 0802 02/25/20 0829  BP:   103/84 120/60  Pulse:   65   Resp:   16   Temp:   98.3 F (36.8 C)   TempSrc:   Oral   SpO2:   95%   Weight:      Height:      PainSc: Asleep 6       Intake/Output Summary (Last 24 hours) at 02/25/2020 0831 Last data filed at 02/24/2020 1600 Gross per 24 hour  Intake 850 ml  Output 550 ml  Net 300 ml   Filed Weights   02/19/20 1529  Weight: 102.1 kg   Weight change:   Intake/Output from previous day: 01/10 0701 - 01/11 0700 In: 850 [I.V.:850] Out: 550 [Urine:400; Blood:150] Intake/Output this shift: No intake/output data recorded.  Examination: General exam: AAOx3 ,NAD, weak appearing. HEENT:Oral mucosa moist, Ear/Nose WNL grossly,dentition normal. Respiratory system: bilaterally clear,no wheezing or crackles,no use of accessory muscle, non tender. Cardiovascular system: S1 & S2 +, regular, No JVD. Gastrointestinal system: Abdomen soft, NT,ND, BS+. Nervous System:Alert, awake, moving extremities and grossly nonfocal Extremities: No edema, distal peripheral pulses palpable.  Skin: No rashes,no icterus. MSK: Normal muscle bulk,tone, power  Data Reviewed: I have personally reviewed following labs and imaging studies CBC: Recent Labs  Lab 02/22/20 0216 02/23/20 0228 02/24/20 0200 02/24/20 1402 02/25/20 0504  WBC 9.4 8.8 9.4 11.3* 14.2*  HGB 11.5* 11.0* 11.5* 11.5* 12.4  HCT 37.0 36.1 37.0 37.7 38.2  MCV 79.4* 79.2* 78.7* 79.2* 77.5*  PLT 315 333 330 314 318    Basic Metabolic Panel: Recent Labs  Lab 02/19/20 1944 02/20/20 0147 02/21/20 0512 02/22/20 0216 02/23/20 0228 02/24/20 0200 02/24/20 1402 02/25/20 0504  NA 136   < > 139 138 137 138  --  136  K 2.6*   < > 3.7 3.6 3.5 3.9  --  4.2  CL 94*   < > 102 102 101 101  --  101  CO2 31   < > 27 26 26 27   --  25  GLUCOSE 164*   < > 110* 136* 157* 110*  --  143*  BUN 13   < > 12 9 10 8   --  13  CREATININE 0.82   < > 0.81 0.81 0.94 0.88 1.24* 1.11*  CALCIUM 9.1   < >  9.0 8.9 8.9 9.2  --  8.6*  MG 1.8  --   --  1.9  --   --   --   --   PHOS  --   --   --  2.9  --   --   --   --    < > = values in this interval not displayed.   GFR: Estimated Creatinine Clearance: 55.7 mL/min (A) (by C-G formula based on SCr of 1.11 mg/dL (H)). Liver Function Tests: Recent Labs  Lab 02/23/20 0228  AST 25  ALT 21  ALKPHOS 43  BILITOT 0.6  PROT 6.4*  ALBUMIN 2.9*   No results for input(s): LIPASE, AMYLASE in the last 168 hours. No results for input(s): AMMONIA in the last 168 hours. Coagulation Profile: No results for input(s): INR, PROTIME in the last 168 hours. Cardiac Enzymes: No results for input(s): CKTOTAL, CKMB, CKMBINDEX, TROPONINI in the last 168 hours. BNP (last 3 results) No results for input(s): PROBNP in the last 8760 hours. HbA1C: No results for input(s): HGBA1C in the last 72 hours. CBG: Recent Labs  Lab 02/24/20 1250 02/24/20 1651 02/24/20 2001 02/24/20 2308 02/25/20 0405  GLUCAP 191* 209* 175* 204* 167*   Lipid Profile: Recent Labs    02/25/20 0504  CHOL 149  HDL 54  LDLCALC 75  TRIG 99  CHOLHDL 2.8   Thyroid Function Tests: No results for input(s): TSH, T4TOTAL, FREET4, T3FREE, THYROIDAB in the last 72 hours. Anemia Panel: Recent Labs    02/23/20 0228  FERRITIN 135   Sepsis Labs: No results for input(s): PROCALCITON, LATICACIDVEN in the last 168 hours.  Recent Results (from the past 240 hour(s))  Resp Panel by RT-PCR (Flu A&B, Covid) Nasopharyngeal  Swab     Status: None   Collection Time: 02/19/20  9:37 PM   Specimen: Nasopharyngeal Swab; Nasopharyngeal(NP) swabs in vial transport medium  Result Value Ref Range Status   SARS Coronavirus 2 by RT PCR NEGATIVE NEGATIVE Final    Comment: (NOTE) SARS-CoV-2 target nucleic acids are NOT DETECTED.  The SARS-CoV-2 RNA is generally detectable in upper respiratory specimens during the acute phase of infection. The lowest concentration of SARS-CoV-2 viral copies this assay can detect is 138 copies/mL. A negative result does not preclude SARS-Cov-2 infection and should not be used as the sole basis for treatment or other patient management decisions. A negative result may occur with  improper specimen collection/handling, submission of specimen other than nasopharyngeal swab, presence of viral mutation(s) within the areas targeted by this assay, and inadequate number of viral copies(<138 copies/mL). A negative result must be combined with clinical observations, patient history, and epidemiological information. The expected result is Negative.  Fact Sheet for Patients:  BloggerCourse.com  Fact Sheet for Healthcare Providers:  SeriousBroker.it  This test is no t yet approved or cleared by the [place] FDA and  has been authorized for detection and/or diagnosis of SARS-CoV-2 by FDA under an Emergency Use Authorization (EUA). This EUA will remain  in effect (meaning this test can be used) for the duration of the COVID-19 declaration under Section 564(b)(1) of the Act, 21 U.S.C.section 360bbb-3(b)(1), unless the authorization is terminated  or revoked sooner.       Influenza A by PCR NEGATIVE NEGATIVE Final   Influenza B by PCR NEGATIVE NEGATIVE Final    Comment: (NOTE) The Xpert Xpress SARS-CoV-2/FLU/RSV plus assay is intended as an aid in the diagnosis of influenza from Nasopharyngeal swab specimens and should not  be used as a sole  basis for treatment. Nasal washings and aspirates are unacceptable for Xpert Xpress SARS-CoV-2/FLU/RSV testing.  Fact Sheet for Patients: BloggerCourse.com  Fact Sheet for Healthcare Providers: SeriousBroker.it  This test is not yet approved or cleared by the [place] FDA and has been authorized for detection and/or diagnosis of SARS-CoV-2 by FDA under an Emergency Use Authorization (EUA). This EUA will remain in effect (meaning this test can be used) for the duration of the COVID-19 declaration under Section 564(b)(1) of the Act, 21 U.S.C. section 360bbb-3(b)(1), unless the authorization is terminated or revoked.  Performed at [facility], [address], [place], [place] [address]   Surgical PCR screen     Status: None   Collection Time: 02/24/20  2:45 AM   Specimen: Nasal Mucosa; Nasal Swab  Result Value Ref Range Status   MRSA, PCR NEGATIVE NEGATIVE Final   Staphylococcus aureus NEGATIVE NEGATIVE Final    Comment: (NOTE) The Xpert SA Assay (FDA approved for NASAL specimens in patients 54 years of age and older), is one component of a comprehensive surveillance program. It is not intended to diagnose infection nor to guide or monitor treatment. Performed at [facility], [address] [place], [place] [address]      Radiology Studies: HYBRID OR IMAGING [facility] ONLY)  Result Date: 02/24/2020 There is no interpretation for this exam.  This order is for images obtained during a surgical procedure.  Please See "Surgeries" Tab for more information regarding the procedure.     LOS: 6 days   Lanae Boast, MD [facility]  02/25/2020, 8:31 AM

## 2020-02-26 DIAGNOSIS — I739 Peripheral vascular disease, unspecified: Secondary | ICD-10-CM | POA: Diagnosis not present

## 2020-02-26 LAB — BASIC METABOLIC PANEL
Anion gap: 9 (ref 5–15)
BUN: 14 mg/dL (ref 8–23)
CO2: 25 mmol/L (ref 22–32)
Calcium: 8.3 mg/dL — ABNORMAL LOW (ref 8.9–10.3)
Chloride: 102 mmol/L (ref 98–111)
Creatinine, Ser: 1.14 mg/dL — ABNORMAL HIGH (ref 0.44–1.00)
GFR, Estimated: 52 mL/min — ABNORMAL LOW (ref >60)
Glucose, Bld: 147 mg/dL — ABNORMAL HIGH (ref 70–99)
Potassium: 4.1 mmol/L (ref 3.5–5.1)
Sodium: 136 mmol/L (ref 135–145)

## 2020-02-26 LAB — CBC
HCT: 32.1 % — ABNORMAL LOW (ref 36.0–46.0)
Hemoglobin: 9.8 g/dL — ABNORMAL LOW (ref 12.0–15.0)
MCH: 24.6 pg — ABNORMAL LOW (ref 26.0–34.0)
MCHC: 30.5 g/dL (ref 30.0–36.0)
MCV: 80.5 fL (ref 80.0–100.0)
Platelets: 275 10*3/uL (ref 150–400)
RBC: 3.99 MIL/uL (ref 3.87–5.11)
RDW: 15.4 % (ref 11.5–15.5)
WBC: 13.9 10*3/uL — ABNORMAL HIGH (ref 4.0–10.5)
nRBC: 0 % (ref 0.0–0.2)

## 2020-02-26 LAB — GLUCOSE, CAPILLARY
Glucose-Capillary: 134 mg/dL — ABNORMAL HIGH (ref 70–99)
Glucose-Capillary: 133 mg/dL — ABNORMAL HIGH (ref 70–99)
Glucose-Capillary: 170 mg/dL — ABNORMAL HIGH (ref 70–99)
Glucose-Capillary: 130 mg/dL — ABNORMAL HIGH (ref 70–99)
Glucose-Capillary: 182 mg/dL — ABNORMAL HIGH (ref 70–99)
Glucose-Capillary: 215 mg/dL — ABNORMAL HIGH (ref 70–99)
Glucose-Capillary: 130 mg/dL — ABNORMAL HIGH (ref 70–99)

## 2020-02-26 MED ORDER — HYDROCHLOROTHIAZIDE 25 MG PO TABS
25.0000 mg | ORAL_TABLET | Freq: Every day | ORAL | Status: DC
  Administered 2020-02-27: 25 mg via ORAL
  Filled 2020-02-26: qty 1

## 2020-02-26 NOTE — Progress Notes (Signed)
Occupational Therapy Treatment Patient Details Name: Katherine Williams MRN: [id number] DOB: 11/06/1950 Today's Date: 02/26/2020    History of present illness 70-year-old female with PMH significant for PVD, HTN, DM 2 on insulin pump and ongoing tobacco use is status post bilateral superficial femoropopliteal bypass with stent placement was readmitted on 02/19/2020 due to numbness and tingling in her right lower extremity.  Work-up revealed no flow at the right ankle and she was found to have occluded right external iliac stent and common femoral artery.  Pt is s/p R CFA endarterectomy with common and external iliac artery stenting on 1/10.   OT comments  Pt pleasant and motivated for participation, continues to report preference is for d/c to home without HH consult despite being excellent candidate. Session with focus on safety training to maximize Indep and safety with completion of DME/AE retraining including use of long handled tools/hip kit, and ECT techniques to increase tolerance and safety. Pt with good carryover demo's only need for Sup for LB ADL's in sitting and standing with cues for use of tools and modifications. OT will continue to recommend HHOT for safety with plan for d/c to home and pending continued hospitalization, will follow acutely for more ADL retraining.    Follow Up Recommendations  Home health OT;Supervision - Intermittent    Equipment Recommendations  None recommended by OT    Recommendations for Other Services      Precautions / Restrictions Precautions Precautions: Fall       Mobility Bed Mobility                  Transfers       Sit to Stand: Supervision (cues for safety)         General transfer comment: cues for hand placement    Balance                                           ADL either performed or assessed with clinical judgement   ADL               Lower Body Bathing: Sit to/from stand;Min guard Lower  Body Bathing Details (indicate cue type and reason): with useoflong handled sponge/sock aide/reacher Upper Body Dressing : Set up;Sitting   Lower Body Dressing: Minimal assistance;Cueing for compensatory techniques;Cueing for sequencing   Toilet Transfer: Supervision/safety;Ambulation             General ADL Comments: reviewed at length and guided pt through training for use of long handled tools for increased indep and safety with LB dressing. good carry over and interest for increased ease and indep with ADL's     Vision       Perception     Praxis      Cognition Arousal/Alertness: Awake/alert Behavior During Therapy: WFL for tasks assessed/performed Overall Cognitive Status: Within Functional Limits for tasks assessed                                 General Comments: family on phone throughout session        Exercises     Shoulder Instructions       General Comments VSS on RA    Pertinent Vitals/ Pain       Pain Assessment: Faces Faces Pain Scale: Hurts little more Pain Location: groin   Pain Descriptors / Indicators: Aching;Grimacing;Guarding  Home Living                                          Prior Functioning/Environment              Frequency  Min 2X/week        Progress Toward Goals  OT Goals(current goals can now be found in the care plan section)  Progress towards OT goals: Progressing toward goals  Acute Rehab OT Goals Patient Stated Goal: to go home OT Goal Formulation: With patient Time For Goal Achievement: 03/10/20 Potential to Achieve Goals: Good  Plan Discharge plan remains appropriate    Co-evaluation                 AM-PAC OT "6 Clicks" Daily Activity     Outcome Measure   Help from another person eating meals?: None Help from another person taking care of personal grooming?: None Help from another person toileting, which includes using toliet, bedpan, or urinal?: A  Little Help from another person bathing (including washing, rinsing, drying)?: A Little Help from another person to put on and taking off regular upper body clothing?: None Help from another person to put on and taking off regular lower body clothing?: A Little 6 Click Score: 21    End of Session Equipment Utilized During Treatment: Rolling walker  OT Visit Diagnosis: Unsteadiness on feet (R26.81);Pain Pain - part of body: Leg   Activity Tolerance Patient tolerated treatment well   Patient Left in chair;with call bell/phone within reach   Nurse Communication Mobility status        Time: 7616-0737 OT Time Calculation (min): 31 min  Charges: OT General Charges $OT Visit: 1 Visit OT Treatments $Self Care/Home Management : 23-37 mins  Dayne Chait OTR/L acute rehab services Office: [phone]   Toula Moos Mikya Don 02/26/2020, 2:00 PM

## 2020-02-26 NOTE — Progress Notes (Addendum)
  Progress Note    02/26/2020 7:47 AM 2 Days Post-Op  Subjective:  No complaints this morning. States foot is feeling better. Able to tolerate ambulating with walker some yesterday   Vitals:   02/25/20 2300 02/26/20 0327  BP: 140/88 131/67  Pulse: 72 72  Resp: 15 16  Temp: 98.6 F (37 C) 98.6 F (37 C)  SpO2: 100% 100%   Physical Exam: Cardiac: regular Lungs: non labored Incisions: right groin incision is clean, dry and intact without swelling or hematoma. Dry gauze applied. Left femoral access site without swelling or hematoma Extremities:  R lower extremity is well perfused and warm with R AT/ PT by Doppler  Neurologic: Alert and Oriented  CBC    Component Value Date/Time   WBC 13.9 (H) 02/26/2020 0633   RBC 3.99 02/26/2020 0633   HGB 9.8 (L) 02/26/2020 0633   HCT 32.1 (L) 02/26/2020 0633   PLT 275 02/26/2020 0633   MCV 80.5 02/26/2020 0633   MCH 24.6 (L) 02/26/2020 0633   MCHC 30.5 02/26/2020 0633   RDW 15.4 02/26/2020 0633   LYMPHSABS 1.3 10/25/2016 2348   MONOABS 0.8 10/25/2016 2348   EOSABS 0.3 10/25/2016 2348   BASOSABS 0.0 10/25/2016 2348    BMET    Component Value Date/Time   NA 136 02/26/2020 0633   K 4.1 02/26/2020 0633   CL 102 02/26/2020 0633   CO2 25 02/26/2020 0633   GLUCOSE 147 (H) 02/26/2020 0633   BUN 14 02/26/2020 0633   CREATININE 1.14 (H) 02/26/2020 0633   CREATININE 0.71 04/30/2015 1259   CALCIUM 8.3 (L) 02/26/2020 0633   GFRNONAA 52 (L) 02/26/2020 0633   GFRAA >60 10/25/2016 2348    INR    Component Value Date/Time   INR 1.00 04/30/2015 1259     Intake/Output Summary (Last 24 hours) at 02/26/2020 0747 Last data filed at 02/26/2020 0325 Gross per 24 hour  Intake 2181.91 ml  Output --  Net 2181.91 ml     Assessment/Plan:  70 y.o. female is s/p R CFA endarterectomy with common and external iliac artery stenting 2 Days Post-Op. Rest pain resolved. Right lower extremity is well perfused and warm. Doppler AT/ PT signals in  right foot. Continue to place gauze in right groin to wick moisture. Afebrile. VSS. Mobilize as tolerated. PT/ OT recommending HH. Continue Asa, Plavix and statin. Stable from vascular standpoint   DVT prophylaxis:  Sq heparin   Graceann Congress, PA-C [facility] [phone] 02/26/2020 7:47 AM   I have seen and evaluated the patient. I agree with the PA note as documented above.  Postop day 2 status post right femoral endarterectomy with retrograde iliac stenting.  She has a palpable right femoral pulse.  She has monophasic DP and PT signals in the right foot.  She states her rest pain is resolved.  Will need to continue dual antiplatelet therapy aspirin Plavix from my standpoint.  Feel she needs one more day and hopefully home tomorrow.  Cephus Shelling, MD [facility] Office: [phone]

## 2020-02-26 NOTE — TOC Initial Note (Signed)
Transition of Care (TOC) - Initial/Assessment Note  Donn Pierini RN, BSN Transitions of Care Unit 4E- RN Case Manager See Treatment Team for direct phone #    Patient Details  Name: Katherine Williams MRN: [id number] Date of Birth: May 16, 1950  Transition of Care [facility]) CM/SW Contact:    Darrold Span, RN Phone Number: 02/26/2020, 2:41 PM  Clinical Narrative:                 Pt from home s/p R CFA endarterectomy with common and external iliac artery stenting, plan is for home with [facility]. Order has been placed for HHPT/OT. CM in to speak with pt at bedside. Per pt she has a RW at home, does not feel like she will need BSC, discussed w/c and at this time pt wants to wait and see how she does and f/u with her PCP if she feels like she needs w/c.  Choice offered for [facility] with list provided Per CMS guidelines from medicare.gov website with star ratings (copy placed in shadow chart)- per pt her first choice is [facility] and if not available then second choice is [facility]. Address, phone # and PCP all confirmed with pt in epic.   Call made to [facility] ([facility])- and they will review referral to see if they can accept-referral and clinicals faxed to them via epic to [phone]- await return call.  Expected Discharge Plan: Home w Home Health Services Barriers to Discharge: Continued Medical Work up   Patient Goals and CMS Choice Patient states their goals for this hospitalization and ongoing recovery are:: "return home and be able to clean my house" CMS Medicare.gov Compare Post Acute Care list provided to:: Patient Choice offered to / list presented to : Patient  Expected Discharge Plan and Services Expected Discharge Plan: Home w Home Health Services   Discharge Planning Services: CM Consult Post Acute Care Choice: Home Health,Durable Medical Equipment Living arrangements for the past 2 months: Single Family Home                 DME Arranged: N/A DME Agency: NA        HH Arranged: PT,OT HH Agency: [facility] Date [facility] Agency Contacted: 02/26/20 Time HH Agency Contacted: [phone]    Prior Living Arrangements/Services Living arrangements for the past 2 months: Single Family Home Lives with:: Self Patient language and need for interpreter reviewed:: Yes Do you feel safe going back to the place where you live?: Yes      Need for Family Participation in Patient Care: Yes (Comment) Care giver support system in place?: Yes (comment) Current home services: DME (RW) Criminal Activity/Legal Involvement Pertinent to Current Situation/Hospitalization: No - Comment as needed  Activities of Daily Living Home Assistive Devices/Equipment: Walker (specify type),Eyeglasses,Insulin Pump,CBG Meter ADL Screening (condition at time of admission) Patient's cognitive ability adequate to safely complete daily activities?: Yes Is the patient deaf or have difficulty hearing?: No Does the patient have difficulty seeing, even when wearing glasses/contacts?: No Does the patient have difficulty concentrating, remembering, or making decisions?: No Patient able to express need for assistance with ADLs?: Yes Does the patient have difficulty dressing or bathing?: No Independently performs ADLs?: Yes (appropriate for developmental age) Does the patient have difficulty walking or climbing stairs?: Yes Weakness of Legs: Both Weakness of Arms/Hands: None  Permission Sought/Granted Permission sought to share information with : Facility Industrial/product designer granted to share information with : Yes, Verbal Permission Granted  Permission granted to share info w AGENCY: HH        Emotional Assessment Appearance:: Appears stated age Attitude/Demeanor/Rapport: Engaged Affect (typically observed): Appropriate Orientation: : Oriented to Self,Oriented to Place,Oriented to  Time,Oriented to Situation Alcohol / Substance Use: Not Applicable Psych  Involvement: No (comment)  Admission diagnosis:  Hypokalemia [E87.6] Leg pain [M79.606] Peripheral vascular disease (HCC) [I73.9] DDD (degenerative disc disease), lumbar [M51.36] Decreased pedal pulses [R09.89] PVD (peripheral vascular disease) with claudication (HCC) [I73.9] Numbness and tingling of right leg [R20.0, R20.2] Patient Active Problem List   Diagnosis Date Noted  . Microcytic anemia 02/22/2020  . Morbid obesity (HCC) 02/20/2020  . PVD (peripheral vascular disease) with claudication (HCC) 02/19/2020  . Class 2 severe obesity due to excess calories with serious comorbidity and body mass index (BMI) of 38.0 to 38.9 in adult (HCC) 08/29/2016  . Chronic diastolic heart failure (HCC) 06/08/2015  . Claudication (HCC) 05/07/2015  . Pain in the chest   . Chest pain   . DM type 2 causing vascular disease (HCC)   . Hypertensive urgency 02/24/2015  . Hypokalemia 02/24/2015  . Elevated troponin 02/24/2015  . Asthma exacerbation 02/23/2015  . COPD exacerbation (HCC) 02/23/2015  . LEG PAIN, BILATERAL 05/25/2006  . CHEST PAIN, ATYPICAL, HX OF 02/23/2006  . Mixed hyperlipidemia 12/21/2005  . Current smoker 12/21/2005  . SYNDROME, RESTLESS LEGS 12/21/2005  . SYNDROME, CARPAL TUNNEL 12/21/2005  . Essential hypertension 12/21/2005  . PERIPHERAL VASCULAR DISEASE 12/21/2005  . GERD 12/21/2005  . OSTEOARTHRITIS 12/21/2005  . HIP PAIN, CHRONIC 12/21/2005   PCP:  Benita Stabile, MD Pharmacy:   [facility] - [place], [place] - [address] [place] [address] Phone: [phone] Fax: [phone]  [facility] - [place], [place] - [address] [place] [address] Phone: [phone] Fax: [phone]     Social Determinants of Health (SDOH) Interventions    Readmission Risk Interventions No flowsheet data found.

## 2020-02-26 NOTE — Progress Notes (Signed)
Mobility Specialist: Progress Note   02/26/20 1408  Mobility  Activity Ambulated in hall  Level of Assistance Modified independent, requires aide device or extra time  Assistive Device Four wheel walker  Distance Ambulated (ft) 230 ft  Mobility Response Tolerated well  Mobility performed by Mobility specialist  Bed Position Chair  $Mobility charge 1 Mobility   Post-Mobility: 72 HR, 133/81 BP, 92% SpO2  Pt c/o soreness in her R groin during ambulation and feeling slightly SOB after returning to recliner, recovered quickly. Pt hopeful for discharge tomorrow.   [facility] Arilla Hice Mobility Specialist

## 2020-02-26 NOTE — Discharge Summary (Signed)
Physician Discharge Summary  Katherine Williams ZOX:[id number] DOB: 1950-06-24 DOA: 02/19/2020  PCP: Benita Stabile, MD  Admit date: 02/19/2020 Discharge date: 02/27/2020  Admitted From: Home Disposition: Home health  Recommendations for Outpatient Follow-up:  1. Follow up with PCP and w/ vascular surgeon in 1-2 weeks  Home Health: Yes, PT, OT Equipment/Devices: Wheelchair (18x16 lightweight wheelchair with desk armrests, anti tippers and elevating legrests);Wheelchair cushion (18x16 pressure relieving cushion)   Discharge Condition: Stable Code Status:   Code Status: Full Code Diet recommendation:  Diet Order            Diet - low sodium heart healthy           Diet heart healthy/carb modified Room service appropriate? Yes; Fluid consistency: Thin  Diet effective now                  Brief/Interim Summary: 70 year old female with complex comorbidities with PVD, hypertension, T2DM on insulin pump, ongoing tobacco abuse status post bilateral superficial femoral-popliteal bypass and stent placement was readmitted 02/19/2020 due to numbness and tingling in her lower extremity.  Work-up in the ED showed no flow at the right ankle and was found to have occluded right external iliac stent and common femoral artery, seen by vascular surgery and underwent  Redo right common femoral endarterectomy with retrograde stenting of the right common and external iliac artery that required snaring access from left groin 02/24/20. Postoperative course uncomplicated. HAVING Good flow, worked with PT OT. PT has suggested home health PT OT and wheelchair, patient reports she lives alone but she is comfortable going home home.  She was seen by vascular this morning and cleared for discharge home with Plavix aspirin and statin sent pain medication she will follow-up with vascular surgery  In 3 weeks for outpatient follow-up  Discharge Diagnoses:  PVD Redo right common femoral endarterectomy with retrograde stenting of  the right common and external iliac artery that required snaring access from left groin 02/24/20.   Postop doing well hemoglobin is stable, she will continue with aspirin Plavix and Crestor,= okay for d/c home today per vascualr and f/u with Dr Chestine Spore in 3 weeks.We will set up home health PT OT and wheelchair.  Diabetes mellitus on insulin pump, DM coordinator following, fairly stable hemoglobin A1c 8.0-resume insulin pump upon discharge.  Managed with basal bolus while in-house. Recent Labs  Lab 02/26/20 1633 02/26/20 1953 02/26/20 2022 02/26/20 2320 02/27/20 0315  GLUCAP 130* 215* 182* 130* 127*   Hypertension/Mixed hyperlipidemia:  BP trending up resumed HCTZ,continue also amlodipine, Coreg. BP still high so resume losartan too. follow-up with PCP to monitor bp. Cont statins.  Current smoker: Smoking cessation counseling, nicotine patch, bupropion  GERD: Continue PPI  Hypokalemia: Resolved  Chronic diastolic heart failure: Compensated currently euvolemic.  Continue HCTZ  Morbid obesity with BMI 38, will benefit with weight loss PCP follow-up  Consults:  Vascular surgery  Subjective: Alert awake oriented, feels strong enough for discharge home today  Discharge Exam: Vitals:   02/27/20 0316 02/27/20 0711  BP: (!) 165/58 (!) 141/57  Pulse: 75 65  Resp: 18 13  Temp: 99.6 F (37.6 C) 99.7 F (37.6 C)  SpO2: 97% 97%   General: Pt is alert, awake, not in acute distress Cardiovascular: RRR, S1/S2 +, no rubs, no gallops Respiratory: CTA bilaterally, no wheezing, no rhonchi Abdominal: Soft, NT, ND, bowel sounds + Extremities: no edema, no cyanosis  Discharge Instructions  Discharge Instructions    Diet - low  sodium heart healthy   Complete by: As directed    Discharge instructions   Complete by: As directed    F/u with Dr Chestine Spore in 3 WK  Please call call MD or return to ER for similar or worsening recurring problem that brought you to hospital or if any  fever,nausea/vomiting,abdominal pain, uncontrolled pain, chest pain,  shortness of breath or any other alarming symptoms.  Please follow-up your doctor as instructed in a week time and call the office for appointment.  Please avoid alcohol, smoking, or any other illicit substance and maintain healthy habits including taking your regular medications as prescribed.  You were cared for by a hospitalist during your hospital stay. If you have any questions about your discharge medications or the care you received while you were in the hospital after you are discharged, you can call the unit and ask to speak with the hospitalist on call if the hospitalist that took care of you is not available.  Once you are discharged, your primary care physician will handle any further medical issues. Please note that NO REFILLS for any discharge medications will be authorized once you are discharged, as it is imperative that you return to your primary care physician (or establish a relationship with a primary care physician if you do not have one) for your aftercare needs so that they can reassess your need for medications and monitor your lab values   Discharge wound care:   Complete by: As directed    Dry gauze in groin   Increase activity slowly   Complete by: As directed      Allergies as of 02/27/2020      Reactions   Lisinopril-hydrochlorothiazide Hives, Itching   Penicillins    Sulfonamide Derivatives Other (See Comments)   Hives      Medication List    TAKE these medications   amLODipine 10 MG tablet Commonly known as: NORVASC Take 1 tablet (10 mg total) by mouth daily.   aspirin 81 MG EC tablet Take 1 tablet (81 mg total) by mouth daily.   carvedilol 25 MG tablet Commonly known as: COREG Take 1 tablet (25 mg total) 2 (two) times daily by mouth. What changed:   additional instructions  Another medication with the same name was removed. Continue taking this medication, and follow the  directions you see here.   clopidogrel 75 MG tablet Commonly known as: PLAVIX Take 1 tablet (75 mg total) by mouth daily at 6 (six) AM. Start taking on: February 28, 2020   hydrALAZINE 25 MG tablet Commonly known as: APRESOLINE Take 1 tablet (25 mg total) by mouth every 8 (eight) hours.   hydrochlorothiazide 25 MG tablet Commonly known as: HYDRODIURIL Take 25 mg by mouth daily.   insulin lispro 100 UNIT/ML injection Commonly known as: HUMALOG Inject 111 Units into the skin. Via pump 111 units daily   ipratropium-albuterol 0.5-2.5 (3) MG/3ML Soln Commonly known as: DUONEB Inhale 3 mLs into the lungs every 6 (six) hours as needed (for shortness of breath).   losartan 100 MG tablet Commonly known as: COZAAR Take 1 tablet (100 mg total) by mouth daily.   nicotine polacrilex 2 MG gum Commonly known as: NICORETTE Take 1 each (2 mg total) by mouth as needed for smoking cessation.   oxyCODONE-acetaminophen 5-325 MG tablet Commonly known as: PERCOCET/ROXICET Take 1 tablet by mouth every 6 (six) hours as needed for moderate pain.   pantoprazole 40 MG tablet Commonly known as: PROTONIX TAKE 1 TABLET  BY MOUTH TWICE DAILY BEFORE A MEAL. What changed: See the new instructions.   rosuvastatin 40 MG tablet Commonly known as: CRESTOR Take 1 tablet (40 mg total) by mouth at bedtime. What changed:   medication strength  how much to take            Discharge Care Instructions  (From admission, onward)         Start     Ordered   02/27/20 0000  Discharge wound care:       Comments: Dry gauze in groin   02/27/20 1001          Follow-up Information    Cephus Shelling, MD Follow up in 3 week(s).   Specialty: Vascular Surgery Contact information: [address] [place] [address] [phone]        [facility] Follow up.   Why: HHRN/PT/OT arranged - they will contact you to set up start of care visit Contact  information: [phone]             Allergies  Allergen Reactions  . Lisinopril-Hydrochlorothiazide Hives and Itching  . Penicillins   . Sulfonamide Derivatives Other (See Comments)    Hives    The results of significant diagnostics from this hospitalization (including imaging, microbiology, ancillary and laboratory) are listed below for reference.    Microbiology: Recent Results (from the past 240 hour(s))  Resp Panel by RT-PCR (Flu A&B, Covid) Nasopharyngeal Swab     Status: None   Collection Time: 02/19/20  9:37 PM   Specimen: Nasopharyngeal Swab; Nasopharyngeal(NP) swabs in vial transport medium  Result Value Ref Range Status   SARS Coronavirus 2 by RT PCR NEGATIVE NEGATIVE Final    Comment: (NOTE) SARS-CoV-2 target nucleic acids are NOT DETECTED.  The SARS-CoV-2 RNA is generally detectable in upper respiratory specimens during the acute phase of infection. The lowest concentration of SARS-CoV-2 viral copies this assay can detect is 138 copies/mL. A negative result does not preclude SARS-Cov-2 infection and should not be used as the sole basis for treatment or other patient management decisions. A negative result may occur with  improper specimen collection/handling, submission of specimen other than nasopharyngeal swab, presence of viral mutation(s) within the areas targeted by this assay, and inadequate number of viral copies(<138 copies/mL). A negative result must be combined with clinical observations, patient history, and epidemiological information. The expected result is Negative.  Fact Sheet for Patients:  BloggerCourse.com  Fact Sheet for Healthcare Providers:  SeriousBroker.it  This test is no t yet approved or cleared by the [place] FDA and  has been authorized for detection and/or diagnosis of SARS-CoV-2 by FDA under an Emergency Use Authorization (EUA). This EUA will remain  in effect (meaning  this test can be used) for the duration of the COVID-19 declaration under Section 564(b)(1) of the Act, 21 U.S.C.section 360bbb-3(b)(1), unless the authorization is terminated  or revoked sooner.       Influenza A by PCR NEGATIVE NEGATIVE Final   Influenza B by PCR NEGATIVE NEGATIVE Final    Comment: (NOTE) The Xpert Xpress SARS-CoV-2/FLU/RSV plus assay is intended as an aid in the diagnosis of influenza from Nasopharyngeal swab specimens and should not be used as a sole basis for treatment. Nasal washings and aspirates are unacceptable for Xpert Xpress SARS-CoV-2/FLU/RSV testing.  Fact Sheet for Patients: BloggerCourse.com  Fact Sheet for Healthcare Providers: SeriousBroker.it  This test is not yet approved or cleared by the [place] FDA and has  been authorized for detection and/or diagnosis of SARS-CoV-2 by FDA under an Emergency Use Authorization (EUA). This EUA will remain in effect (meaning this test can be used) for the duration of the COVID-19 declaration under Section 564(b)(1) of the Act, 21 U.S.C. section 360bbb-3(b)(1), unless the authorization is terminated or revoked.  Performed at [facility], [address] [place], [place] [address]   Surgical PCR screen     Status: None   Collection Time: 02/24/20  2:45 AM   Specimen: Nasal Mucosa; Nasal Swab  Result Value Ref Range Status   MRSA, PCR NEGATIVE NEGATIVE Final   Staphylococcus aureus NEGATIVE NEGATIVE Final    Comment: (NOTE) The Xpert SA Assay (FDA approved for NASAL specimens in patients 70 years of age and older), is one component of a comprehensive surveillance program. It is not intended to diagnose infection nor to guide or monitor treatment. Performed at [facility], [address] [place], [place] [address]     Procedures/Studies: DG Lumbar Spine Complete  Result Date: 02/19/2020 CLINICAL DATA:  Low back pain with lower extremity  numbness EXAM: LUMBAR SPINE - COMPLETE 4+ VIEW COMPARISON:  None. FINDINGS: Frontal, lateral, spot lumbosacral lateral, and bilateral oblique views were obtained. There are 5 non-rib-bearing lumbar type vertebral bodies. There is no fracture or spondylolisthesis. There is moderately severe disc space narrowing at L5-S1 with milder disc space narrowing at L3-4 and L4-5. There is facet osteoarthritic change at L3-4, L4-5, and L5-S1 bilaterally. There is aortic atherosclerosis. There is iliac artery atherosclerosis with a stent in the right external iliac artery. IMPRESSION: No fracture or spondylolisthesis. 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 Disc space narrowing at L5-S1 and to a lesser degree at L3-4 and L4-5. Aortic Atherosclerosis (ICD10-I70.0). Electronically Signed   By: Bretta BangWilliam  Woodruff III M.D.   On: 02/19/2020 17:16   CT ANGIO AO+BIFEM W & OR WO CONTRAST  Result Date: 02/21/2020 CLINICAL DATA:  70 year old female with right foot pain and decreased pulses EXAM: CT ANGIOGRAPHY OF ABDOMINAL AORTA WITH ILIOFEMORAL RUNOFF TECHNIQUE: Multidetector CT imaging of the abdomen, pelvis and lower extremities was performed using the standard protocol during bolus administration of intravenous contrast. Multiplanar CT image reconstructions and MIPs were obtained to evaluate the vascular anatomy. CONTRAST:  100mL OMNIPAQUE IOHEXOL 350 MG/ML SOLN COMPARISON:  Ankle-brachial indices obtained earlier today FINDINGS: VASCULAR Aorta: Small caliber aorta with atherosclerotic calcifications throughout. No evidence of aneurysm or dissection. 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 Sys likely chronic in nature given the relative atrophy of the celiac axis and proximal hepatic and splenic arteries. Hypertrophy of the pancreaticoduodenal arcade is present. SMA: Patent without evidence of aneurysm, dissection, vasculitis or significant stenosis. Renals: Single renal arteries  bilaterally. Heavily calcified atherosclerotic plaque results in moderate to advanced stenosis of the proximal left renal artery. The right renal artery is also diseased with perhaps mild to moderate stenosis. IMA: Occluded at the origin. RIGHT Lower Extremity Inflow: Scattered calcified atherosclerotic plaque throughout the common iliac artery with at least moderate focal stenosis. The internal iliac artery is heavily diseased and likely stenotic at the origin. The most proximal portion of the external iliac artery is patent. However, the previously placed external iliac artery stent is completely occluded. 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 The profunda femoral branches reconstitute via collateral flow. The previously placed femoral to above the knee popliteal bypass graft is completely occluded and collapsed. The native popliteal artery reconstitutes via collateral  flow. Runoff: Diseased but patent 3 vessel runoff to the ankle. LEFT Lower Extremity Inflow: Heavily calcified atherosclerotic plaque throughout the common iliac artery with at least moderate focal stenosis. The internal iliac artery remains patent. The external iliac artery is diseased but patent and without significant stenosis. Outflow: Surgical changes of prior left common femoral endarterectomy and femoral to above the knee bypass graft. The bypass graft is completely thrombosed. The profunda femoral artery remains patent. Popliteal artery reconstitutes via collateral flow. Runoff: Diseased below the knee arteries. The anterior tibial artery occludes just above the ankle. There appears to be peroneal continuation of the dorsalis pedis artery. The posterior tibial artery remains patent to the ankle. Veins: No obvious venous abnormality within the limitations of this arterial phase study. Review of the MIP images confirms the above findings. NON-VASCULAR Lower chest: No acute abnormality. Hepatobiliary: No focal  liver abnormality is seen. No gallstones, gallbladder wall thickening, or biliary dilatation. Pancreas: Unremarkable. No pancreatic ductal dilatation or surrounding inflammatory changes. Spleen: Normal in size without focal abnormality. Adrenals/Urinary Tract: Adrenal glands are unremarkable. No hydronephrosis or nephrolithiasis. Slight renal size discrepancy with the left kidney smaller than the right consistent with chronic ischemia. No enhancing renal mass. Tiny low-attenuation renal lesions are too small to characterize but statistically likely benign cysts. Stomach/Bowel: Stomach is within normal limits. Appendix appears normal. No evidence of bowel wall thickening, distention, or inflammatory changes. Lymphatic: No suspicious lymphadenopathy. Reproductive: Status post hysterectomy. No adnexal masses. Other: No abdominal wall hernia or abnormality. No abdominopelvic ascites. Musculoskeletal: No acute fracture or aggressive appearing lytic or blastic osseous lesion. Lower lumbar degenerative disc disease. Minimal grade 1 anterolisthesis of L4 on L5. IMPRESSION: VASCULAR 1. Severe right lower extremity peripheral arterial disease with at least moderate stenosis of the common iliac artery and total occlusion of the external iliac and common femoral arteries as well as the femoral to above the knee popliteal bypass graft. The popliteal and runoff arteries reconstitute via collateral flow. 2. Severe left lower extremity peripheral arterial disease with at least moderate stenosis of the common iliac artery and chronic occlusion of the femoral to above the knee popliteal bypass graft. 3. Probable peroneal continuation of the distal anterior tibial artery on the left. 4. 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 5. High-grade stenosis of the proximal left renal artery. 6. Moderate stenosis of the mid right renal artery. 7. Aortic  Atherosclerosis (ICD10-I70.0). NON-VASCULAR 1. No acute abnormality within the abdomen or pelvis. 2. Mild atrophy and delayed nephrogram of the left kidney consistent with chronic ischemia. 3. Lower lumbar degenerative disc disease. Signed, Sterling Big, MD, RPVI [facility] Electronically Signed   By: Malachy Moan M.D.   On: 02/21/2020 16:31   US ARTERIAL ABI (SCREENING LOWER EXTREMITY)  Result Date: 02/19/2020 CLINICAL DATA:  Right leg numbness. EXAM: NONINVASIVE PHYSIOLOGIC VASCULAR STUDY OF BILATERAL LOWER EXTREMITIES TECHNIQUE: Evaluation of both lower extremities were performed at rest, including calculation of ankle-brachial indices with single level Doppler, pressure and pulse volume recording. COMPARISON:  12/06/2016 FINDINGS: Right ABI: Could not be obtained due to lack of flow in the right ankle. Left ABI: 0.50 (previously 0.50) Right Lower Extremity: No arterial signal identified in the right dorsalis pedis artery or the right posterior tibial artery. Left Lower Extremity:  Monophasic waveforms in the left ankle. IMPRESSION: 1. No arterial flow identified in the right ankle. 2. Left ABI is  stable, measuring 0.50. Findings are compatible with severe peripheral vascular disease in left lower extremity. These results were called by telephone at the time of interpretation on 02/19/2020 at 5:12 pm to provider [facility] , who verbally acknowledged these results. Electronically Signed   By: Richarda OverlieAdam  Henn M.D.   On: 02/19/2020 17:14   HYBRID OR IMAGING ([facility] ONLY)  Result Date: 02/24/2020 There is no interpretation for this exam.  This order is for images obtained during a surgical procedure.  Please See "Surgeries" Tab for more information regarding the procedure.    Labs: BNP (last 3 results) No results for input(s): BNP in the last 8760 hours. Basic Metabolic Panel: Recent Labs  Lab 02/22/20 0216 02/23/20 0228 02/24/20 0200  02/24/20 1402 02/25/20 0504 02/26/20 0633  NA 138 137 138  --  136 136  K 3.6 3.5 3.9  --  4.2 4.1  CL 102 101 101  --  101 102  CO2 26 26 27   --  25 25  GLUCOSE 136* 157* 110*  --  143* 147*  BUN 9 10 8   --  13 14  CREATININE 0.81 0.94 0.88 1.24* 1.11* 1.14*  CALCIUM 8.9 8.9 9.2  --  8.6* 8.3*  MG 1.9  --   --   --   --   --   PHOS 2.9  --   --   --   --   --    Liver Function Tests: Recent Labs  Lab 02/23/20 0228  AST 25  ALT 21  ALKPHOS 43  BILITOT 0.6  PROT 6.4*  ALBUMIN 2.9*   No results for input(s): LIPASE, AMYLASE in the last 168 hours. No results for input(s): AMMONIA in the last 168 hours. CBC: Recent Labs  Lab 02/23/20 0228 02/24/20 0200 02/24/20 1402 02/25/20 0504 02/26/20 0633  WBC 8.8 9.4 11.3* 14.2* 13.9*  HGB 11.0* 11.5* 11.5* 12.4 9.8*  HCT 36.1 37.0 37.7 38.2 32.1*  MCV 79.2* 78.7* 79.2* 77.5* 80.5  PLT 333 330 314 318 275   Cardiac Enzymes: No results for input(s): CKTOTAL, CKMB, CKMBINDEX, TROPONINI in the last 168 hours. BNP: Invalid input(s): POCBNP CBG: Recent Labs  Lab 02/26/20 1633 02/26/20 1953 02/26/20 2022 02/26/20 2320 02/27/20 0315  GLUCAP 130* 215* 182* 130* 127*   D-Dimer No results for input(s): DDIMER in the last 72 hours. Hgb A1c No results for input(s): HGBA1C in the last 72 hours. Lipid Profile Recent Labs    02/25/20 0504  CHOL 149  HDL 54  LDLCALC 75  TRIG 99  CHOLHDL 2.8   Thyroid function studies No results for input(s): TSH, T4TOTAL, T3FREE, THYROIDAB in the last 72 hours.  Invalid input(s): FREET3 Anemia work up No results for input(s): VITAMINB12, FOLATE, FERRITIN, TIBC, IRON, RETICCTPCT in the last 72 hours. Urinalysis No results found for: COLORURINE, APPEARANCEUR, LABSPEC, PHURINE, GLUCOSEU, HGBUR, BILIRUBINUR, KETONESUR, PROTEINUR, UROBILINOGEN, NITRITE, LEUKOCYTESUR Sepsis Labs Invalid input(s): PROCALCITONIN,  WBC,  LACTICIDVEN Microbiology Recent Results (from the past 240 hour(s))   Resp Panel by RT-PCR (Flu A&B, Covid) Nasopharyngeal Swab     Status: None   Collection Time: 02/19/20  9:37 PM   Specimen: Nasopharyngeal Swab; Nasopharyngeal(NP) swabs in vial transport medium  Result Value Ref Range Status   SARS Coronavirus 2 by RT PCR NEGATIVE NEGATIVE Final    Comment: (NOTE) SARS-CoV-2 target nucleic acids are NOT DETECTED.  The SARS-CoV-2 RNA is generally detectable in upper respiratory specimens during the acute phase of infection. The lowest concentration  of SARS-CoV-2 viral copies this assay can detect is 138 copies/mL. A negative result does not preclude SARS-Cov-2 infection and should not be used as the sole basis for treatment or other patient management decisions. A negative result may occur with  improper specimen collection/handling, submission of specimen other than nasopharyngeal swab, presence of viral mutation(s) within the areas targeted by this assay, and inadequate number of viral copies(<138 copies/mL). A negative result must be combined with clinical observations, patient history, and epidemiological information. The expected result is Negative.  Fact Sheet for Patients:  BloggerCourse.com  Fact Sheet for Healthcare Providers:  SeriousBroker.it  This test is no t yet approved or cleared by the [place] FDA and  has been authorized for detection and/or diagnosis of SARS-CoV-2 by FDA under an Emergency Use Authorization (EUA). This EUA will remain  in effect (meaning this test can be used) for the duration of the COVID-19 declaration under Section 564(b)(1) of the Act, 21 U.S.C.section 360bbb-3(b)(1), unless the authorization is terminated  or revoked sooner.       Influenza A by PCR NEGATIVE NEGATIVE Final   Influenza B by PCR NEGATIVE NEGATIVE Final    Comment: (NOTE) The Xpert Xpress SARS-CoV-2/FLU/RSV plus assay is intended as an aid in the diagnosis of influenza from  Nasopharyngeal swab specimens and should not be used as a sole basis for treatment. Nasal washings and aspirates are unacceptable for Xpert Xpress SARS-CoV-2/FLU/RSV testing.  Fact Sheet for Patients: BloggerCourse.com  Fact Sheet for Healthcare Providers: SeriousBroker.it  This test is not yet approved or cleared by the [place] FDA and has been authorized for detection and/or diagnosis of SARS-CoV-2 by FDA under an Emergency Use Authorization (EUA). This EUA will remain in effect (meaning this test can be used) for the duration of the COVID-19 declaration under Section 564(b)(1) of the Act, 21 U.S.C. section 360bbb-3(b)(1), unless the authorization is terminated or revoked.  Performed at [facility], [address], [place], [place] [address]   Surgical PCR screen     Status: None   Collection Time: 02/24/20  2:45 AM   Specimen: Nasal Mucosa; Nasal Swab  Result Value Ref Range Status   MRSA, PCR NEGATIVE NEGATIVE Final   Staphylococcus aureus NEGATIVE NEGATIVE Final    Comment: (NOTE) The Xpert SA Assay (FDA approved for NASAL specimens in patients 58 years of age and older), is one component of a comprehensive surveillance program. It is not intended to diagnose infection nor to guide or monitor treatment. Performed at [facility], [address] [place], [place] [address]      Time coordinating discharge: 25 minutes  SIGNED: Lanae Boast, MD  [facility] 02/27/2020, 1:31 PM  If 7PM-7AM, please contact night-coverage [email or web address]

## 2020-02-26 NOTE — Progress Notes (Signed)
PROGRESS NOTE    Katherine Williams  OTL:[id number] DOB: [birth year] DOA: 02/19/2020 PCP: Benita Stabile, MD   Chief Complaint  Patient presents with  . Numbness  . Foot Pain  Brief Narrative: 70 year old female with complex comorbidities with PVD, hypertension, T2DM on insulin pump, ongoing tobacco abuse status post bilateral superficial femoral-popliteal bypass and stent placement was readmitted 02/19/2020 due to numbness and tingling in her lower extremity.  Work-up in the ED showed no flow at the right ankle and was found to have occluded right external iliac stent and common femoral artery, seen by vascular surgery and underwent  Redo right common femoral endarterectomy with retrograde stenting of the right common and external iliac artery that required snaring access from left groin 02/24/20  Subjective: Alert awake oriented, resting comfortably. Assessment & Plan:  PVD Redo right common femoral endarterectomy with retrograde stenting of the right common and external iliac artery that required snaring access from left groin 02/24/20.  Doing well postop, continue to follow-up with vascular, continue on DAPT and,continue to mobilize more today.  Continue with dry gauze in the right groin incision  Diabetes mellitus on insulin pump, DM coordinator following, fairly stable hemoglobin A1c 8.0, blood sugars well controlled, continue with Lantus 12 units and sliding scale insulin while here and resume insulin pump on discharge Recent Labs  Lab 02/25/20 1628 02/25/20 2010 02/25/20 2318 02/26/20 0326 02/26/20 0829  GLUCAP 183* 143* 149* 134* 133*   Lab Results  Component Value Date   HGBA1C 8.0 (H) 02/19/2020   Hypertension/Mixed hyperlipidemia: BP is uptrending, resume HCTZ, continue amlodipine and Coreg.  Continue to hold losartan.  Continue Crestor.    Current smoker: Counseled smoking cessation.  Continue nicotine patch, bupropion  GERD: on PPI  Hypokalemia: Resolved  Chronic diastolic  heart failure: Compensated euvolemic   Morbid obesity with BMI 38, will benefit with weight loss PCP follow-up  Nutrition: Diet Order            Diet heart healthy/carb modified Room service appropriate? Yes; Fluid consistency: Thin  Diet effective now                 Body mass index is 38.62 kg/m.  DVT prophylaxis: heparin injection 5,000 Units Start: 02/25/20 0600 SCD's Start: 02/24/20 1458 Code Status:   Code Status: Full Code  Family Communication: plan of care discussed with patient at bedside.  Status is: Inpatient  Remains inpatient appropriate because:Inpatient level of care appropriate due to severity of illness   Dispo: The patient is from: Home lives alone              Anticipated d/c is to: Home with home health PT OT              Anticipated d/c date is: 1 day once okay w/ vascular surgery              Patient currently is not medically stable to d/c. Consultants:see note  Procedures:see note  Culture/Microbiology No results found for: SDES, SPECREQUEST, CULT, REPTSTATUS  Other culture-see note  Medications: Scheduled Meds: . amLODipine  10 mg Oral Daily  . aspirin EC  81 mg Oral Daily  . buPROPion  150 mg Oral BID  . carvedilol  25 mg Oral BID  . Chlorhexidine Gluconate Cloth  6 each Topical Daily  . clopidogrel  75 mg Oral Q0600  . docusate sodium  100 mg Oral Daily  . heparin  5,000 Units Subcutaneous Q8H  . hydrALAZINE  25 mg Oral Q8H  . hydrochlorothiazide  25 mg Oral Daily  . insulin aspart  0-15 Units Subcutaneous Q4H  . insulin detemir  12 Units Subcutaneous BID  . mupirocin ointment  1 application Nasal BID  . nicotine  21 mg Transdermal Daily  . pantoprazole  40 mg Oral BID AC  . rosuvastatin  40 mg Oral QHS   Continuous Infusions: . sodium chloride    . sodium chloride Stopped (02/26/20 0032)  . magnesium sulfate bolus IVPB      Antimicrobials: Anti-infectives (From admission, onward)   Start     Dose/Rate Route Frequency Ordered  Stop   02/25/20 0000  vancomycin (VANCOCIN) IVPB 1000 mg/200 mL premix        1,000 mg 200 mL/hr over 60 Minutes Intravenous Every 24 hours 02/24/20 1829 02/25/20 0141   02/24/20 2000  vancomycin (VANCOCIN) IVPB 1000 mg/200 mL premix  Status:  Discontinued        1,000 mg 200 mL/hr over 60 Minutes Intravenous Every 12 hours 02/24/20 1458 02/24/20 1829   02/24/20 0000  vancomycin (VANCOCIN) IVPB 1000 mg/200 mL premix        1,000 mg 200 mL/hr over 60 Minutes Intravenous To Surgery 02/23/20 0916 02/24/20 0801     Objective: Vitals: Today's Vitals   02/25/20 2340 02/26/20 0327 02/26/20 0802 02/26/20 0900  BP:  131/67 (!) 166/43   Pulse:  72 73   Resp:  16 17   Temp:  98.6 F (37 C) 99.3 F (37.4 C)   TempSrc:  Oral Oral   SpO2:  100% 100%   Weight:      Height:      PainSc: Asleep   2     Intake/Output Summary (Last 24 hours) at 02/26/2020 1148 Last data filed at 02/26/2020 0325 Gross per 24 hour  Intake 2181.91 ml  Output --  Net 2181.91 ml   Filed Weights   02/19/20 1529  Weight: 102.1 kg   Weight change:   Intake/Output from previous day: 01/11 0701 - 01/12 0700 In: 2181.9 [P.O.:200; I.V.:1981.9] Out: -  Intake/Output this shift: No intake/output data recorded.  Examination: General exam: AAOx3 , NAD, weak appearing. HEENT:Oral mucosa moist, Ear/Nose WNL grossly, dentition normal. Respiratory system: bilaterally clear,no wheezing or crackles,no use of accessory muscle Cardiovascular system: S1 & S2 +, No JVD,. Gastrointestinal system: Abdomen soft, NT,ND, BS+ Nervous System:Alert, awake, moving extremities and grossly nonfocal Extremities: No edema, distal peripheral pulses palpable-perfused right lower extremity dry gauze on right groin incision.  Skin: No rashes,no icterus. MSK: Normal muscle bulk,tone, power    Data Reviewed: I have personally reviewed following labs and imaging studies CBC: Recent Labs  Lab 02/23/20 0228 02/24/20 0200 02/24/20 1402  02/25/20 0504 02/26/20 0633  WBC 8.8 9.4 11.3* 14.2* 13.9*  HGB 11.0* 11.5* 11.5* 12.4 9.8*  HCT 36.1 37.0 37.7 38.2 32.1*  MCV 79.2* 78.7* 79.2* 77.5* 80.5  PLT 333 330 314 318 275   Basic Metabolic Panel: Recent Labs  Lab 02/19/20 1944 02/20/20 0147 02/22/20 0216 02/23/20 0228 02/24/20 0200 02/24/20 1402 02/25/20 0504 02/26/20 0633  NA 136   < > 138 137 138  --  136 136  K 2.6*   < > 3.6 3.5 3.9  --  4.2 4.1  CL 94*   < > 102 101 101  --  101 102  CO2 31   < > 26 26 27   --  25 25  GLUCOSE 164*   < >  136* 157* 110*  --  143* 147*  BUN 13   < > 9 10 8   --  13 14  CREATININE 0.82   < > 0.81 0.94 0.88 1.24* 1.11* 1.14*  CALCIUM 9.1   < > 8.9 8.9 9.2  --  8.6* 8.3*  MG 1.8  --  1.9  --   --   --   --   --   PHOS  --   --  2.9  --   --   --   --   --    < > = values in this interval not displayed.   GFR: Estimated Creatinine Clearance: 54.2 mL/min (A) (by C-G formula based on SCr of 1.14 mg/dL (H)). Liver Function Tests: Recent Labs  Lab 02/23/20 0228  AST 25  ALT 21  ALKPHOS 43  BILITOT 0.6  PROT 6.4*  ALBUMIN 2.9*   No results for input(s): LIPASE, AMYLASE in the last 168 hours. No results for input(s): AMMONIA in the last 168 hours. Coagulation Profile: No results for input(s): INR, PROTIME in the last 168 hours. Cardiac Enzymes: No results for input(s): CKTOTAL, CKMB, CKMBINDEX, TROPONINI in the last 168 hours. BNP (last 3 results) No results for input(s): PROBNP in the last 8760 hours. HbA1C: No results for input(s): HGBA1C in the last 72 hours. CBG: Recent Labs  Lab 02/25/20 1628 02/25/20 2010 02/25/20 2318 02/26/20 0326 02/26/20 0829  GLUCAP 183* 143* 149* 134* 133*   Lipid Profile: Recent Labs    02/25/20 0504  CHOL 149  HDL 54  LDLCALC 75  TRIG 99  CHOLHDL 2.8   Thyroid Function Tests: No results for input(s): TSH, T4TOTAL, FREET4, T3FREE, THYROIDAB in the last 72 hours. Anemia Panel: No results for input(s): VITAMINB12, FOLATE,  FERRITIN, TIBC, IRON, RETICCTPCT in the last 72 hours. Sepsis Labs: No results for input(s): PROCALCITON, LATICACIDVEN in the last 168 hours.  Recent Results (from the past 240 hour(s))  Resp Panel by RT-PCR (Flu A&B, Covid) Nasopharyngeal Swab     Status: None   Collection Time: 02/19/20  9:37 PM   Specimen: Nasopharyngeal Swab; Nasopharyngeal(NP) swabs in vial transport medium  Result Value Ref Range Status   SARS Coronavirus 2 by RT PCR NEGATIVE NEGATIVE Final    Comment: (NOTE) SARS-CoV-2 target nucleic acids are NOT DETECTED.  The SARS-CoV-2 RNA is generally detectable in upper respiratory specimens during the acute phase of infection. The lowest concentration of SARS-CoV-2 viral copies this assay can detect is 138 copies/mL. A negative result does not preclude SARS-Cov-2 infection and should not be used as the sole basis for treatment or other patient management decisions. A negative result may occur with  improper specimen collection/handling, submission of specimen other than nasopharyngeal swab, presence of viral mutation(s) within the areas targeted by this assay, and inadequate number of viral copies(<138 copies/mL). A negative result must be combined with clinical observations, patient history, and epidemiological information. The expected result is Negative.  Fact Sheet for Patients:  BloggerCourse.comhttps://www.fda.gov/media/152166/download  Fact Sheet for Healthcare Providers:  SeriousBroker.ithttps://www.fda.gov/media/152162/download  This test is no t yet approved or cleared by the [place] FDA and  has been authorized for detection and/or diagnosis of SARS-CoV-2 by FDA under an Emergency Use Authorization (EUA). This EUA will remain  in effect (meaning this test can be used) for the duration of the COVID-19 declaration under Section 564(b)(1) of the Act, 21 U.S.C.section 360bbb-3(b)(1), unless the authorization is terminated  or revoked sooner.  Influenza A by PCR NEGATIVE  NEGATIVE Final   Influenza B by PCR NEGATIVE NEGATIVE Final    Comment: (NOTE) The Xpert Xpress SARS-CoV-2/FLU/RSV plus assay is intended as an aid in the diagnosis of influenza from Nasopharyngeal swab specimens and should not be used as a sole basis for treatment. Nasal washings and aspirates are unacceptable for Xpert Xpress SARS-CoV-2/FLU/RSV testing.  Fact Sheet for Patients: BloggerCourse.com  Fact Sheet for Healthcare Providers: SeriousBroker.it  This test is not yet approved or cleared by the [place] FDA and has been authorized for detection and/or diagnosis of SARS-CoV-2 by FDA under an Emergency Use Authorization (EUA). This EUA will remain in effect (meaning this test can be used) for the duration of the COVID-19 declaration under Section 564(b)(1) of the Act, 21 U.S.C. section 360bbb-3(b)(1), unless the authorization is terminated or revoked.  Performed at [facility], [address] [place], [place] [address]   Surgical PCR screen     Status: None   Collection Time: 02/24/20  2:45 AM   Specimen: Nasal Mucosa; Nasal Swab  Result Value Ref Range Status   MRSA, PCR NEGATIVE NEGATIVE Final   Staphylococcus aureus NEGATIVE NEGATIVE Final    Comment: (NOTE) The Xpert SA Assay (FDA approved for NASAL specimens in patients 55 years of age and older), is one component of a comprehensive surveillance program. It is not intended to diagnose infection nor to guide or monitor treatment. Performed at [facility], [address], [place], [place] [address]      Radiology Studies: No results found.   LOS: 7 days   Lanae Boast, MD [facility]  02/26/2020, 11:48 AM

## 2020-02-27 ENCOUNTER — Other Ambulatory Visit (HOSPITAL_COMMUNITY): Payer: Self-pay | Admitting: Internal Medicine

## 2020-02-27 DIAGNOSIS — I739 Peripheral vascular disease, unspecified: Secondary | ICD-10-CM | POA: Diagnosis not present

## 2020-02-27 LAB — GLUCOSE, CAPILLARY: Glucose-Capillary: 127 mg/dL — ABNORMAL HIGH (ref 70–99)

## 2020-02-27 MED ORDER — CLOPIDOGREL BISULFATE 75 MG PO TABS: 75.0000 mg | ORAL_TABLET | Freq: Every day | ORAL | 3 refills | Status: AC

## 2020-02-27 MED ORDER — OXYCODONE-ACETAMINOPHEN 5-325 MG PO TABS: 1.0000 | ORAL_TABLET | Freq: Four times a day (QID) | ORAL | 0 refills | Status: DC | PRN

## 2020-02-27 MED ORDER — ROSUVASTATIN CALCIUM 40 MG PO TABS: 40.0000 mg | ORAL_TABLET | Freq: Every day | ORAL | 3 refills | Status: DC

## 2020-02-27 MED ORDER — NICOTINE POLACRILEX 2 MG MT GUM: 2.0000 mg | CHEWING_GUM | OROMUCOSAL | 0 refills | Status: DC | PRN

## 2020-02-27 MED FILL — ROSUVASTATIN CALCIUM 40 MG: 40 | 90 days supply | Qty: 90 | Fill #0

## 2020-02-27 MED FILL — OXYCODONE-APAP 5-325MG: 5-325 | 5 days supply | Qty: 20 | Fill #0

## 2020-02-27 MED FILL — CLOPIDOGREL 75 MG TABLET: 75 | 30 days supply | Qty: 30 | Fill #0

## 2020-02-27 NOTE — TOC Transition Note (Signed)
Transition of Care (TOC) - CM/SW Discharge Note Katherine Pierini RN, BSN Transitions of Care Unit 4E- RN Case Manager See Treatment Team for direct phone #    Patient Details  Name: Katherine Williams MRN: [id number] Date of Birth: 1951-01-03  Transition of Care [facility]) CM/SW Contact:  Katherine Span, RN Phone Number: 02/27/2020, 11:27 AM   Clinical Narrative:    Pt stable for transition home today, have received return call from [facility]-([facility])- spoke with Katherine Williams- they can accept referral with start of care by the weekend. Thayer Ohm is requesting to add [facility] for disease management- msg sent to MD who is agreeable to adding RN to [facility] order- new order faxed to [facility] at [phone] via epic.  Will also fax d/c summary once available- have notified agency of d/c for today for start of care visit needs.   Environmental manager # for [facility]- [phone])     Final next level of care: Home w Home Health Services Barriers to Discharge: Barriers Resolved   Patient Goals and CMS Choice Patient states their goals for this hospitalization and ongoing recovery are:: "return home and be able to clean my house" CMS Medicare.gov Compare Post Acute Care list provided to:: Patient Choice offered to / list presented to : Patient  Discharge Placement               Home with [facility]        Discharge Plan and Services   Discharge Planning Services: CM Consult Post Acute Care Choice: Home Health,Durable Medical Equipment          DME Arranged: N/A DME Agency: NA       HH Arranged: PT,OT HH Agency: [facility] Date [facility] Agency Contacted: 02/27/20 Time HH Agency Contacted: 1000 Representative spoke with at [facility] Agency: Katherine Williams  Social Determinants of Health (SDOH) Interventions     Readmission Risk Interventions Readmission Risk Prevention Plan 02/27/2020  Transportation Screening Complete  PCP or Specialist Appt within 3-5 Days Complete  HRI or Home Care  Consult Complete  Social Work Consult for Recovery Care Planning/Counseling Complete  Palliative Care Screening Not Applicable  Medication Review Oceanographer) Complete  Some recent data might be hidden

## 2020-02-27 NOTE — Progress Notes (Addendum)
  Progress Note    02/27/2020 7:45 AM 3 Days Post-Op  Subjective:  Anticipating discharge today   Vitals:   02/26/20 2317 02/27/20 0316  BP: (!) 153/69 (!) 165/58  Pulse: 86 75  Resp: 18 18  Temp: 98.7 F (37.1 C) 99.6 F (37.6 C)  SpO2: 100% 97%   Physical Exam: Lungs:  Non labored Incisions:  R groin incision c/d/i Extremities:  R ATA and PTA signal by doppler Neurologic: A&O  CBC    Component Value Date/Time   WBC 13.9 (H) 02/26/2020 0633   RBC 3.99 02/26/2020 0633   HGB 9.8 (L) 02/26/2020 0633   HCT 32.1 (L) 02/26/2020 0633   PLT 275 02/26/2020 0633   MCV 80.5 02/26/2020 0633   MCH 24.6 (L) 02/26/2020 0633   MCHC 30.5 02/26/2020 0633   RDW 15.4 02/26/2020 0633   LYMPHSABS 1.3 10/25/2016 2348   MONOABS 0.8 10/25/2016 2348   EOSABS 0.3 10/25/2016 2348   BASOSABS 0.0 10/25/2016 2348    BMET    Component Value Date/Time   NA 136 02/26/2020 0633   K 4.1 02/26/2020 0633   CL 102 02/26/2020 0633   CO2 25 02/26/2020 0633   GLUCOSE 147 (H) 02/26/2020 0633   BUN 14 02/26/2020 0633   CREATININE 1.14 (H) 02/26/2020 0633   CREATININE 0.71 04/30/2015 1259   CALCIUM 8.3 (L) 02/26/2020 0633   GFRNONAA 52 (L) 02/26/2020 0633   GFRAA >60 10/25/2016 2348    INR    Component Value Date/Time   INR 1.00 04/30/2015 1259     Intake/Output Summary (Last 24 hours) at 02/27/2020 0745 Last data filed at 02/26/2020 1300 Gross per 24 hour  Intake 240 ml  Output --  Net 240 ml     Assessment/Plan:  70 y.o. female is s/p R iliac stenting and femoral endarterectomy 3 Days Post-Op   R foot well perfused with ATA and PTA signal by doppler Ready for discharge home with [facility] today Pain medicine and plavix prescription provided Follow up with Dr. Chestine Spore in 3 weeks; office will arrange appt   Emilie Rutter, PA-C [facility] [phone] 02/27/2020 7:45 AM  I have seen and evaluated the patient. I agree with the PA note as documented above.   70 year old female now postop day 3 status post right femoral endarterectomy with profundoplasty and retrograde iliac stenting of her common and external iliac artery.  Continues to have a palpable femoral pulse.  She has monophasic DP PT signals in the right foot.  Rest pain is resolved.  Groin incision looks good.  Can be discharged from my standpoint.  We will arrange follow-up in 3 weeks for iliac duplex and also groin check.  Will need aspirin and Plavix at discharge for dual antiplatelet therapy.  We will arrange follow-up.  We will also send pain meds to her pharmacy.  Cephus Shelling, MD [facility] [place] Office: [phone]

## 2020-03-03 ENCOUNTER — Other Ambulatory Visit: Payer: Self-pay

## 2020-03-03 DIAGNOSIS — Z860100 Personal history of colon polyps, unspecified: Secondary | ICD-10-CM | POA: Insufficient documentation

## 2020-03-03 DIAGNOSIS — K2100 Gastro-esophageal reflux disease with esophagitis, without bleeding: Secondary | ICD-10-CM | POA: Insufficient documentation

## 2020-03-03 DIAGNOSIS — I739 Peripheral vascular disease, unspecified: Secondary | ICD-10-CM

## 2020-03-03 DIAGNOSIS — Z794 Long term (current) use of insulin: Secondary | ICD-10-CM | POA: Insufficient documentation

## 2020-03-04 DIAGNOSIS — K219 Gastro-esophageal reflux disease without esophagitis: Secondary | ICD-10-CM | POA: Diagnosis not present

## 2020-03-04 DIAGNOSIS — E782 Mixed hyperlipidemia: Secondary | ICD-10-CM | POA: Diagnosis not present

## 2020-03-04 DIAGNOSIS — Z7902 Long term (current) use of antithrombotics/antiplatelets: Secondary | ICD-10-CM | POA: Diagnosis not present

## 2020-03-04 DIAGNOSIS — F17210 Nicotine dependence, cigarettes, uncomplicated: Secondary | ICD-10-CM | POA: Diagnosis not present

## 2020-03-04 DIAGNOSIS — J9690 Respiratory failure, unspecified, unspecified whether with hypoxia or hypercapnia: Secondary | ICD-10-CM | POA: Diagnosis not present

## 2020-03-04 DIAGNOSIS — E1151 Type 2 diabetes mellitus with diabetic peripheral angiopathy without gangrene: Secondary | ICD-10-CM | POA: Diagnosis not present

## 2020-03-04 DIAGNOSIS — Z794 Long term (current) use of insulin: Secondary | ICD-10-CM | POA: Diagnosis not present

## 2020-03-04 DIAGNOSIS — Z7982 Long term (current) use of aspirin: Secondary | ICD-10-CM | POA: Diagnosis not present

## 2020-03-04 DIAGNOSIS — I700 Atherosclerosis of aorta: Secondary | ICD-10-CM | POA: Diagnosis not present

## 2020-03-04 DIAGNOSIS — I5032 Chronic diastolic (congestive) heart failure: Secondary | ICD-10-CM | POA: Diagnosis not present

## 2020-03-04 DIAGNOSIS — E1140 Type 2 diabetes mellitus with diabetic neuropathy, unspecified: Secondary | ICD-10-CM | POA: Diagnosis not present

## 2020-03-04 DIAGNOSIS — J449 Chronic obstructive pulmonary disease, unspecified: Secondary | ICD-10-CM | POA: Diagnosis not present

## 2020-03-04 DIAGNOSIS — T82858D Stenosis of vascular prosthetic devices, implants and grafts, subsequent encounter: Secondary | ICD-10-CM | POA: Diagnosis not present

## 2020-03-04 DIAGNOSIS — I110 Hypertensive heart disease with heart failure: Secondary | ICD-10-CM | POA: Diagnosis not present

## 2020-03-04 DIAGNOSIS — M47816 Spondylosis without myelopathy or radiculopathy, lumbar region: Secondary | ICD-10-CM | POA: Diagnosis not present

## 2020-03-05 ENCOUNTER — Other Ambulatory Visit: Payer: Self-pay | Admitting: *Deleted

## 2020-03-05 ENCOUNTER — Encounter: Payer: Self-pay | Admitting: *Deleted

## 2020-03-05 DIAGNOSIS — Z7902 Long term (current) use of antithrombotics/antiplatelets: Secondary | ICD-10-CM | POA: Diagnosis not present

## 2020-03-05 DIAGNOSIS — M47816 Spondylosis without myelopathy or radiculopathy, lumbar region: Secondary | ICD-10-CM | POA: Diagnosis not present

## 2020-03-05 DIAGNOSIS — I5032 Chronic diastolic (congestive) heart failure: Secondary | ICD-10-CM | POA: Diagnosis not present

## 2020-03-05 DIAGNOSIS — E782 Mixed hyperlipidemia: Secondary | ICD-10-CM | POA: Diagnosis not present

## 2020-03-05 DIAGNOSIS — F17210 Nicotine dependence, cigarettes, uncomplicated: Secondary | ICD-10-CM | POA: Diagnosis not present

## 2020-03-05 DIAGNOSIS — E1151 Type 2 diabetes mellitus with diabetic peripheral angiopathy without gangrene: Secondary | ICD-10-CM | POA: Diagnosis not present

## 2020-03-05 DIAGNOSIS — T82858D Stenosis of vascular prosthetic devices, implants and grafts, subsequent encounter: Secondary | ICD-10-CM | POA: Diagnosis not present

## 2020-03-05 DIAGNOSIS — I700 Atherosclerosis of aorta: Secondary | ICD-10-CM | POA: Diagnosis not present

## 2020-03-05 DIAGNOSIS — K219 Gastro-esophageal reflux disease without esophagitis: Secondary | ICD-10-CM | POA: Diagnosis not present

## 2020-03-05 DIAGNOSIS — Z794 Long term (current) use of insulin: Secondary | ICD-10-CM | POA: Diagnosis not present

## 2020-03-05 DIAGNOSIS — I110 Hypertensive heart disease with heart failure: Secondary | ICD-10-CM | POA: Diagnosis not present

## 2020-03-05 DIAGNOSIS — J9690 Respiratory failure, unspecified, unspecified whether with hypoxia or hypercapnia: Secondary | ICD-10-CM | POA: Diagnosis not present

## 2020-03-05 DIAGNOSIS — J449 Chronic obstructive pulmonary disease, unspecified: Secondary | ICD-10-CM | POA: Diagnosis not present

## 2020-03-05 DIAGNOSIS — E1140 Type 2 diabetes mellitus with diabetic neuropathy, unspecified: Secondary | ICD-10-CM | POA: Diagnosis not present

## 2020-03-05 DIAGNOSIS — Z7982 Long term (current) use of aspirin: Secondary | ICD-10-CM | POA: Diagnosis not present

## 2020-03-05 NOTE — Patient Outreach (Addendum)
[facility]) Care Management  03/05/2020  MALEKA CONTINO 05-16-1950 [id number]   [facility] outreach for EMMI-general discharge  03/02/20 RED ON EMMI ALERT Day #   1        Date: Saturday 02/29/20 1001  Red Alert Reason: transportation to follow up? no  And   03/04/20 RED ON EMMI ALERT Day #    4       Date: Tuesday 03/03/20 1012 Red Alert Reason: Lost interest in things? Yes Sad/hopeless/anxious/empty? Yes  Insurance:  [employer] [facility])  [facility] admissions x 1  ED visits x 1 in the last 6 months  Admission 02/19/20 to 02/27/20 s/p R CFA endarterectomy with common and external iliac artery stenting  [facility]charged [facility] PT/OT   Outreach attempt #1 successful at [phone]  Patient is able to verify HIPAA identifiers [facility] Care Management RN reviewed and addressed red alert with patient  Consent: [facility] RN CM reviewed [facility] services with patient. Patient gave verbal consent for services [facility] telephonic RN CM.  Advised patient that there will be further automated EMMI- post discharge calls to assess how the patient is doing following the recent hospitalization Advised the patient that another call may be received from a nurse if any of their responses were abnormal. Patient voiced understanding and was appreciative of f/u call.   EMMI:  Pt and daughter states the EMMI responses are correct Reports schedule conflict with upcoming vascular surgeon appointment on 03/24/20 0840 3 wk f/u w/ MD; R Iliac Artery Duplex; s/p R iliac stent and femoral endarterectomy; 02/27/20  They would like for the appointment with Dr Chestine Spore to be either later or closer to patient home address  Santina Evans, Daughter, will be transporting patient to appointments but will need appointments at a time after assisting catherine's 70 year old daughter   She reports her symptoms of depression "are no different than usual. I am okay" She denies need of [facility] SW or resources Depression  screen [facility] 2/9 03/05/2020  Decreased Interest 1  Down, Depressed, Hopeless 1  PHQ - 2 Score 2  Altered sleeping 0  Tired, decreased energy 1  Change in appetite 0  Feeling bad or failure about yourself  0  Trouble concentrating 0  Moving slowly or fidgety/restless 0  Suicidal thoughts 0  PHQ-9 Score 3  Difficult doing work/chores Somewhat difficult     Social: ALAYIA MEGGISON is a 70 year old patient who was living alone. She is staying at daughter, Catherine's home since discharge with catherine 71 daughter Current every day smoker  Past Medical History:  Diagnosis Date  . CHF (congestive heart failure) (HCC)   . COPD (chronic obstructive pulmonary disease) (HCC)   . Diabetes mellitus without complication (HCC)   . Ganglion cyst   . Headache   . Heart murmur   . Hypertension   . Insulin pump in place   . Neuropathy in diabetes (HCC)   . Peripheral vascular disease (HCC)    a. prior stenting, right Fem-Pop bypass graft b. 04/2015: s/p PTA and stenting of R external iliac  . Respiratory failure (HCC)   . Shortness of breath dyspnea    Patient Active Problem List   Diagnosis Date Noted  . Gastroesophageal reflux disease with esophagitis 03/03/2020  . Long term (current) use of insulin (HCC) 03/03/2020  . Personal history of colonic polyps 03/03/2020  . Microcytic anemia 02/22/2020  . Morbid obesity (HCC) 02/20/2020  . PVD (peripheral  vascular disease) with claudication (HCC) 02/19/2020  . Class 2 severe obesity due to excess calories with serious comorbidity and body mass index (BMI) of 38.0 to 38.9 in adult (HCC) 08/29/2016  . Chronic diastolic heart failure (HCC) 06/08/2015  . Claudication (HCC) 05/07/2015  . Pain in the chest   . Chest pain   . DM type 2 causing vascular disease (HCC)   . Hypertensive urgency 02/24/2015  . Hypokalemia 02/24/2015  . Elevated troponin 02/24/2015  . Asthma exacerbation 02/23/2015  . COPD exacerbation (HCC) 02/23/2015  . LEG PAIN,  BILATERAL 05/25/2006  . CHEST PAIN, ATYPICAL, HX OF 02/23/2006  . Mixed hyperlipidemia 12/21/2005  . Current smoker 12/21/2005  . SYNDROME, RESTLESS LEGS 12/21/2005  . SYNDROME, CARPAL TUNNEL 12/21/2005  . Essential hypertension 12/21/2005  . PERIPHERAL VASCULAR DISEASE 12/21/2005  . GERD 12/21/2005  . OSTEOARTHRITIS 12/21/2005  . HIP PAIN, CHRONIC 12/21/2005    DME: walker , eyeglasses, insulin pump, true Metrix glucometer   Plan: Patient agrees to the care plan and follow up within the next 7-10 business days Pt encouraged to return a call to [facility] RN CM prn Provided [facility] RN CM & [facility] 24 hr nurse call center numbers Routed note to MD  Goals Addressed              This Visit's Progress     Patient Stated   .  [facility]) Make and Keep All Appointments (pt-stated)   Not on track     Timeframe:  Short-Term Goal Priority:  High Start Date:               03/05/20              Expected End Date:           04/13/20            Follow Up Date 03/09/20   - ask family or friend for a ride - call to cancel if needed       Notes: 03/05/20 Reports schedule conflict with upcoming vascular surgeon appointment on 03/24/20 0840 3 wk f/u w/ MD; R Iliac Artery Duplex; s/p R iliac stent and femoral endarterectomy; 02/27/20  They would like for the appointment with Dr Chestine Spore to be either later or closer to patient home address     .  [facility]) Manage My Emotions (pt-stated)   On track     Timeframe:  Short-Term Goal Priority:  Medium Start Date:                           03/05/20  Expected End Date:    04/13/20                   Follow Up Date 03/09/20 - talk about feelings with a friend, family or spiritual advisor - practice positive thinking and self-talk     Notes:  03/05/20 She is voicing positive statements She reports her symptoms of depression "are no different than usual. I am okay" She denies need of [facility] SW or resources       Gerlach L. [name], RN, BSN, CCM [facility] Telephonic Care  Management Care Coordinator Office number [phone] Mobile number [phone]  Main [facility] number [phone] Fax number [phone]

## 2020-03-06 ENCOUNTER — Other Ambulatory Visit: Payer: Self-pay | Admitting: *Deleted

## 2020-03-06 ENCOUNTER — Telehealth: Payer: Self-pay

## 2020-03-06 NOTE — Patient Outreach (Signed)
[facility]) Care Management  03/06/2020  Katherine Williams 1950/03/30 [id number]   [facility] Care coordination- Upcoming vascular surgeon appointment  [facility] RN CM outreached to Dr Chestine Spore office [phone] Left a message for RN on clinician line related to patient voiced concern to have RN address with Dr Chestine Spore. Left [facility] RN CM office number for a return call [facility] RN CM outreached to Dr Chestine Spore office and spoke with [facility] appointment scheduler about [name] preference to be seen later for appointment and/or by a provider closer to her home  [name] shared that the 03/24/20 0840 appointment would be for a fasting ultrasound and lab, Dr Chestine Spore is only present on Tuesdays and Preference is for post op appointment to be done by same provider.  Plan [facility] RN CM will follow up with pt within the next 7-10 business days and continue to collaborate with Dr Chestine Spore staff/RN pending a return call  Goals Addressed              This Visit's Progress     Patient Stated     [facility]) Make and Keep All Appointments (pt-stated)        Timeframe:  Short-Term Goal Priority:  High Start Date:               03/05/20              Expected End Date:           04/13/20            Follow Up Date 03/09/20   - ask family or friend for a ride - call to cancel if needed     Notes: 03/06/20 outreach to Dr Chestine Spore Left message for RN, collateral info from scheduler Pending RN return call after consulting with Dr Chestine Spore 03/05/20 Reports schedule conflict with upcoming vascular surgeon appointment on 03/24/20 0840 3 wk f/u w/ MD; R Iliac Artery Duplex; s/p R iliac stent and femoral endarterectomy; 02/27/20  They would like for the appointment with Dr Chestine Spore to be either later or closer to patient home address         [name], RN, BSN, CCM [facility] Telephonic Care Management Care Coordinator Office number [phone] Mobile number [phone]  Main [facility] number [phone] Fax number [phone]

## 2020-03-06 NOTE — Telephone Encounter (Signed)
Changed patient's post op follow up appt due to transportation issues. She knows the fasting study done later in the morning will require her to be NPO longer.

## 2020-03-06 NOTE — Patient Outreach (Signed)
[facility]) Care Management  03/06/2020  JENNEA RAGER [birth year] [id number]  [facility] care coordination and Outreach to [facility] referred patient   03/02/20 RED ON EMMI ALERT Day #   1        Date: Saturday 02/29/20 1001  Red Alert Reason: transportation to follow up? no  And   03/04/20 RED ON EMMI ALERT Day #    4       Date: Tuesday 03/03/20 1012 Red Alert Reason: Lost interest in things? Yes Sad/hopeless/anxious/empty? Yes  Insurance:  [place] [facility]) medicare advantage [employer]   [facility] admissions x 1  ED visits x 1 in the last 6 months  Admission 02/19/20 to 02/27/20 s/p R CFA endarterectomy with common and external iliac artery stenting  [place] [facility]    Incoming outreach from Dr Chestine Spore RN/Follow up with patient Incoming call from Trinna Post, Dr [name] RN to review pt concern with changing patient post op/ lab appointment.  [facility] RN CM completed a conference call with Mrs Carusone and Dannielle Burn attempt #2 successful at [phone]  Patient is able to verify HIPAA identifiers [facility] Care Management RN reviewed and addressed red alertwith patient Consent: [facility] RN CM reviewed [facility] services with patient. Patient gave verbal consent for services [facility] telephonic RN CM.   [name] discussed and answered questions for Mrs Luckow about other vascular surgery providers, ans assisted with rescheduling the appointment to allow the patient time to schedule [employer] transportation services to assist her to get to an early morning appointment  The next available appointment was scheduled for 04/14/20 at 1045-1100 for fasting lab, scan and follow up Pt denies issues with her surgery site and [name] informed her x 2 to outreach if surgery site issues occur She reports she is keeping it clean and dry  Pt was use to driving her self in her truck She is open to changing her appointment with Dr Chestine Spore to 04/14/20 1045 with transportation assist from  [employer]  Conference Outreach with Mrs Willis to [employer] transportation services at [phone]  Spoke with Thayer Ohm to have patient questions about her transportation benefit answered Pt agreed to follow [facility] outreach  Mrs Loch verified her primary care provider (PCP) She confirms [place] [facility] visits are active  Hypertension (HTN) She has a pcp monitored BP device at home and will be returning to home "today" 03/06/20 with her daughter assist per pt  [facility] RN CM will follow upwith pt within the next 30-45 business days  Pt encouraged to return a call to [facility] RN CM or 24 hr RN line prn Goals Addressed              This Visit's Progress     Patient Stated   .  [facility]) Make and Keep All Appointments (pt-stated)   On track     Timeframe:  Short-Term Goal Priority:  High Start Date:               03/05/20              Expected End Date:           04/13/20            Follow Up Date 03/09/20   - ask family or friend for a ride - call to cancel if needed       Notes: 03/06/20 outreach to Dr Chestine Spore  Left message for RN, collateral info from scheduler Trinna Post RN returned call after consulting with Dr Chestine Spore Pt prefer to change appt to 04/14/20 1045-11 and use [employer] transportation to get to appointment  03/05/20 Reports schedule conflict with upcoming vascular surgeon appointment on 03/24/20 0840 3 wk f/u w/ MD; R Iliac Artery Duplex; s/p R iliac stent and femoral endarterectomy; 02/27/20  They would like for the appointment with Dr Chestine Spore to be either later or closer to patient home address        [name], RN, BSN, CCM [facility] Telephonic Care Management Care Coordinator Office number [phone] [facility] number [phone] Fax number [phone]

## 2020-03-09 DIAGNOSIS — Z794 Long term (current) use of insulin: Secondary | ICD-10-CM | POA: Diagnosis not present

## 2020-03-09 DIAGNOSIS — E782 Mixed hyperlipidemia: Secondary | ICD-10-CM | POA: Diagnosis not present

## 2020-03-09 DIAGNOSIS — I110 Hypertensive heart disease with heart failure: Secondary | ICD-10-CM | POA: Diagnosis not present

## 2020-03-09 DIAGNOSIS — E1140 Type 2 diabetes mellitus with diabetic neuropathy, unspecified: Secondary | ICD-10-CM | POA: Diagnosis not present

## 2020-03-09 DIAGNOSIS — I700 Atherosclerosis of aorta: Secondary | ICD-10-CM | POA: Diagnosis not present

## 2020-03-09 DIAGNOSIS — Z7982 Long term (current) use of aspirin: Secondary | ICD-10-CM | POA: Diagnosis not present

## 2020-03-09 DIAGNOSIS — J449 Chronic obstructive pulmonary disease, unspecified: Secondary | ICD-10-CM | POA: Diagnosis not present

## 2020-03-09 DIAGNOSIS — I5032 Chronic diastolic (congestive) heart failure: Secondary | ICD-10-CM | POA: Diagnosis not present

## 2020-03-09 DIAGNOSIS — Z7902 Long term (current) use of antithrombotics/antiplatelets: Secondary | ICD-10-CM | POA: Diagnosis not present

## 2020-03-09 DIAGNOSIS — T82858D Stenosis of vascular prosthetic devices, implants and grafts, subsequent encounter: Secondary | ICD-10-CM | POA: Diagnosis not present

## 2020-03-09 DIAGNOSIS — F17210 Nicotine dependence, cigarettes, uncomplicated: Secondary | ICD-10-CM | POA: Diagnosis not present

## 2020-03-09 DIAGNOSIS — J9690 Respiratory failure, unspecified, unspecified whether with hypoxia or hypercapnia: Secondary | ICD-10-CM | POA: Diagnosis not present

## 2020-03-09 DIAGNOSIS — K219 Gastro-esophageal reflux disease without esophagitis: Secondary | ICD-10-CM | POA: Diagnosis not present

## 2020-03-09 DIAGNOSIS — E1151 Type 2 diabetes mellitus with diabetic peripheral angiopathy without gangrene: Secondary | ICD-10-CM | POA: Diagnosis not present

## 2020-03-09 DIAGNOSIS — M47816 Spondylosis without myelopathy or radiculopathy, lumbar region: Secondary | ICD-10-CM | POA: Diagnosis not present

## 2020-03-12 DIAGNOSIS — T82858D Stenosis of vascular prosthetic devices, implants and grafts, subsequent encounter: Secondary | ICD-10-CM | POA: Diagnosis not present

## 2020-03-12 DIAGNOSIS — E1151 Type 2 diabetes mellitus with diabetic peripheral angiopathy without gangrene: Secondary | ICD-10-CM | POA: Diagnosis not present

## 2020-03-12 DIAGNOSIS — I700 Atherosclerosis of aorta: Secondary | ICD-10-CM | POA: Diagnosis not present

## 2020-03-12 DIAGNOSIS — I5032 Chronic diastolic (congestive) heart failure: Secondary | ICD-10-CM | POA: Diagnosis not present

## 2020-03-12 DIAGNOSIS — E1140 Type 2 diabetes mellitus with diabetic neuropathy, unspecified: Secondary | ICD-10-CM | POA: Diagnosis not present

## 2020-03-12 DIAGNOSIS — E782 Mixed hyperlipidemia: Secondary | ICD-10-CM | POA: Diagnosis not present

## 2020-03-12 DIAGNOSIS — Z7982 Long term (current) use of aspirin: Secondary | ICD-10-CM | POA: Diagnosis not present

## 2020-03-12 DIAGNOSIS — K219 Gastro-esophageal reflux disease without esophagitis: Secondary | ICD-10-CM | POA: Diagnosis not present

## 2020-03-12 DIAGNOSIS — Z794 Long term (current) use of insulin: Secondary | ICD-10-CM | POA: Diagnosis not present

## 2020-03-12 DIAGNOSIS — I110 Hypertensive heart disease with heart failure: Secondary | ICD-10-CM | POA: Diagnosis not present

## 2020-03-12 DIAGNOSIS — F17210 Nicotine dependence, cigarettes, uncomplicated: Secondary | ICD-10-CM | POA: Diagnosis not present

## 2020-03-12 DIAGNOSIS — J9690 Respiratory failure, unspecified, unspecified whether with hypoxia or hypercapnia: Secondary | ICD-10-CM | POA: Diagnosis not present

## 2020-03-12 DIAGNOSIS — J449 Chronic obstructive pulmonary disease, unspecified: Secondary | ICD-10-CM | POA: Diagnosis not present

## 2020-03-12 DIAGNOSIS — M47816 Spondylosis without myelopathy or radiculopathy, lumbar region: Secondary | ICD-10-CM | POA: Diagnosis not present

## 2020-03-12 DIAGNOSIS — Z7902 Long term (current) use of antithrombotics/antiplatelets: Secondary | ICD-10-CM | POA: Diagnosis not present

## 2020-03-14 DIAGNOSIS — M25511 Pain in right shoulder: Secondary | ICD-10-CM | POA: Diagnosis not present

## 2020-03-14 DIAGNOSIS — I739 Peripheral vascular disease, unspecified: Secondary | ICD-10-CM | POA: Diagnosis not present

## 2020-03-14 DIAGNOSIS — E1165 Type 2 diabetes mellitus with hyperglycemia: Secondary | ICD-10-CM | POA: Diagnosis not present

## 2020-03-14 DIAGNOSIS — E782 Mixed hyperlipidemia: Secondary | ICD-10-CM | POA: Diagnosis not present

## 2020-03-14 DIAGNOSIS — K219 Gastro-esophageal reflux disease without esophagitis: Secondary | ICD-10-CM | POA: Diagnosis not present

## 2020-03-14 DIAGNOSIS — I10 Essential (primary) hypertension: Secondary | ICD-10-CM | POA: Diagnosis not present

## 2020-03-14 DIAGNOSIS — M25512 Pain in left shoulder: Secondary | ICD-10-CM | POA: Diagnosis not present

## 2020-03-14 DIAGNOSIS — J449 Chronic obstructive pulmonary disease, unspecified: Secondary | ICD-10-CM | POA: Diagnosis not present

## 2020-03-14 DIAGNOSIS — I6523 Occlusion and stenosis of bilateral carotid arteries: Secondary | ICD-10-CM | POA: Diagnosis not present

## 2020-03-18 DIAGNOSIS — Z794 Long term (current) use of insulin: Secondary | ICD-10-CM | POA: Diagnosis not present

## 2020-03-18 DIAGNOSIS — E119 Type 2 diabetes mellitus without complications: Secondary | ICD-10-CM | POA: Diagnosis not present

## 2020-03-19 DIAGNOSIS — T82858D Stenosis of vascular prosthetic devices, implants and grafts, subsequent encounter: Secondary | ICD-10-CM | POA: Diagnosis not present

## 2020-03-19 DIAGNOSIS — F17210 Nicotine dependence, cigarettes, uncomplicated: Secondary | ICD-10-CM | POA: Diagnosis not present

## 2020-03-19 DIAGNOSIS — E782 Mixed hyperlipidemia: Secondary | ICD-10-CM | POA: Diagnosis not present

## 2020-03-19 DIAGNOSIS — E1151 Type 2 diabetes mellitus with diabetic peripheral angiopathy without gangrene: Secondary | ICD-10-CM | POA: Diagnosis not present

## 2020-03-19 DIAGNOSIS — J449 Chronic obstructive pulmonary disease, unspecified: Secondary | ICD-10-CM | POA: Diagnosis not present

## 2020-03-19 DIAGNOSIS — I5032 Chronic diastolic (congestive) heart failure: Secondary | ICD-10-CM | POA: Diagnosis not present

## 2020-03-19 DIAGNOSIS — E1140 Type 2 diabetes mellitus with diabetic neuropathy, unspecified: Secondary | ICD-10-CM | POA: Diagnosis not present

## 2020-03-19 DIAGNOSIS — M47816 Spondylosis without myelopathy or radiculopathy, lumbar region: Secondary | ICD-10-CM | POA: Diagnosis not present

## 2020-03-19 DIAGNOSIS — Z7902 Long term (current) use of antithrombotics/antiplatelets: Secondary | ICD-10-CM | POA: Diagnosis not present

## 2020-03-19 DIAGNOSIS — I110 Hypertensive heart disease with heart failure: Secondary | ICD-10-CM | POA: Diagnosis not present

## 2020-03-19 DIAGNOSIS — Z794 Long term (current) use of insulin: Secondary | ICD-10-CM | POA: Diagnosis not present

## 2020-03-19 DIAGNOSIS — Z7982 Long term (current) use of aspirin: Secondary | ICD-10-CM | POA: Diagnosis not present

## 2020-03-19 DIAGNOSIS — K219 Gastro-esophageal reflux disease without esophagitis: Secondary | ICD-10-CM | POA: Diagnosis not present

## 2020-03-19 DIAGNOSIS — J9690 Respiratory failure, unspecified, unspecified whether with hypoxia or hypercapnia: Secondary | ICD-10-CM | POA: Diagnosis not present

## 2020-03-19 DIAGNOSIS — I700 Atherosclerosis of aorta: Secondary | ICD-10-CM | POA: Diagnosis not present

## 2020-03-20 DIAGNOSIS — Z7902 Long term (current) use of antithrombotics/antiplatelets: Secondary | ICD-10-CM | POA: Diagnosis not present

## 2020-03-20 DIAGNOSIS — T82858D Stenosis of vascular prosthetic devices, implants and grafts, subsequent encounter: Secondary | ICD-10-CM | POA: Diagnosis not present

## 2020-03-20 DIAGNOSIS — K219 Gastro-esophageal reflux disease without esophagitis: Secondary | ICD-10-CM | POA: Diagnosis not present

## 2020-03-20 DIAGNOSIS — E782 Mixed hyperlipidemia: Secondary | ICD-10-CM | POA: Diagnosis not present

## 2020-03-20 DIAGNOSIS — M47816 Spondylosis without myelopathy or radiculopathy, lumbar region: Secondary | ICD-10-CM | POA: Diagnosis not present

## 2020-03-20 DIAGNOSIS — J9690 Respiratory failure, unspecified, unspecified whether with hypoxia or hypercapnia: Secondary | ICD-10-CM | POA: Diagnosis not present

## 2020-03-20 DIAGNOSIS — I700 Atherosclerosis of aorta: Secondary | ICD-10-CM | POA: Diagnosis not present

## 2020-03-20 DIAGNOSIS — I110 Hypertensive heart disease with heart failure: Secondary | ICD-10-CM | POA: Diagnosis not present

## 2020-03-20 DIAGNOSIS — E1140 Type 2 diabetes mellitus with diabetic neuropathy, unspecified: Secondary | ICD-10-CM | POA: Diagnosis not present

## 2020-03-20 DIAGNOSIS — I5032 Chronic diastolic (congestive) heart failure: Secondary | ICD-10-CM | POA: Diagnosis not present

## 2020-03-20 DIAGNOSIS — F17210 Nicotine dependence, cigarettes, uncomplicated: Secondary | ICD-10-CM | POA: Diagnosis not present

## 2020-03-20 DIAGNOSIS — E1151 Type 2 diabetes mellitus with diabetic peripheral angiopathy without gangrene: Secondary | ICD-10-CM | POA: Diagnosis not present

## 2020-03-20 DIAGNOSIS — Z7982 Long term (current) use of aspirin: Secondary | ICD-10-CM | POA: Diagnosis not present

## 2020-03-20 DIAGNOSIS — Z794 Long term (current) use of insulin: Secondary | ICD-10-CM | POA: Diagnosis not present

## 2020-03-20 DIAGNOSIS — J449 Chronic obstructive pulmonary disease, unspecified: Secondary | ICD-10-CM | POA: Diagnosis not present

## 2020-03-24 ENCOUNTER — Encounter (HOSPITAL_COMMUNITY): Payer: Medicare Other

## 2020-03-24 ENCOUNTER — Encounter: Payer: Medicare Other | Admitting: Vascular Surgery

## 2020-04-06 DIAGNOSIS — Z712 Person consulting for explanation of examination or test findings: Secondary | ICD-10-CM | POA: Diagnosis not present

## 2020-04-06 DIAGNOSIS — K219 Gastro-esophageal reflux disease without esophagitis: Secondary | ICD-10-CM | POA: Diagnosis not present

## 2020-04-06 DIAGNOSIS — E1165 Type 2 diabetes mellitus with hyperglycemia: Secondary | ICD-10-CM | POA: Diagnosis not present

## 2020-04-09 ENCOUNTER — Other Ambulatory Visit: Payer: Self-pay

## 2020-04-09 ENCOUNTER — Other Ambulatory Visit: Payer: Self-pay | Admitting: *Deleted

## 2020-04-09 NOTE — Patient Outreach (Signed)
[facility]) Care Management  04/09/2020  Katherine Williams 03/21/1950 [id number]   [facility] follow up for EMMI referred patient  Katherine Williams was referred to [facility] for EMMI general discharge services  1/17/22RED ON EMMI ALERT Day #1Date: Saturday 02/29/20 1001 Red Alert Reason:transportation to follow up? no  And   1/19/22RED ON EMMI ALERT Day #4Date: Tuesday 03/03/20 [place] Reason: Lost interest in things? Yes Sad/hopeless/anxious/empty? Yes  Insurance:[employer] [facility]) medicare advantage [employer]   [facility] admissions x1ED visits x 1in the last 6 months  Admission 02/19/20 to 02/27/20 s/p R CFA endarterectomy with common and external iliac artery stenting  Discharged with [place] [facility] health PT/OT  Successful follow up outreach  Patient is able to verify HIPAA ([place]) identifiers Reviewed and addressed the purpose of the follow up call with the patient  Consent: [facility] RN CM reviewed [facility] services with patient. Patient gave verbal consent for services.  Follow EMMI services  Transportation and Coping issues resolved with review of scheduled 04/14/20 10/45 hospital vascular surgery appointment to return to see Dr Carlis Abbott for fasting lab, scan Assisted Katherine Williams to outreach to [facility] transportation [phone] Katherine Williams is able to find this information on her [facility] card Spoke with Romel who assisted with scheduling transportation with a pick up at -[phone] and at 46 by [place] transportation [phone] Katherine Williams was encouraged to outreach if she experiences any concerns with this transportation discussed [facility] transportation Encouragement to go to 04/14/20 appointment provided. Encouraged to collaborate and speak with vascular surgeon about future local follow up appointments prn  Coping  Katherine Williams reports that since she has returned to her home her  loss of interest and depression symptoms have resolved Katherine Williams daughter and daughter have visited  They have assisted with all local transportation and errands Her extended family have outreached  She enjoys the company of her dog Katherine Williams [facility] services for coping for any future concerns  [facility]ans [facility] RN CM will follow up with pt within the next 7-14 business days Pt encouraged to return a call to [facility] RN CM prn Goals Addressed              This Visit's Progress     Patient Stated   .  [facility]) Find Help in My Community (pt-stated)   On track     Timeframe:  Short-Term Goal Priority:  High Start Date:          03/06/20                   Expected End Date:  04/28/20                     Follow Up Date 04/09/20   - begin a notebook of services in my neighborhood or community - follow-up on any referrals for help I am given - have a back-up plan - make a list of family or friends that I can call     Notes:  04/09/20 worked with [facility] RN CM to schedule transportation for 04/14/20 vascular surgery follow up appointment voiced understanding of education of [facility] transportation services 03/06/20 after consulting with Dr Ainsley Spinner RN, [name] Pt prefer to change appt to 04/14/20 1045-11 and use [employer] transportation to get to appointment Conference with her to [facility] transportation [place]) Education on transportation benefit provided     .  [facility]) Make  and Keep All Appointments (pt-stated)   On track     Timeframe:  Short-Term Goal Priority:  High Start Date:               03/05/20              Expected End Date:           04/13/20            Follow Up Date 04/09/20   - ask family or friend for a ride - call to cancel if needed       Notes: 04/09/20 agreed to attend appointment on 04/14/20 to Dr Carlis Abbott after assisted to get [facility] transportation scheduled and will speak with him about more local follow up appointments prn  03/06/20 outreach to Dr Carlis Abbott Left message for RN, collateral info from [place] returned  call after consulting with Dr Carlis Abbott Pt prefer to change appt to 04/14/20 1045-11 and use [employer] transportation to get to appointment  03/05/20 Reports schedule conflict with upcoming vascular surgeon appointment on 03/24/20 0840 3 wk f/u w/ MD; R Iliac Artery Duplex; s/p R iliac stent and femoral endarterectomy; 02/27/20  They would like for the appointment with Dr Carlis Abbott to be either later or closer to patient home address     .  COMPLETED: [facility]) Manage My Emotions (pt-stated)   On track     Timeframe:  Short-Term Goal Priority:  Medium Start Date:                           03/05/20  Expected End Date:    04/13/20                   Follow Up Date 04/09/20 - call and visit an old friend - laugh; watch a funny movie or comedian - talk about feelings with a friend, family or spiritual advisor - practice positive thinking and self-talk       Notes:  04/09/20 Goal met- reports that since she has returned to her home her loss of interest and depression symptoms have resolved 03/05/20 She is voicing positive statements She reports her symptoms of depression "are no different than usual. I am okay" She denies need of [facility] SW or resources       [place] L. Lavina Hamman, RN, BSN, [place] Coordinator Office number [phone] Main [facility] number [phone] Fax number [phone]

## 2020-04-13 DIAGNOSIS — M25512 Pain in left shoulder: Secondary | ICD-10-CM | POA: Diagnosis not present

## 2020-04-13 DIAGNOSIS — J449 Chronic obstructive pulmonary disease, unspecified: Secondary | ICD-10-CM | POA: Diagnosis not present

## 2020-04-13 DIAGNOSIS — M25511 Pain in right shoulder: Secondary | ICD-10-CM | POA: Diagnosis not present

## 2020-04-13 DIAGNOSIS — I6523 Occlusion and stenosis of bilateral carotid arteries: Secondary | ICD-10-CM | POA: Diagnosis not present

## 2020-04-13 DIAGNOSIS — I10 Essential (primary) hypertension: Secondary | ICD-10-CM | POA: Diagnosis not present

## 2020-04-13 DIAGNOSIS — K219 Gastro-esophageal reflux disease without esophagitis: Secondary | ICD-10-CM | POA: Diagnosis not present

## 2020-04-13 DIAGNOSIS — E782 Mixed hyperlipidemia: Secondary | ICD-10-CM | POA: Diagnosis not present

## 2020-04-13 DIAGNOSIS — I739 Peripheral vascular disease, unspecified: Secondary | ICD-10-CM | POA: Diagnosis not present

## 2020-04-13 DIAGNOSIS — E1165 Type 2 diabetes mellitus with hyperglycemia: Secondary | ICD-10-CM | POA: Diagnosis not present

## 2020-04-14 ENCOUNTER — Other Ambulatory Visit: Payer: Self-pay

## 2020-04-14 ENCOUNTER — Encounter: Payer: Self-pay | Admitting: Vascular Surgery

## 2020-04-14 ENCOUNTER — Ambulatory Visit (HOSPITAL_COMMUNITY)
Admission: RE | Admit: 2020-04-14 | Discharge: 2020-04-14 | Disposition: A | Discharge status: Home / Self Care | Payer: Medicare Other | Source: Ambulatory Visit | Attending: Vascular Surgery | Admitting: Vascular Surgery

## 2020-04-14 ENCOUNTER — Ambulatory Visit (INDEPENDENT_AMBULATORY_CARE_PROVIDER_SITE_OTHER): Payer: Medicare Other | Admitting: Vascular Surgery

## 2020-04-14 VITALS — BP 162/75 | HR 67 | Temp 98.1°F | Resp 14 | Ht 64.0 in | Wt 214.0 lb

## 2020-04-14 DIAGNOSIS — I739 Peripheral vascular disease, unspecified: Secondary | ICD-10-CM | POA: Insufficient documentation

## 2020-04-14 DIAGNOSIS — R6889 Other general symptoms and signs: Secondary | ICD-10-CM | POA: Diagnosis not present

## 2020-04-14 MED ORDER — GABAPENTIN 300 MG PO CAPS: 300.0000 mg | ORAL_CAPSULE | Freq: Every day | ORAL | 5 refills | Status: AC

## 2020-04-14 NOTE — Progress Notes (Signed)
Patient name: Katherine Williams MRN: [id number] DOB: Jun 29, 1950 Sex: female  REASON FOR VISIT: Postop check  HPI: Katherine Williams is a 70 y.o. female with history of hypertension, diabetes, obesity, COPD, tobacco abuse who presents for postop check.  I recently performed a right common femoral endarterectomy with profundoplasty and retrograde stenting of the right common and external iliac arteries on 02/24/2020.  She had presented as a transfer from [facility] with critical limb ischemia of the right foot with rest pain and ABIs of 0.    Overall today states her right foot is improved but not perfect.  She does have some tingling in the toes that has been ongoing.   She previously had a left common femoral endarterectomy with a left common femoral to above-knee popliteal bypass in 2006 by Dr. Donnetta Hutching.  Patient states she also had a right leg bypass that only lasted one day and then failed.  Most recently she had a right external iliac stent on 05/07/2015 by Dr. Gwenlyn Found.   Past Medical History:  Diagnosis Date  . CHF (congestive heart failure) ([place])   . COPD (chronic obstructive pulmonary disease) ([place])   . Diabetes mellitus without complication ([place])   . Ganglion cyst   . Headache   . Heart murmur   . Hypertension   . Insulin pump in place   . Neuropathy in diabetes ([place])   . Peripheral vascular disease ([place])    a. prior stenting, right Fem-Pop bypass graft b. 04/2015: s/p PTA and stenting of R external iliac  . Respiratory failure ([place])   . Shortness of breath dyspnea     Past Surgical History:  Procedure Laterality Date  . CARPAL TUNNEL RELEASE    . ENDARTERECTOMY FEMORAL Right 02/24/2020   Procedure: RIGHT FEMORAL ENDARTERECTOMY WITH PROFUNDAPLASTY;  Surgeon: Marty Heck, MD;  Location: [place];  Service: Vascular;  Laterality: Right;  . FEMORAL-FEMORAL BYPASS GRAFT     right and left legs  . ILIAC VEIN ANGIOPLASTY / STENTING Right 05/07/2015  . INSERTION OF ILIAC STENT Right  02/24/2020   Procedure: RETROGRADE STENTING OF RIGHT COMMON AND EXTERNAL ILIAC ARTERIES;  Surgeon: Marty Heck, MD;  Location: [place];  Service: Vascular;  Laterality: Right;  . INTRAOPERATIVE ARTERIOGRAM Bilateral 02/24/2020   Procedure: ABDOMINAL AORTAGRAM WITH ILIAC ARTERIOGRAM;  Surgeon: Marty Heck, MD;  Location: [place];  Service: Vascular;  Laterality: Bilateral;  . PATCH ANGIOPLASTY Right 02/24/2020   Procedure: PATCH ANGIOPLASTY OF RIGHT FEMROAL ARTERY USING XENSOURE BOVINE PERICARDIUM PATCH;  Surgeon: Marty Heck, MD;  Location: [place];  Service: Vascular;  Laterality: Right;  . PERIPHERAL VASCULAR CATHETERIZATION N/A 05/07/2015   Procedure: Abdominal Aortogram;  Surgeon: Lorretta Harp, MD;  Location: [place] CV LAB;  Service: Cardiovascular;  Laterality: N/A;  . PERIPHERAL VASCULAR CATHETERIZATION Bilateral 05/07/2015   Procedure: Lower Extremity Angiography;  Surgeon: Lorretta Harp, MD;  Location: [place] CV LAB;  Service: Cardiovascular;  Laterality: Bilateral;  . PERIPHERAL VASCULAR CATHETERIZATION Right 05/07/2015   Procedure: Peripheral Vascular Intervention;  Surgeon: Lorretta Harp, MD;  Location: [place] CV LAB;  Service: Cardiovascular;  Laterality: Right;  ext iliac  . ULTRASOUND GUIDANCE FOR VASCULAR ACCESS Left 02/24/2020   Procedure: ULTRASOUND GUIDANCE FOR VASCULAR ACCESS, LEFT FEMORAL ARTERY;  Surgeon: Marty Heck, MD;  [facility];  Service: Vascular;  Laterality: Left;    Family History  Problem Relation Age of Onset  . Congenital heart disease Mother   .  Heart attack Maternal Grandmother   . Heart disease Paternal Grandfather   . Diabetes Daughter     SOCIAL HISTORY: Social History   Tobacco Use  . Smoking status: Current Every Day Smoker    Packs/day: 1.00    Years: 45.00    Pack years: 45.00    Types: Cigarettes    Start date: 06/29/1965  . Smokeless tobacco: Never Used  Substance Use Topics  . Alcohol  use: Yes    Comment: occasionally    Allergies  Allergen Reactions  . Lisinopril-Hydrochlorothiazide Hives and Itching  . Penicillins     Other reaction(s): Unknown  . Sulfa Antibiotics     Other reaction(s): Unknown  . Sulfonamide Derivatives Other (See Comments)    Hives    Current Outpatient Medications  Medication Sig Dispense Refill  . albuterol (PROVENTIL) (2.5 MG/3ML) 0.083% nebulizer solution     . amLODipine (NORVASC) 10 MG tablet Take 1 tablet (10 mg total) by mouth daily. 30 tablet 1  . aspirin EC 81 MG EC tablet Take 1 tablet (81 mg total) by mouth daily.    . Blood Glucose Monitoring Suppl (TRUE METRIX METER) w/Device KIT     . clopidogrel (PLAVIX) 75 MG tablet Take 1 tablet (75 mg total) by mouth daily at 6 (six) AM. 30 tablet 3  . glipiZIDE (GLUCOTROL XL) 10 MG 24 hr tablet     . hydrALAZINE (APRESOLINE) 25 MG tablet Take 1 tablet (25 mg total) by mouth every 8 (eight) hours. 90 tablet 1  . hydrochlorothiazide (HYDRODIURIL) 25 MG tablet Take 25 mg by mouth daily.    . insulin NPH-regular Human (NOVOLIN 70/30) (70-30) 100 UNIT/ML injection     . ipratropium-albuterol (DUONEB) 0.5-2.5 (3) MG/3ML SOLN Inhale 3 mLs into the lungs every 6 (six) hours as needed (for shortness of breath).     . losartan (COZAAR) 100 MG tablet Take 1 tablet (100 mg total) by mouth daily. 30 tablet 1  . nicotine polacrilex (NICORETTE) 2 MG gum Take 1 each (2 mg total) by mouth as needed for smoking cessation. 100 tablet 0  . oxyCODONE-acetaminophen (PERCOCET/ROXICET) 5-325 MG tablet Take 1 tablet by mouth every 6 (six) hours as needed for moderate pain. 20 tablet 0  . pantoprazole (PROTONIX) 40 MG tablet TAKE 1 TABLET BY MOUTH TWICE DAILY BEFORE A MEAL. (Patient taking differently: Take 40 mg by mouth 2 (two) times daily before a meal.) 60 tablet 3  . rosuvastatin (CRESTOR) 40 MG tablet Take 1 tablet (40 mg total) by mouth at bedtime. 90 tablet 3  . simvastatin (ZOCOR) 20 MG tablet     .  carvedilol (COREG) 25 MG tablet Take 1 tablet (25 mg total) 2 (two) times daily by mouth. (Patient taking differently: Take 25 mg by mouth 2 (two) times daily. 1 1/2 tabs BID) 180 tablet 3  . insulin lispro (HUMALOG) 100 UNIT/ML injection Inject 111 Units into the skin. Via pump 111 units daily (Patient not taking: Reported on 04/14/2020)     No current facility-administered medications for this visit.    REVIEW OF SYSTEMS:  _0  denotes positive finding, _1  denotes negative finding Cardiac  Comments:  Chest pain or chest pressure:    Shortness of breath upon exertion:    Short of breath when lying flat:    Irregular heart rhythm:        Vascular    Pain in calf, thigh, or hip brought on by ambulation:    Pain in feet  at night that wakes you up from your sleep:     Blood clot in your veins:    Leg swelling:         Pulmonary    Oxygen at home:    Productive cough:     Wheezing:         Neurologic    Sudden weakness in arms or legs:     Sudden numbness in arms or legs:     Sudden onset of difficulty speaking or slurred speech:    Temporary loss of vision in one eye:     Problems with dizziness:         Gastrointestinal    Blood in stool:     Vomited blood:         Genitourinary    Burning when urinating:     Blood in urine:        Psychiatric    Major depression:         Hematologic    Bleeding problems:    Problems with blood clotting too easily:        Skin    Rashes or ulcers:        Constitutional    Fever or chills:      PHYSICAL EXAM: Vitals:   04/14/20 1220  BP: (!) 162/75  Pulse: 67  Resp: 14  Temp: 98.1 F (36.7 C)  TempSrc: Temporal  SpO2: 95%  Weight: 214 lb (97.1 kg)  Height: _0  (1.626 m)    GENERAL: The patient is a well-nourished female, in no acute distress. The vital signs are documented above. CARDIAC: There is a regular rate and rhythm.  VASCULAR:  Right groin incision well healed Right femoral pulse palpable DP PT monophasic  signals at the right ankle, no tissue loss   DATA:   Right Iliac stents appear patent on duplex today but difficult to interpret.  Assessment/Plan:  70 year old female with complex history of revascularization of bilateral lower extremities.  She presents for postop check after recent right common femoral endarterectomy with profundoplasty and retrograde right common and external iliac stents on 02/24/2020 for CLI with rest pain.  She has a nice palpable femoral pulse today and her right groin is healed nicely.  She has monophasic flow at the right ankle with DP/PT signals.  I think she is doing quite well and is certainly much better after revascularization.  She does describe some ongoing numbness in the toes and I did prescribe her 300 mg Neurontin at nighttime to see if this provides some relief.  I will see her in 3 months with right aortoiliac duplex and ABIs.   Marty Heck, MD [facility] Office: [phone]

## 2020-04-16 ENCOUNTER — Other Ambulatory Visit: Payer: Self-pay

## 2020-04-16 DIAGNOSIS — I739 Peripheral vascular disease, unspecified: Secondary | ICD-10-CM

## 2020-04-17 ENCOUNTER — Other Ambulatory Visit: Payer: Self-pay | Admitting: *Deleted

## 2020-04-17 ENCOUNTER — Other Ambulatory Visit: Payer: Self-pay

## 2020-04-17 DIAGNOSIS — M545 Low back pain, unspecified: Secondary | ICD-10-CM | POA: Diagnosis not present

## 2020-04-17 DIAGNOSIS — M4310 Spondylolisthesis, site unspecified: Secondary | ICD-10-CM | POA: Diagnosis not present

## 2020-04-17 NOTE — Patient Outreach (Addendum)
[facility]) Care Management  04/17/2020  Katherine Williams October 06, 1950 [id number]   THN follow up for complex care patient  Katherine Williams was referred to [facility] for EMMI general discharge services related to transportation to follow up and loss of interest in things with symptoms of sad/hopeless/anxious/empty. These concerns were resolved  She is being followed for complex care services  Insurance:United Health care (UHC)medicare advantage AARP  [facility] x1ED visits x 1in the last 6 months  Admission 02/19/20 to 02/27/20 s/p R CFA endarterectomy with common and external iliac artery stenting  [facility]  Successful follow up outreach  Patient is able to verify HIPAA [facility] Portability and Accountability Act) identifiers Reviewed and addressed the purpose of the follow up call with the patient  Consent: [facility]) RN CM reviewed [facility] services with patient. Patient gave verbal consent for services.  Follow transportation to MD services/update   Surgery follow up appointment/transportation Katherine [name] confirms she did attend her follow up appointment with Dr Chestine Spore  She reports the transportation services was appreciated and went well without issues. She reports she now familiar with the location of Dr Chestine Spore office and on her next appointment she is hoping to be able to drive herself to the appointment She confirms numbness in the toes and was prescribed 300 mg Neurontin. She states today that the Neurontin has already begun to resolve the numbness   Back pain  Saw orthopedic MD, Chinita Greenland in [place] with transportation assistance from her daughter today to discussed treatment for her back   Housing  She reports she is trying to relocate and will go to the [employer] senior center for possible assistance for housing She reports not being able to continue to afford paying  $1500/month for housing Pt will updated [facility] RN CM later on any possible need of assistance with housing   Hypertension (HTN) took back BP machine, frustrated her 04/14/20 BP 162/75 during office visit  Diabetes (DM)   CBG values < 200 but having hypoglycemia episodes daily per pt She reports adjusting her dm pump  hypoglycemia r/t eating habits still no taste for  Encourage ENT Lost wt 16 ht 5'4" 214 lbs BMI 36.72   Patient Active Problem List   Diagnosis Date Noted  . Gastroesophageal reflux disease with esophagitis 03/03/2020  . Long term (current) use of insulin (HCC) 03/03/2020  . Personal history of colonic polyps 03/03/2020  . Microcytic anemia 02/22/2020  . Morbid obesity (HCC) 02/20/2020  . PVD (peripheral vascular disease) with claudication (HCC) 02/19/2020  . Class 2 severe obesity due to excess calories with serious comorbidity and body mass index (BMI) of 38.0 to 38.9 in adult (HCC) 08/29/2016  . Chronic diastolic heart failure (HCC) 06/08/2015  . Claudication (HCC) 05/07/2015  . Pain in the chest   . Chest pain   . DM type 2 causing vascular disease (HCC)   . Hypertensive urgency 02/24/2015  . Hypokalemia 02/24/2015  . Elevated troponin 02/24/2015  . Asthma exacerbation 02/23/2015  . COPD exacerbation (HCC) 02/23/2015  . LEG PAIN, BILATERAL 05/25/2006  . CHEST PAIN, ATYPICAL, HX OF 02/23/2006  . Mixed hyperlipidemia 12/21/2005  . Current smoker 12/21/2005  . SYNDROME, RESTLESS LEGS 12/21/2005  . SYNDROME, CARPAL TUNNEL 12/21/2005  . Essential hypertension 12/21/2005  . PERIPHERAL VASCULAR DISEASE 12/21/2005  . GERD 12/21/2005  . OSTEOARTHRITIS 12/21/2005  . HIP PAIN, CHRONIC 12/21/2005    Plan Patient agrees  to care plan  [facility] RN CM will follow up with patient within the next 30 business days Goals      Patient Stated   .  [facility] [employer] [facility]) Find Help in My Community (pt-stated)      Timeframe:  Short-Term Goal Priority:  High Start Date:          03/06/20                    Expected End Date:  04/28/20                     Follow Up Date 05/14/20   - begin a notebook of services in my neighborhood or community - follow-up on any referrals for help I am given - have a back-up plan - make a list of family or friends that I can call    Notes:  04/17/20 Confirms use of [employer] transportation services without difficulty to appointment to Dr Chestine Spore. Daughter continues to assist with local transportation services for local appointments She hopes to be able to drive herself to her next non local appointment but is aware of how to schedule transportation with [facility]  04/09/20 worked with [facility] RN CM to schedule transportation for 04/14/20 vascular surgery follow up appointment voiced understanding of education of [name] transportation services 03/06/20 after consulting with Dr [name] RN, [name] Pt prefer to change appt to 04/14/20 1045-11 and use [employer] transportation to get to appointment Conference with her to [facility] transportation [employer]ayer [employer]) Education on transportation benefit provided     .  [facility]) Track and Manage My Blood Pressure-Hypertension (pt-stated)      Timeframe:  Short-Term Goal Priority:  Medium Start Date:                             Expected End Date:                       Follow Up Date 04/17/20   - check blood pressure weekly - choose a place to take my blood pressure (home, clinic or office, retail store)   Notes:         [name] L. [name], RN, BSN, CCM [facility] Telephonic Care Management Care Coordinator Office number [phone] Main [facility] number [phone] Fax number [phone]

## 2020-04-30 ENCOUNTER — Other Ambulatory Visit (HOSPITAL_BASED_OUTPATIENT_CLINIC_OR_DEPARTMENT_OTHER): Payer: Self-pay | Admitting: Orthopedic Surgery

## 2020-04-30 DIAGNOSIS — M4310 Spondylolisthesis, site unspecified: Secondary | ICD-10-CM

## 2020-05-09 ENCOUNTER — Encounter (HOSPITAL_BASED_OUTPATIENT_CLINIC_OR_DEPARTMENT_OTHER): Payer: Self-pay

## 2020-05-09 ENCOUNTER — Ambulatory Visit (HOSPITAL_BASED_OUTPATIENT_CLINIC_OR_DEPARTMENT_OTHER)
Admission: RE | Admit: 2020-05-09 | Discharge: 2020-05-09 | Disposition: A | Discharge status: Home / Self Care | Payer: Medicare Other | Source: Ambulatory Visit | Attending: Orthopedic Surgery | Admitting: Orthopedic Surgery

## 2020-05-09 ENCOUNTER — Other Ambulatory Visit: Payer: Self-pay

## 2020-05-09 DIAGNOSIS — M4310 Spondylolisthesis, site unspecified: Secondary | ICD-10-CM

## 2020-05-14 ENCOUNTER — Other Ambulatory Visit: Payer: Self-pay | Admitting: *Deleted

## 2020-05-14 ENCOUNTER — Other Ambulatory Visit: Payer: Self-pay

## 2020-05-14 NOTE — Patient Outreach (Addendum)
Katherine Williams) Care Management  05/14/2020  Katherine Williams 01-16-1951 [id number]   THNfollow up forcomplex care patient  [name] was referred to [facility] for EMMI general discharge services related to transportation to follow up and loss of interest in things with symptoms of sad/hopeless/anxious/empty.These concerns were resolved  She is being followed for complex care services  Insurance:United Health care (UHC)medicare advantage AARP  [facility] admissions x1ED visits x 1in the last 6 months  Admission 02/19/20 to 02/27/20 s/p R CFA endarterectomy with common and external iliac artery stenting  Discharged with [place] [facility] PT/OT  Successful follow up outreach Patient is able to verify HIPAA ([place]) identifiers Reviewed and addressed the purpose of the follow up call with the patient  Consent: [facility]) RN CM reviewed [place] with patient. Patient gave verbal consent for services.  Follow up assessment  Reports she feels fair today but has has a dry non productive cough, runny nose with head congestion, noted with frequent clearing of her throat during outreach no chest congestion per pt  Keeping hydrated with juice and water  She continues to smokes < or =1/2 ppd  She reports smoking in only one room in her home Patches too expensive per pt, nicorette gum use prn  No carpet, or wood burning devices  She reports not being able to cook on her electric stove as it she is not able to stand up long enough Encounter cleaning and using her nebulizer  Answered questions related to the importance and process of cleaning the nebulizer  She states she will complete this today    back pain unresolved Pt does not want to do another MRI  She reports she is claustrophobic but went [place] [facility] [place] to attempt to complete an open MRI She reports this was not an open  MRI device Was not able to have it completed  This per pt is the second MRI she has been scheduled to have unsuccessfully Encouraged her to speak with orthopedic MD, Katherine Williams in [place] about other options She reports completing a CT in the hospital without issues  Housing holding off still pending completion of her application process    Hypertension (HTN) Prefers wrist BP cuffs vs electronic arm monitors  will try to get one from [place]  Has not check her BP  Diabetes  CBG 169 Freestyle  Aware of elevations of CBG related to respiratory illnesses  Past Medical History:  Diagnosis Date  . CHF (congestive heart failure) ([place])   . COPD (chronic obstructive pulmonary disease) ([place])   . Diabetes mellitus without complication ([place])   . Ganglion cyst   . Headache   . Heart murmur   . Hypertension   . Insulin pump in place   . Neuropathy in diabetes ([place])   . Peripheral vascular disease ([place])    a. prior stenting, right Fem-Pop bypass graft b. 04/2015: s/p PTA and stenting of R external iliac  . Respiratory failure ([place])   . Shortness of breath dyspnea    Current Outpatient Medications on File Prior to Visit  Medication Sig Dispense Refill  . albuterol (PROVENTIL) (2.5 MG/3ML) 0.083% nebulizer solution     . aspirin 81 MG EC tablet Take 1 tablet by mouth daily.    . Blood Glucose Monitoring Suppl (TRUE METRIX METER) w/Device KIT     . rosuvastatin (CRESTOR) 40 MG tablet Take 1 tablet (40  mg total) by mouth at bedtime. 90 tablet 3  . amLODipine (NORVASC) 10 MG tablet Take 1 tablet (10 mg total) by mouth daily. 30 tablet 1  . carvedilol (COREG) 25 MG tablet Take 1 tablet (25 mg total) 2 (two) times daily by mouth. (Patient taking differently: Take 25 mg by mouth 2 (two) times daily. 1 1/2 tabs BID) 180 tablet 3  . clopidogrel (PLAVIX) 75 MG tablet Take 1 tablet (75 mg total) by mouth daily at 6 (six) AM. 30 tablet 3  . gabapentin (NEURONTIN) 300 MG capsule Take 1 capsule  (300 mg total) by mouth at bedtime. 30 capsule 5  . glipiZIDE (GLUCOTROL XL) 10 MG 24 hr tablet  (Patient not taking: Reported on 05/14/2020)    . hydrALAZINE (APRESOLINE) 25 MG tablet Take 1 tablet (25 mg total) by mouth every 8 (eight) hours. 90 tablet 1  . hydrochlorothiazide (HYDRODIURIL) 25 MG tablet Take 25 mg by mouth daily.    . insulin lispro (HUMALOG) 100 UNIT/ML injection Inject 111 Units into the skin. Via pump 111 units daily (Patient not taking: Reported on 04/14/2020)    . insulin NPH-regular Human (NOVOLIN 70/30) (70-30) 100 UNIT/ML injection     . ipratropium-albuterol (DUONEB) 0.5-2.5 (3) MG/3ML SOLN Inhale 3 mLs into the lungs every 6 (six) hours as needed (for shortness of breath).     . losartan (COZAAR) 100 MG tablet Take 1 tablet (100 mg total) by mouth daily. 30 tablet 1  . nicotine polacrilex (NICORETTE) 2 MG gum Take 1 each (2 mg total) by mouth as needed for smoking cessation. 100 tablet 0  . oxyCODONE-acetaminophen (PERCOCET/ROXICET) 5-325 MG tablet Take 1 tablet by mouth every 6 (six) hours as needed for moderate pain. 20 tablet 0  . pantoprazole (PROTONIX) 40 MG tablet TAKE 1 TABLET BY MOUTH TWICE DAILY BEFORE A MEAL. (Patient taking differently: Take 40 mg by mouth 2 (two) times daily before a meal.) 60 tablet 3  . simvastatin (ZOCOR) 20 MG tablet      No current facility-administered medications on file prior to visit.     Plans  THN RN CM (Katherine Williams) will outreach to patient with in the next 30 business days Pt encouraged to return a call to [place] CM prn   Katherine Paquette L. Lavina Hamman, RN, BSN, [place] Coordinator Office number [phone] Main [facility] number [phone] Fax number [phone]

## 2020-05-19 DIAGNOSIS — E1165 Type 2 diabetes mellitus with hyperglycemia: Secondary | ICD-10-CM | POA: Diagnosis not present

## 2020-05-19 DIAGNOSIS — E119 Type 2 diabetes mellitus without complications: Secondary | ICD-10-CM | POA: Diagnosis not present

## 2020-05-28 ENCOUNTER — Other Ambulatory Visit: Payer: Self-pay | Admitting: *Deleted

## 2020-05-28 ENCOUNTER — Other Ambulatory Visit: Payer: Self-pay

## 2020-05-28 NOTE — Patient Outreach (Signed)
[facility]) Care Management  05/28/2020  LONNETTA KNISKERN 04-10-1950 [id number]   THNfollow up forcomplex care patient  Mrs LANA FLAIM was referred to [facility] for EMMI general discharge services related to transportation to follow up and loss of interest in things with symptoms of sad/hopeless/anxious/empty.These concerns were resolved  She is being followed for complex care services  Insurance:United Health care (UHC)medicare advantage AARP   [facility] x1ED visits x 1in the last 6 months  Admission 02/19/20 to 02/27/20 s/p R CFA endarterectomy with common and external iliac artery stenting  [facility] [place] [facility]  Successful follow up outreach Patient is able to verify HIPAA [facility] Portability and Accountability Act) identifiers Reviewed and addressed the purpose of the follow up call with the patient  Consent: [facility]) RN CM reviewed [facility] services with patient. Patient gave verbal consent for services.  Follow up assessment Pt reports she has a Head cold that is better after using mucinex   Back pain  Returning to see Dr Scot Jun Tuesday 06/02/20 1330 Not being able to sleep at night Back gets a "cold rush to it" had to get to foot of bed without clothing to get relief per pt  Can sleep better on couch during the day than in her bed at night related to back pain   "Jumping feeling" with rest  numbness when walking  Shooting pain or spasms go through her back for about 5 minutes per pt  Fell in her 59's, and it was identified she had a pinch nerve and a slip disc in her neck Had a successful open MRI only in [place]   Has not tried heat but did ice- cooling cream Can bring knee up to chest lying flat  Some nausea no emesis   Reports she is not mobile except in her home   Daughter is doing all errands and Home decluttering  She refuses to go through the process of another  MRI   Appetite poor still taking Glucerna Discussed use of lavender and peppermint essential oil  Social Pt disabled [place] daughter will visit this weekend to assist in her care   Plan [facility] RN CM ([facility]) will outreach to patient with in the next 30 business days Pt encouraged to return a call to [facility] RN CM prn  Adrea Sherpa L. [name], RN, BSN, CCM [facility] Telephonic Care Management Care Coordinator Office number [phone] Main [facility] number [phone] Fax number [phone]

## 2020-06-10 ENCOUNTER — Other Ambulatory Visit (HOSPITAL_COMMUNITY): Payer: Self-pay | Admitting: Orthopedic Surgery

## 2020-06-10 ENCOUNTER — Telehealth (HOSPITAL_COMMUNITY): Payer: Self-pay

## 2020-06-10 DIAGNOSIS — M4310 Spondylolisthesis, site unspecified: Secondary | ICD-10-CM

## 2020-06-11 ENCOUNTER — Other Ambulatory Visit: Payer: Self-pay

## 2020-06-11 ENCOUNTER — Other Ambulatory Visit: Payer: Self-pay | Admitting: *Deleted

## 2020-06-11 NOTE — Patient Outreach (Signed)
[facility]) Care Management  06/11/2020  Katherine Williams 05/10/1950 [id number]  THNfollow up forcomplex carepatient  Katherine Williams was referred to [facility] for EMMI general discharge servicesrelated to transportation to follow up and loss of interest in things with symptoms of sad/hopeless/anxious/empty.These concerns were resolved  She is being followed for complex care services  Insurance:United Health care (UHC)medicare advantage AARP   [facility] x1ED visits x 1in the last 6 months  Admission 02/19/20 to 02/27/20 s/p R CFA endarterectomy with common and external iliac artery stenting  [facility]  Successful follow up outreach Katherine Williams is able to verify HIPAA [facility] Portability and Accountability Act) identifiers Reviewed and addressed the purpose of the follow up call with the Katherine Williams  Consent: [facility]) RN CM reviewed [facility] services with Katherine Williams. Katherine Williams gave verbal consent for services.  Follow up assessment  Flu symptoms Pt is to receive a return call from her pcp NP today  She reports feeling better but is continuing to have a loss of taste and "feels like food swell in my mouth" Her daughter continues with grocery shopping and errands for her  She inquired if united healthcare medicare assists with the cost of house keeping She reports having difficulty keeping her kitchen declutter and thoroughly clean  Sent via e-mail a list of cleaning and decluttering services Back pain  She reports she feels better  [facility]    Plan [facility] RN CM ([facility]/Case Manager)will outreach to Katherine Williams with in the next 30 business days Pt encouraged to return a call to [facility] RN CM prn   Vershawn Westrup L. [name], RN, BSN, CCM [facility] Telephonic Care Management Care Coordinator Office number [phone] Main [facility] number  [phone] Fax number [phone]

## 2020-06-12 ENCOUNTER — Telehealth (HOSPITAL_COMMUNITY): Payer: Self-pay

## 2020-06-30 ENCOUNTER — Telehealth: Payer: Self-pay

## 2020-06-30 NOTE — Telephone Encounter (Signed)
Pt called with c/o RLE rest pain that has been ongoing for months since before surgery and is worsening as of last few weeks. I have offered her a sooner appt with MD and ABI, per APP but pt declined to come next week due to lack of transportation. She said her daughter will bring her but can't until the following week. Pt has been scheduled and is aware. She will call us back if anything changes/worsens.

## 2020-07-01 ENCOUNTER — Other Ambulatory Visit: Payer: Self-pay

## 2020-07-01 DIAGNOSIS — I739 Peripheral vascular disease, unspecified: Secondary | ICD-10-CM

## 2020-07-03 ENCOUNTER — Other Ambulatory Visit (HOSPITAL_COMMUNITY): Payer: Self-pay | Admitting: Internal Medicine

## 2020-07-06 ENCOUNTER — Ambulatory Visit (HOSPITAL_COMMUNITY): Payer: Medicare Other

## 2020-07-06 DIAGNOSIS — M545 Low back pain, unspecified: Secondary | ICD-10-CM | POA: Diagnosis not present

## 2020-07-06 DIAGNOSIS — M4310 Spondylolisthesis, site unspecified: Secondary | ICD-10-CM | POA: Diagnosis not present

## 2020-07-07 DIAGNOSIS — Z743 Need for continuous supervision: Secondary | ICD-10-CM | POA: Diagnosis not present

## 2020-07-07 NOTE — Progress Notes (Signed)
No return call from [facility].

## 2020-07-07 NOTE — Progress Notes (Signed)
Pt is scheduled for a MRI with anesthesia on Thursday 07/09/20. The H&P that was faxed and found under the Media tab is out of date. I called [facility] office in [place], [place] that ordered the MRI and that sent the H&P yesterday.  I spoke with Asher Muir at the office and stated she would check in to this and would call me back. I have not heard from her yet and I have put in another call to [facility].

## 2020-07-08 ENCOUNTER — Encounter (HOSPITAL_COMMUNITY): Payer: Self-pay | Admitting: Anesthesiology

## 2020-07-08 ENCOUNTER — Encounter (HOSPITAL_COMMUNITY): Payer: Self-pay | Admitting: *Deleted

## 2020-07-08 ENCOUNTER — Other Ambulatory Visit: Payer: Self-pay

## 2020-07-08 NOTE — Anesthesia Preprocedure Evaluation (Deleted)
Anesthesia Evaluation    Airway        Dental   Pulmonary Current Smoker,           Cardiovascular hypertension,      Neuro/Psych    GI/Hepatic   Endo/Other  diabetes  Renal/GU      Musculoskeletal   Abdominal   Peds  Hematology   Anesthesia Other Findings   Reproductive/Obstetrics                             Anesthesia Physical Anesthesia Plan  ASA:   Anesthesia Plan:    Post-op Pain Management:    Induction:   PONV Risk Score and Plan:   Airway Management Planned:   Additional Equipment:   Intra-op Plan:   Post-operative Plan:   Informed Consent:   Plan Discussed with:   Anesthesia Plan Comments: (See PAT note written 07/08/2020 by Shonna Chock, PA-C. )        Anesthesia Quick Evaluation

## 2020-07-08 NOTE — Progress Notes (Addendum)
Called [facility] office in [place], [place] this morning at [phone].  I spoke with Amy requesting a current History and Physical for this patient's MRI procedure.  There is a H & P under medial tab that is out of date.  Informed Amy that we need a current H&P.  If we do not receive a current H&P, the patient could not have her surgery tomorrow.  Amy verbalized understanding.

## 2020-07-08 NOTE — Progress Notes (Signed)
No H&P received for 07/09/20 surgery.  I informed [name], Assist Director and [name], Charity fundraiser.  Called Radiology [phone], spoke with [place] to informed her of cancellation of the MRI procedure.Katherine Williams

## 2020-07-08 NOTE — Progress Notes (Signed)
Anesthesia Chart Review: Kathleene Hazel   Case: [id number] Date/Time: 07/09/20 0945   Procedure: MRI WITH LUMBER WITHOUT CONTRAST (N/A )   Anesthesia type: General   Pre-op diagnosis: SPONDYLOLISTHESIS   Location: [facility] OR RADIOLOGY ROOM / [place]   Surgeons: Radiologist, Medication, MD      DISCUSSION: Patient is a 70 year old female scheduled for the above procedure. MRI ordered by Nanine Means, MD (Orthopedist with [facility]; phone [phone]; fax [phone]). Appears she was unable to get an open MRI, so MRI scheduled under anesthesia care. Last encounter currently available (Media tab) from Dr. Lesly Dukes is dated 06/02/20. PAT RN staff have already reached out to his office indicating that patient with need updated H&P without 30 days of MRI.  History includes smoking, HTN, DM2 (with neuropathy), PAD (right FPBG 1997; left CFA endarterectomy with Dacron patch angioplasty, profundoplasty, left CFA-below knee popliteal artery bypass using 6 mm Gortex graft 08/02/04; PTA/stent right EIA 05/07/15; right CFA endarterectomy with profundoplasty and bovine pericardial patch angioplasty, angioplasty/stenting right CIA and right EIA 02/24/20), murmur (mild MR 2017), COPD, CHF, dyspnea, GERD, fibromyalgia, TAH/BSO (10/03/01). Low risk, no ischemia, but possible prior infarct on 2017 nuclear stress test. EF 43%, but 59% by echo. 61-60% LICA stenosis by 7371 carotid US.   - [facility] admission 02/19/20-02/27/20 for RLE rest pain with numbness/tingling with known prior RLE bypass and stenting. Work-up showed occluded right EIA stent and CFA. Vascular surgeon Dr. Carlis Abbott consulted and s/p redo right FPBG on 02/24/20 for critical ischemia. Discharge medication included ASA, Plavix, Crestor.  Pharmacy notes indicate medication non-compliance due to side effects, but has ben taking insulin.   Notes indicate COVID-19 booster done 01/14/20. Awaiting updated H&P. She tolerated redo RLE bypass within the past  six months. Anesthesia team to evaluate on the day of procedure.    VS:  BP Readings from Last 3 Encounters:  04/14/20 (!) 162/75  02/27/20 (!) 141/57  12/26/16 110/60   Pulse Readings from Last 3 Encounters:  04/14/20 67  02/27/20 65  12/26/16 64    PROVIDERS: Celene Squibb, MD is PCP  Monica Martinez, MD is vascular surgeon. Next visit scheduled 07/14/20.  Carlyle Dolly, MD is cardiologist. Last visit 11/30/16. Referred to GI for epigastric pain. B-blocker increased for palpitations and Coreg increased for HTN. Carotid US ordered for carotid artery disease. Two month follow-up had been planned, but did not occur. (She had also seen Quay Burow, MD and Croitoru, Mihai in 2017 for [facility] cardiology.)   LABS: For day of procedure as indicated. As of 02/26/20, H/H 9.8/32.1, Cr 1.14, glucose 147, A1c 8.0%.   IMAGES: CTA Ao+BiFem 02/21/20: IMPRESSION: VASCULAR 1. Severe right lower extremity peripheral arterial disease with at least moderate stenosis of the common iliac artery and total occlusion of the external iliac and common femoral arteries as well as the femoral to above the knee popliteal bypass graft. The popliteal and runoff arteries reconstitute via collateral flow. 2. Severe left lower extremity peripheral arterial disease with at least moderate stenosis of the common iliac artery and chronic occlusion of the femoral to above the knee popliteal bypass graft. 3. Probable peroneal continuation of the distal anterior tibial artery on the left. 4. 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 5. High-grade stenosis of the proximal left renal artery. 6. Moderate stenosis of the mid right renal artery. 7. Aortic Atherosclerosis (ICD10-I70.0). NON-VASCULAR 1. No acute abnormality  within the abdomen or pelvis. 2. Mild atrophy and delayed nephrogram of the left kidney consistent with chronic  ischemia. 3. Lower lumbar degenerative disc disease.   EKG: EKG 02/19/20: Sinus rhythm Borderline repolarization abnormality T wave abnormality Abnormal ECG Confirmed by Carmin Muskrat [phone]) on 02/20/2020 10:39:48 AM - Inferolateral T wave abnormality also present on 10/25/16/ tracing.   CV: US Aorta/Iliacs 04/14/20: Summary:  Abdominal Aorta: No evidence of an abdominal aortic aneurysm was  visualized. The largest aortic measurement is 2.0 cm. Technically limited  exam. The right common iliac artery and right external iliac artery stents  were unable to be clearly visualized,  but appear patent. The distal right CIA and proximal right EIA were not  visualized.    Cardiac event monitor 12/11/16: Study Highlights  7 day event monitor  Telemetry tracings show primarily sinus rhythm  Reported symptoms of fluttering associated with sinus rhythm with occasional PVCs   Carotid US 12/06/16: IMPRESSION: Moderate calcified plaque at the level of the left carotid bulb and proximal ICA with estimated left ICA stenosis of 50- 69%. Mild plaque at the level of the right carotid bulb and ICA with estimated right ICA stenosis of less than 50%.   Nuclear stress test 02/26/15:  There was no ST segment deviation noted during stress.  No T wave inversion was noted during stress.  Nuclear stress EF: 43%.  This is a low risk study.  The left ventricular ejection fraction is moderately decreased (30-44%). Low risk stress nuclear study with a small, moderate intensity, fixed inferior defect consistent with prior infarct; no ischemia; EF 43 but visually appears better; suggest echo to further assess; mild LVE; inferior hypokinesis. [LVEF 59% by 02/24/14 echo]   Echo 02/25/15: Study Conclusions  - Left ventricle: The cavity size was normal. Wall thickness was  increased in a pattern of moderate LVH. Systolic function was  normal. Wall motion was normal; there were no regional wall   motion abnormalities. Doppler parameters are consistent with  elevated ventricular end-diastolic filling pressure.  - Mitral valve: Eccentric MR directed anteriorly but no obvious  prolapse. There was mild regurgitation.  - Left atrium: The atrium was mildly dilated.  - Atrial septum: No defect or patent foramen ovale was identified.  - Pulmonary arteries: PA peak pressure: 43 mm Hg (S).   Past Medical History:  Diagnosis Date  . CHF (congestive heart failure) ([place])   . COPD (chronic obstructive pulmonary disease) ([place])   . Diabetes mellitus without complication ([place])   . Fibromyalgia   . Ganglion cyst   . GERD (gastroesophageal reflux disease)   . Headache   . Heart murmur   . Hypertension   . Insulin pump in place   . Neuropathy in diabetes ([place])   . Peripheral vascular disease ([place])    a. prior stenting, right Fem-Pop bypass graft b. 04/2015: s/p PTA and stenting of R external iliac  . Respiratory failure ([place])    yrs aog  . Shortness of breath dyspnea     Past Surgical History:  Procedure Laterality Date  . CARPAL TUNNEL RELEASE    . ENDARTERECTOMY FEMORAL Right 02/24/2020   Procedure: RIGHT FEMORAL ENDARTERECTOMY WITH PROFUNDAPLASTY;  Surgeon: Marty Heck, MD;  Location: [place];  Service: Vascular;  Laterality: Right;  . FEMORAL-FEMORAL BYPASS GRAFT     right and left legs  . ILIAC VEIN ANGIOPLASTY / STENTING Right 05/07/2015  . INSERTION OF ILIAC STENT Right 02/24/2020   Procedure: RETROGRADE STENTING OF RIGHT  COMMON AND EXTERNAL ILIAC ARTERIES;  Surgeon: Marty Heck, MD;  Location: [place];  Service: Vascular;  Laterality: Right;  . INTRAOPERATIVE ARTERIOGRAM Bilateral 02/24/2020   Procedure: ABDOMINAL AORTAGRAM WITH ILIAC ARTERIOGRAM;  Surgeon: Marty Heck, MD;  Location: [place];  Service: Vascular;  Laterality: Bilateral;  . MULTIPLE TOOTH EXTRACTIONS     dentures  . PATCH ANGIOPLASTY Right 02/24/2020   Procedure: PATCH ANGIOPLASTY OF  RIGHT FEMROAL ARTERY USING XENSOURE BOVINE PERICARDIUM PATCH;  Surgeon: Marty Heck, MD;  Location: [place];  Service: Vascular;  Laterality: Right;  . PERIPHERAL VASCULAR CATHETERIZATION N/A 05/07/2015   Procedure: Abdominal Aortogram;  Surgeon: Lorretta Harp, MD;  Location: [place] CV [facility];  Service: Cardiovascular;  Laterality: N/A;  . PERIPHERAL VASCULAR CATHETERIZATION Bilateral 05/07/2015   Procedure: Lower Extremity Angiography;  Surgeon: Lorretta Harp, MD;  Location: [place] CV [facility];  Service: Cardiovascular;  Laterality: Bilateral;  . PERIPHERAL VASCULAR CATHETERIZATION Right 05/07/2015   Procedure: Peripheral Vascular Intervention;  Surgeon: Lorretta Harp, MD;  Location: [place] CV [facility];  Service: Cardiovascular;  Laterality: Right;  ext iliac  . ULTRASOUND GUIDANCE FOR VASCULAR ACCESS Left 02/24/2020   Procedure: ULTRASOUND GUIDANCE FOR VASCULAR ACCESS, LEFT FEMORAL ARTERY;  Surgeon: Marty Heck, MD;  Location: [place];  Service: Vascular;  Laterality: Left;    MEDICATIONS: No current facility-administered medications for this encounter.   . hydrALAZINE (APRESOLINE) 25 MG tablet  . insulin aspart (NOVOLOG) 100 UNIT/ML injection  . Insulin Human (INSULIN PUMP) SOLN  . albuterol (PROVENTIL) (2.5 MG/3ML) 0.083% nebulizer solution  . amLODipine (NORVASC) 10 MG tablet  . aspirin 81 MG EC tablet  . Blood Glucose Monitoring Suppl (TRUE METRIX METER) w/Device KIT  . carvedilol (COREG) 25 MG tablet  . clopidogrel (PLAVIX) 75 MG tablet  . gabapentin (NEURONTIN) 300 MG capsule  . hydrochlorothiazide (HYDRODIURIL) 25 MG tablet  . ipratropium-albuterol (DUONEB) 0.5-2.5 (3) MG/3ML SOLN  . losartan (COZAAR) 100 MG tablet  . nicotine polacrilex (NICORETTE) 2 MG gum  . oxyCODONE-acetaminophen (PERCOCET/ROXICET) 5-325 MG tablet  . pantoprazole (PROTONIX) 40 MG tablet  . rosuvastatin (CRESTOR) 40 MG tablet    Myra Gianotti, PA-C Surgical Short  Stay/Anesthesiology [facility] Phone [phone] [facility] Phone [phone] 07/08/2020 2:45 PM

## 2020-07-09 ENCOUNTER — Ambulatory Visit (HOSPITAL_COMMUNITY): Admission: RE | Admit: 2020-07-09 | Payer: Medicare Other | Source: Ambulatory Visit

## 2020-07-09 ENCOUNTER — Encounter (HOSPITAL_COMMUNITY): Payer: Self-pay

## 2020-07-09 HISTORY — DX: Gastro-esophageal reflux disease without esophagitis: K21.9

## 2020-07-09 HISTORY — DX: Fibromyalgia: M79.7

## 2020-07-09 SURGERY — MRI WITH ANESTHESIA
Anesthesia: General

## 2020-07-14 ENCOUNTER — Ambulatory Visit (HOSPITAL_COMMUNITY): Payer: Medicare Other | Attending: Vascular Surgery

## 2020-07-14 ENCOUNTER — Ambulatory Visit: Payer: Medicare Other | Admitting: Vascular Surgery

## 2020-07-15 ENCOUNTER — Other Ambulatory Visit: Payer: Self-pay | Admitting: *Deleted

## 2020-07-15 DIAGNOSIS — 419620001 Death: Secondary | SNOMED CT | POA: Diagnosis not present

## 2020-07-15 NOTE — Patient Outreach (Signed)
[facility]) Care Management  07/15/2020  Katherine Williams [birth year] [id number]   [facility] Case closure - Patient deceased   [facility] RN CM outreached to [phone] Katherine Williams's daughter, Katherine Williams answered Katherine Williams informed [facility] RN CM when [facility] RN CM asked to speak with Katherine Williams, that Katherine Williams had passed "two days before" she was scheduled for a MRI on 07/09/20 Condolence, services and prayers offered to Katherine Williams voiced appreciation but reports nothing is needed at this time She was updated that [facility] RN CM would updated the primary care provider (PCP) and [place] system. She voiced appreciation     [facility] RN CM will close case-patient confirmed deceased by her daughter, Katherine Williams Case closure letter sent to MD  Katherine Bradford L. [name], RN, BSN, CCM [facility] Telephonic Care Management Care Coordinator Office number [phone] Mobile number [phone]  Main [facility] number [phone] Fax number [phone]

## 2020-07-15 DEATH — deceased

## 2020-07-21 ENCOUNTER — Ambulatory Visit: Payer: Medicare Other | Admitting: Vascular Surgery

## 2020-07-21 ENCOUNTER — Encounter (HOSPITAL_COMMUNITY): Payer: Medicare Other

## 2021-07-14 IMAGING — DX DG LUMBAR SPINE COMPLETE 4+V
5 series · 5 of 5 positions shown · non-contrast
Comparison: None.

CLINICAL DATA: Low back pain with lower extremity numbness

EXAM:
LUMBAR SPINE - COMPLETE 4+ VIEW

[l-spine ap]
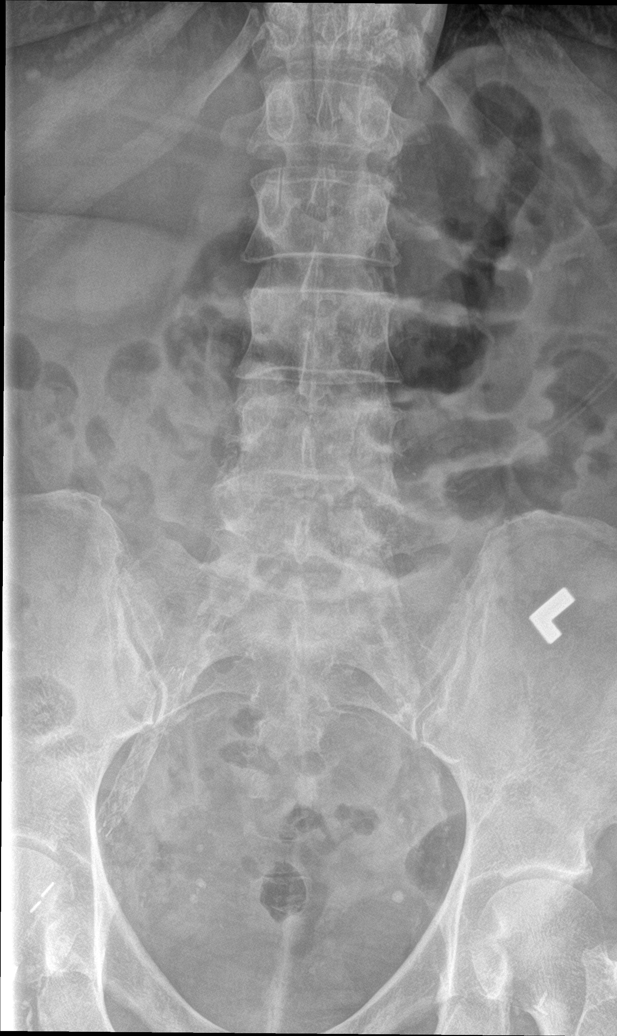

[l-spine obl (1 of 2)]
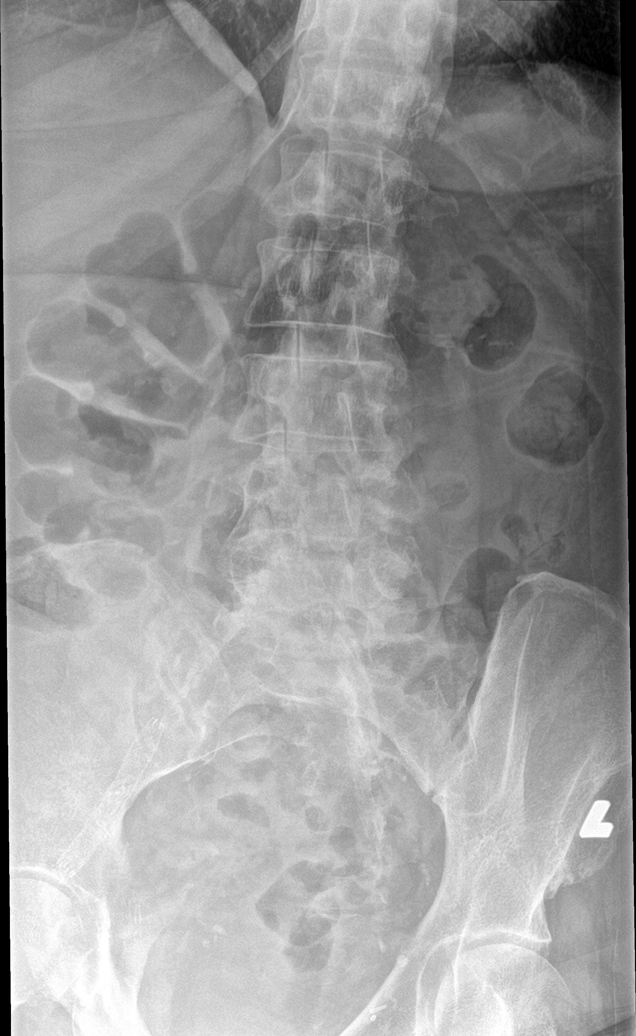

[l-spine obl (2 of 2)]
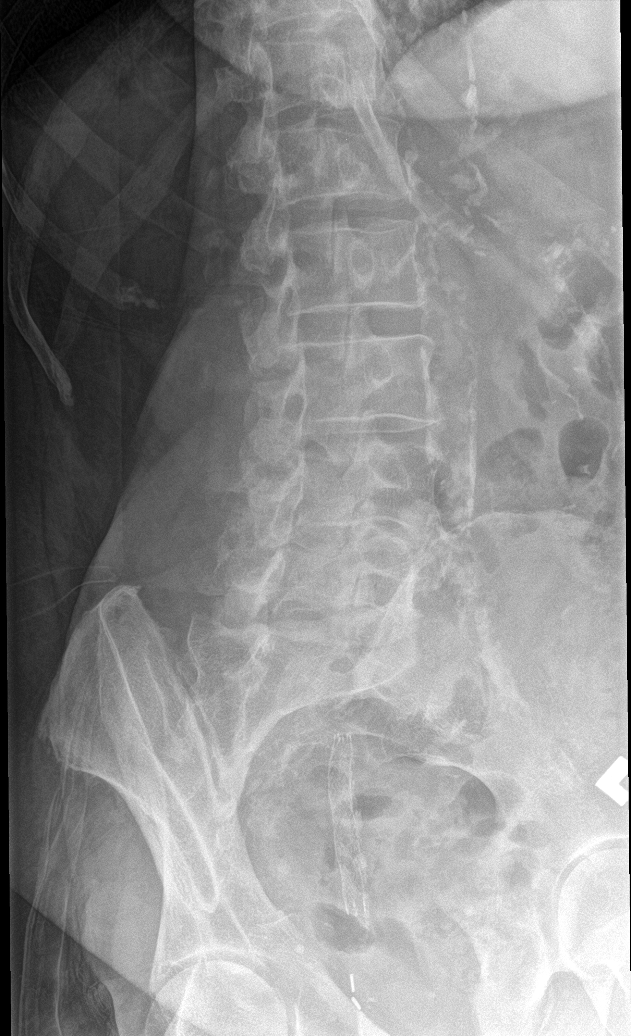

[l-spine lat]
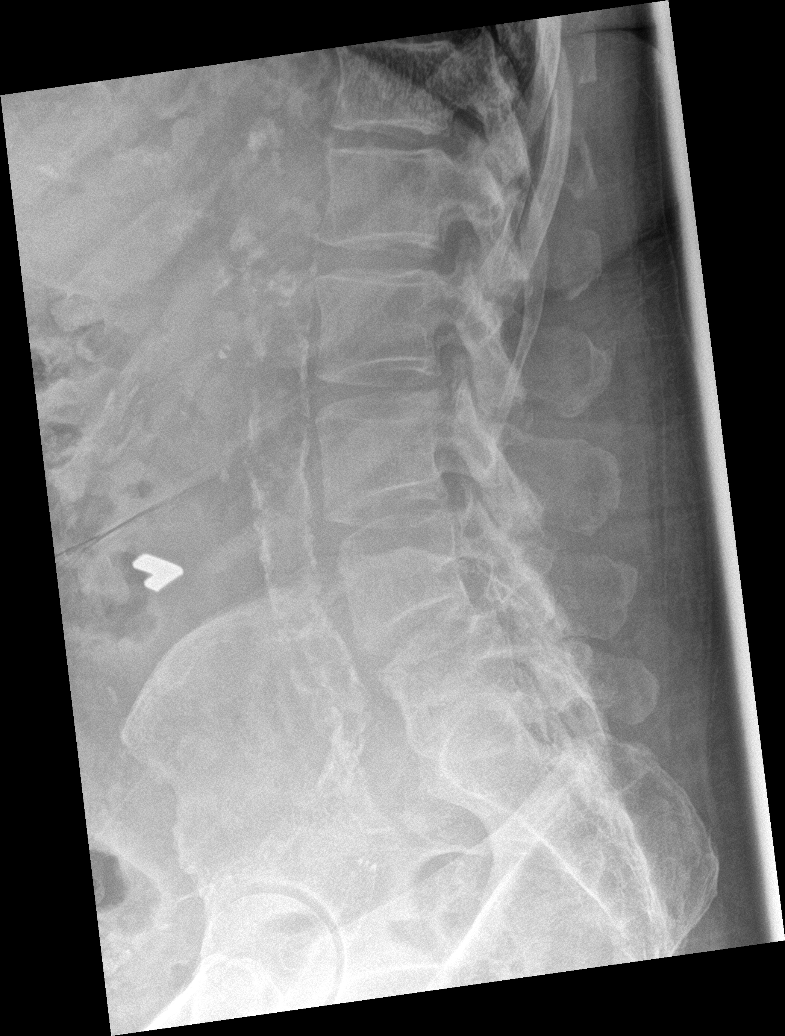

[l-spine spot]
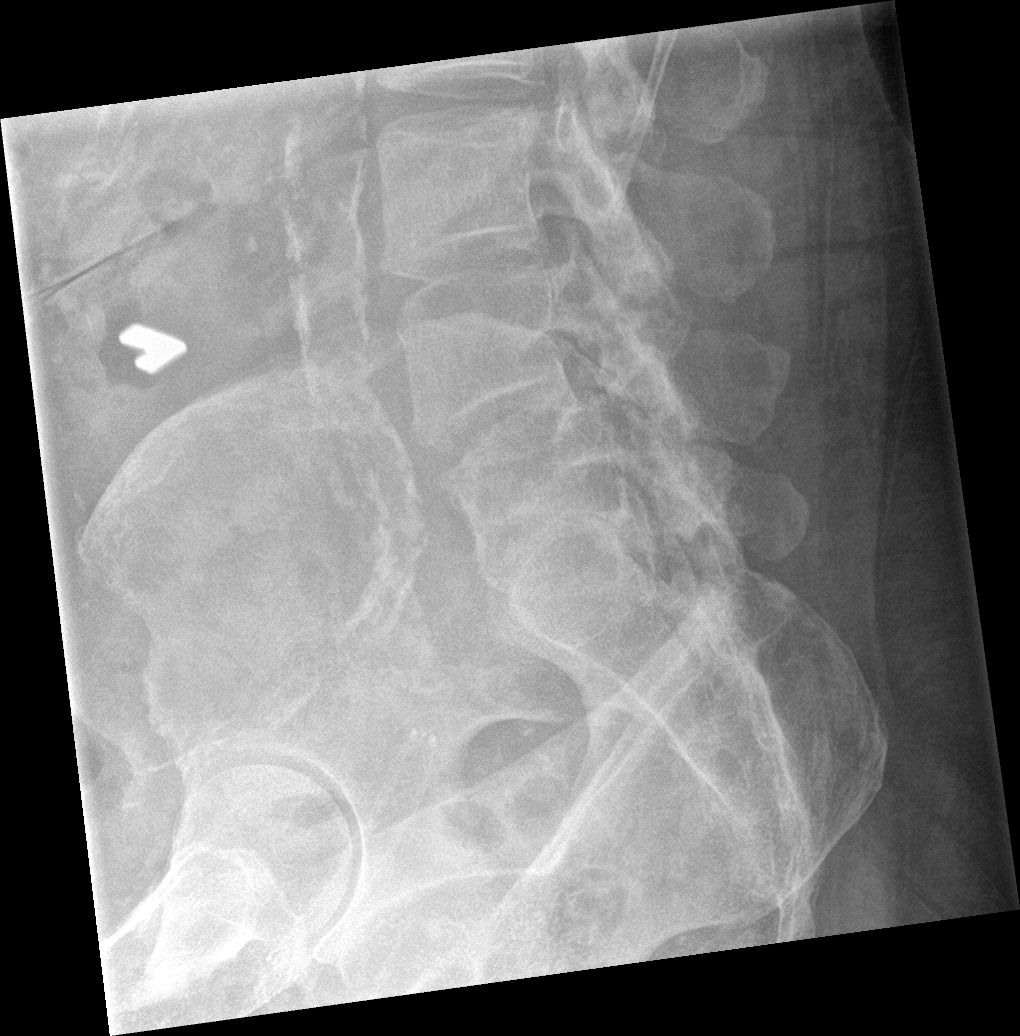

[5 of 5 positions shown; findings below may reference images not displayed]

FINDINGS: Frontal, lateral, spot lumbosacral lateral, and bilateral oblique
views were obtained. There are 5 non-rib-bearing lumbar type
vertebral bodies. There is no fracture or spondylolisthesis. There
is moderately severe disc space narrowing at L5-S1 with milder disc
space narrowing at L3-4 and L4-5. There is facet osteoarthritic
change at L3-4, L4-5, and L5-S1 bilaterally. There is aortic
atherosclerosis. There is iliac artery atherosclerosis with a stent
in the right external iliac artery.
IMPRESSION: No fracture or spondylolisthesis. Facet osteoarthritic change at
L3-4, L4-5, and L5-S1 bilaterally. Disc space narrowing at L5-S1 and
to a lesser degree at L3-4 and L4-5.

Aortic Atherosclerosis ([id number]-GC7.7).
